# Patient Record
Sex: Female | Born: 1935 | Race: White | Hispanic: No | Smoking: Former smoker
Health system: Southern US, Community
[De-identification: ages and names within clinical notes are randomized; demographics above are authoritative.]

## PROBLEM LIST (undated history)

## (undated) DIAGNOSIS — R0600 Dyspnea, unspecified: Secondary | ICD-10-CM

## (undated) DIAGNOSIS — Z8489 Family history of other specified conditions: Secondary | ICD-10-CM

## (undated) DIAGNOSIS — J449 Chronic obstructive pulmonary disease, unspecified: Secondary | ICD-10-CM

## (undated) DIAGNOSIS — G5600 Carpal tunnel syndrome, unspecified upper limb: Secondary | ICD-10-CM

## (undated) DIAGNOSIS — E079 Disorder of thyroid, unspecified: Secondary | ICD-10-CM

## (undated) DIAGNOSIS — E871 Hypo-osmolality and hyponatremia: Secondary | ICD-10-CM

## (undated) DIAGNOSIS — M1990 Unspecified osteoarthritis, unspecified site: Secondary | ICD-10-CM

## (undated) DIAGNOSIS — L570 Actinic keratosis: Secondary | ICD-10-CM

## (undated) DIAGNOSIS — R202 Paresthesia of skin: Secondary | ICD-10-CM

## (undated) DIAGNOSIS — E039 Hypothyroidism, unspecified: Secondary | ICD-10-CM

## (undated) DIAGNOSIS — M316 Other giant cell arteritis: Secondary | ICD-10-CM

## (undated) DIAGNOSIS — I10 Essential (primary) hypertension: Secondary | ICD-10-CM

## (undated) DIAGNOSIS — Z87898 Personal history of other specified conditions: Secondary | ICD-10-CM

## (undated) DIAGNOSIS — J302 Other seasonal allergic rhinitis: Secondary | ICD-10-CM

## (undated) HISTORY — PX: NASAL POLYP SURGERY: SHX186

## (undated) HISTORY — PX: DILATION AND CURETTAGE OF UTERUS: SHX78

## (undated) HISTORY — PX: EYE SURGERY: SHX253

## (undated) HISTORY — DX: Actinic keratosis: L57.0

## (undated) HISTORY — PX: COLONOSCOPY: SHX174

## (undated) HISTORY — PX: TONSILLECTOMY: SUR1361

## (undated) HISTORY — PX: BREAST SURGERY: SHX581

## (undated) HISTORY — PX: APPENDECTOMY: SHX54

## (undated) HISTORY — PX: KNEE ARTHROSCOPY: SHX127

---
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
## 2004-11-24 ENCOUNTER — Ambulatory Visit: Payer: Self-pay

## 2004-12-02 ENCOUNTER — Ambulatory Visit: Payer: Self-pay

## 2005-07-18 ENCOUNTER — Ambulatory Visit: Payer: Self-pay | Admitting: Ophthalmology

## 2005-07-25 ENCOUNTER — Ambulatory Visit: Payer: Self-pay | Admitting: Ophthalmology

## 2005-11-25 ENCOUNTER — Ambulatory Visit: Payer: Self-pay

## 2005-12-13 ENCOUNTER — Ambulatory Visit: Payer: Self-pay

## 2006-06-14 ENCOUNTER — Ambulatory Visit: Payer: Self-pay | Admitting: General Surgery

## 2006-12-11 ENCOUNTER — Ambulatory Visit: Payer: Self-pay | Admitting: General Surgery

## 2007-04-14 ENCOUNTER — Ambulatory Visit: Payer: Self-pay | Admitting: General Practice

## 2007-05-10 ENCOUNTER — Ambulatory Visit: Payer: Self-pay | Admitting: Gastroenterology

## 2007-06-28 ENCOUNTER — Other Ambulatory Visit: Payer: Self-pay

## 2007-06-28 ENCOUNTER — Ambulatory Visit: Payer: Self-pay | Admitting: General Practice

## 2007-07-04 ENCOUNTER — Ambulatory Visit: Payer: Self-pay | Admitting: General Practice

## 2007-07-25 ENCOUNTER — Ambulatory Visit: Payer: Self-pay | Admitting: General Surgery

## 2008-01-23 ENCOUNTER — Ambulatory Visit: Payer: Self-pay | Admitting: General Surgery

## 2008-12-12 ENCOUNTER — Ambulatory Visit: Payer: Self-pay | Admitting: Ophthalmology

## 2008-12-22 ENCOUNTER — Ambulatory Visit: Payer: Self-pay | Admitting: Ophthalmology

## 2009-01-27 ENCOUNTER — Ambulatory Visit: Payer: Self-pay | Admitting: General Surgery

## 2009-03-04 ENCOUNTER — Ambulatory Visit: Payer: Self-pay

## 2009-07-17 ENCOUNTER — Encounter (INDEPENDENT_AMBULATORY_CARE_PROVIDER_SITE_OTHER): Payer: Self-pay | Admitting: *Deleted

## 2009-07-19 ENCOUNTER — Inpatient Hospital Stay: Payer: Self-pay | Admitting: Surgery

## 2010-04-01 ENCOUNTER — Ambulatory Visit: Payer: Self-pay | Admitting: Family Medicine

## 2010-09-24 ENCOUNTER — Ambulatory Visit: Payer: Self-pay | Admitting: Family Medicine

## 2011-06-09 ENCOUNTER — Ambulatory Visit: Payer: Self-pay | Admitting: Family Medicine

## 2012-05-08 ENCOUNTER — Ambulatory Visit: Payer: Self-pay | Admitting: Family Medicine

## 2012-12-25 ENCOUNTER — Ambulatory Visit: Payer: Self-pay | Admitting: Family Medicine

## 2013-09-27 ENCOUNTER — Ambulatory Visit: Payer: Self-pay | Admitting: Otolaryngology

## 2014-01-29 ENCOUNTER — Ambulatory Visit: Payer: Self-pay | Admitting: Family Medicine

## 2014-12-19 ENCOUNTER — Other Ambulatory Visit: Payer: Self-pay | Admitting: Family Medicine

## 2014-12-19 DIAGNOSIS — Z1231 Encounter for screening mammogram for malignant neoplasm of breast: Secondary | ICD-10-CM

## 2015-02-20 ENCOUNTER — Other Ambulatory Visit: Payer: Self-pay | Admitting: Family Medicine

## 2015-02-20 ENCOUNTER — Ambulatory Visit
Admission: RE | Admit: 2015-02-20 | Discharge: 2015-02-20 | Disposition: A | Discharge status: Home / Self Care | Payer: PPO | Source: Ambulatory Visit | Attending: Family Medicine | Admitting: Family Medicine

## 2015-02-20 DIAGNOSIS — Z1231 Encounter for screening mammogram for malignant neoplasm of breast: Secondary | ICD-10-CM

## 2015-09-21 DIAGNOSIS — E039 Hypothyroidism, unspecified: Secondary | ICD-10-CM | POA: Diagnosis not present

## 2015-09-21 DIAGNOSIS — I10 Essential (primary) hypertension: Secondary | ICD-10-CM | POA: Diagnosis not present

## 2015-09-21 DIAGNOSIS — M25511 Pain in right shoulder: Secondary | ICD-10-CM | POA: Diagnosis not present

## 2015-09-28 DIAGNOSIS — H02053 Trichiasis without entropian right eye, unspecified eyelid: Secondary | ICD-10-CM | POA: Diagnosis not present

## 2015-09-28 DIAGNOSIS — H1032 Unspecified acute conjunctivitis, left eye: Secondary | ICD-10-CM | POA: Diagnosis not present

## 2015-10-06 DIAGNOSIS — Z1283 Encounter for screening for malignant neoplasm of skin: Secondary | ICD-10-CM | POA: Diagnosis not present

## 2015-10-06 DIAGNOSIS — L570 Actinic keratosis: Secondary | ICD-10-CM | POA: Diagnosis not present

## 2015-10-06 DIAGNOSIS — L821 Other seborrheic keratosis: Secondary | ICD-10-CM | POA: Diagnosis not present

## 2015-10-06 DIAGNOSIS — C44321 Squamous cell carcinoma of skin of nose: Secondary | ICD-10-CM | POA: Diagnosis not present

## 2015-10-06 DIAGNOSIS — Z85828 Personal history of other malignant neoplasm of skin: Secondary | ICD-10-CM | POA: Diagnosis not present

## 2015-10-06 DIAGNOSIS — C44311 Basal cell carcinoma of skin of nose: Secondary | ICD-10-CM | POA: Diagnosis not present

## 2015-10-06 DIAGNOSIS — D1800 Hemangioma unspecified site: Secondary | ICD-10-CM | POA: Diagnosis not present

## 2015-10-06 DIAGNOSIS — L578 Other skin changes due to chronic exposure to nonionizing radiation: Secondary | ICD-10-CM | POA: Diagnosis not present

## 2015-10-06 DIAGNOSIS — L814 Other melanin hyperpigmentation: Secondary | ICD-10-CM | POA: Diagnosis not present

## 2015-10-06 DIAGNOSIS — D485 Neoplasm of uncertain behavior of skin: Secondary | ICD-10-CM | POA: Diagnosis not present

## 2015-10-06 DIAGNOSIS — L820 Inflamed seborrheic keratosis: Secondary | ICD-10-CM | POA: Diagnosis not present

## 2015-10-06 DIAGNOSIS — C4491 Basal cell carcinoma of skin, unspecified: Secondary | ICD-10-CM

## 2015-10-06 HISTORY — DX: Basal cell carcinoma of skin, unspecified: C44.91

## 2015-10-08 DIAGNOSIS — H01003 Unspecified blepharitis right eye, unspecified eyelid: Secondary | ICD-10-CM | POA: Diagnosis not present

## 2015-10-14 DIAGNOSIS — H353212 Exudative age-related macular degeneration, right eye, with inactive choroidal neovascularization: Secondary | ICD-10-CM | POA: Diagnosis not present

## 2015-10-14 DIAGNOSIS — H353122 Nonexudative age-related macular degeneration, left eye, intermediate dry stage: Secondary | ICD-10-CM | POA: Diagnosis not present

## 2015-11-30 DIAGNOSIS — N3946 Mixed incontinence: Secondary | ICD-10-CM | POA: Diagnosis not present

## 2015-11-30 DIAGNOSIS — R3915 Urgency of urination: Secondary | ICD-10-CM | POA: Diagnosis not present

## 2015-12-03 DIAGNOSIS — C44321 Squamous cell carcinoma of skin of nose: Secondary | ICD-10-CM | POA: Diagnosis not present

## 2015-12-16 DIAGNOSIS — H353212 Exudative age-related macular degeneration, right eye, with inactive choroidal neovascularization: Secondary | ICD-10-CM | POA: Diagnosis not present

## 2016-01-13 DIAGNOSIS — N3946 Mixed incontinence: Secondary | ICD-10-CM | POA: Diagnosis not present

## 2016-01-28 ENCOUNTER — Other Ambulatory Visit: Payer: Self-pay | Admitting: Family Medicine

## 2016-01-28 DIAGNOSIS — Z1231 Encounter for screening mammogram for malignant neoplasm of breast: Secondary | ICD-10-CM

## 2016-02-22 ENCOUNTER — Ambulatory Visit
Admission: RE | Admit: 2016-02-22 | Discharge: 2016-02-22 | Disposition: A | Discharge status: Home / Self Care | Payer: PPO | Source: Ambulatory Visit | Attending: Family Medicine | Admitting: Family Medicine

## 2016-02-22 ENCOUNTER — Other Ambulatory Visit: Payer: Self-pay | Admitting: Family Medicine

## 2016-02-22 DIAGNOSIS — Z1231 Encounter for screening mammogram for malignant neoplasm of breast: Secondary | ICD-10-CM

## 2016-02-25 DIAGNOSIS — N390 Urinary tract infection, site not specified: Secondary | ICD-10-CM | POA: Diagnosis not present

## 2016-02-26 DIAGNOSIS — R350 Frequency of micturition: Secondary | ICD-10-CM | POA: Diagnosis not present

## 2016-02-26 DIAGNOSIS — N3946 Mixed incontinence: Secondary | ICD-10-CM | POA: Diagnosis not present

## 2016-03-01 DIAGNOSIS — L821 Other seborrheic keratosis: Secondary | ICD-10-CM | POA: Diagnosis not present

## 2016-03-01 DIAGNOSIS — L578 Other skin changes due to chronic exposure to nonionizing radiation: Secondary | ICD-10-CM | POA: Diagnosis not present

## 2016-03-01 DIAGNOSIS — L570 Actinic keratosis: Secondary | ICD-10-CM | POA: Diagnosis not present

## 2016-03-01 DIAGNOSIS — Z85828 Personal history of other malignant neoplasm of skin: Secondary | ICD-10-CM | POA: Diagnosis not present

## 2016-03-11 DIAGNOSIS — D649 Anemia, unspecified: Secondary | ICD-10-CM | POA: Diagnosis not present

## 2016-03-18 DIAGNOSIS — H353122 Nonexudative age-related macular degeneration, left eye, intermediate dry stage: Secondary | ICD-10-CM | POA: Diagnosis not present

## 2016-03-18 DIAGNOSIS — H353211 Exudative age-related macular degeneration, right eye, with active choroidal neovascularization: Secondary | ICD-10-CM | POA: Diagnosis not present

## 2016-03-18 DIAGNOSIS — H02053 Trichiasis without entropian right eye, unspecified eyelid: Secondary | ICD-10-CM | POA: Diagnosis not present

## 2016-03-21 DIAGNOSIS — I10 Essential (primary) hypertension: Secondary | ICD-10-CM | POA: Diagnosis not present

## 2016-03-21 DIAGNOSIS — E039 Hypothyroidism, unspecified: Secondary | ICD-10-CM | POA: Diagnosis not present

## 2016-03-28 DIAGNOSIS — R0981 Nasal congestion: Secondary | ICD-10-CM | POA: Diagnosis not present

## 2016-03-28 DIAGNOSIS — Z0000 Encounter for general adult medical examination without abnormal findings: Secondary | ICD-10-CM | POA: Diagnosis not present

## 2016-03-28 DIAGNOSIS — M545 Low back pain: Secondary | ICD-10-CM | POA: Diagnosis not present

## 2016-03-28 DIAGNOSIS — I10 Essential (primary) hypertension: Secondary | ICD-10-CM | POA: Diagnosis not present

## 2016-04-07 DIAGNOSIS — R195 Other fecal abnormalities: Secondary | ICD-10-CM | POA: Diagnosis not present

## 2016-04-18 DIAGNOSIS — N3946 Mixed incontinence: Secondary | ICD-10-CM | POA: Diagnosis not present

## 2016-04-19 DIAGNOSIS — J301 Allergic rhinitis due to pollen: Secondary | ICD-10-CM | POA: Diagnosis not present

## 2016-04-19 DIAGNOSIS — H6123 Impacted cerumen, bilateral: Secondary | ICD-10-CM | POA: Diagnosis not present

## 2016-04-19 DIAGNOSIS — H903 Sensorineural hearing loss, bilateral: Secondary | ICD-10-CM | POA: Diagnosis not present

## 2016-05-06 DIAGNOSIS — R195 Other fecal abnormalities: Secondary | ICD-10-CM | POA: Diagnosis not present

## 2016-05-11 DIAGNOSIS — I10 Essential (primary) hypertension: Secondary | ICD-10-CM | POA: Diagnosis not present

## 2016-05-27 DIAGNOSIS — H353211 Exudative age-related macular degeneration, right eye, with active choroidal neovascularization: Secondary | ICD-10-CM | POA: Diagnosis not present

## 2016-05-31 DIAGNOSIS — K5900 Constipation, unspecified: Secondary | ICD-10-CM | POA: Diagnosis not present

## 2016-05-31 DIAGNOSIS — R198 Other specified symptoms and signs involving the digestive system and abdomen: Secondary | ICD-10-CM | POA: Diagnosis not present

## 2016-07-01 DIAGNOSIS — H353211 Exudative age-related macular degeneration, right eye, with active choroidal neovascularization: Secondary | ICD-10-CM | POA: Diagnosis not present

## 2016-07-14 DIAGNOSIS — R300 Dysuria: Secondary | ICD-10-CM | POA: Diagnosis not present

## 2016-08-05 DIAGNOSIS — H353211 Exudative age-related macular degeneration, right eye, with active choroidal neovascularization: Secondary | ICD-10-CM | POA: Diagnosis not present

## 2016-08-08 DIAGNOSIS — R42 Dizziness and giddiness: Secondary | ICD-10-CM | POA: Diagnosis not present

## 2016-08-08 DIAGNOSIS — H8111 Benign paroxysmal vertigo, right ear: Secondary | ICD-10-CM | POA: Diagnosis not present

## 2016-08-11 DIAGNOSIS — I10 Essential (primary) hypertension: Secondary | ICD-10-CM | POA: Diagnosis not present

## 2016-08-11 DIAGNOSIS — R42 Dizziness and giddiness: Secondary | ICD-10-CM | POA: Diagnosis not present

## 2016-08-15 DIAGNOSIS — H8111 Benign paroxysmal vertigo, right ear: Secondary | ICD-10-CM | POA: Diagnosis not present

## 2016-08-15 DIAGNOSIS — R278 Other lack of coordination: Secondary | ICD-10-CM | POA: Diagnosis not present

## 2016-08-15 DIAGNOSIS — R42 Dizziness and giddiness: Secondary | ICD-10-CM | POA: Diagnosis not present

## 2016-08-16 DIAGNOSIS — H8111 Benign paroxysmal vertigo, right ear: Secondary | ICD-10-CM | POA: Diagnosis not present

## 2016-08-16 DIAGNOSIS — R42 Dizziness and giddiness: Secondary | ICD-10-CM | POA: Diagnosis not present

## 2016-08-16 DIAGNOSIS — R278 Other lack of coordination: Secondary | ICD-10-CM | POA: Diagnosis not present

## 2016-08-18 DIAGNOSIS — R42 Dizziness and giddiness: Secondary | ICD-10-CM | POA: Diagnosis not present

## 2016-08-18 DIAGNOSIS — H8111 Benign paroxysmal vertigo, right ear: Secondary | ICD-10-CM | POA: Diagnosis not present

## 2016-08-18 DIAGNOSIS — R278 Other lack of coordination: Secondary | ICD-10-CM | POA: Diagnosis not present

## 2016-08-25 DIAGNOSIS — R278 Other lack of coordination: Secondary | ICD-10-CM | POA: Diagnosis not present

## 2016-08-25 DIAGNOSIS — H8111 Benign paroxysmal vertigo, right ear: Secondary | ICD-10-CM | POA: Diagnosis not present

## 2016-08-25 DIAGNOSIS — R42 Dizziness and giddiness: Secondary | ICD-10-CM | POA: Diagnosis not present

## 2016-09-08 DIAGNOSIS — R278 Other lack of coordination: Secondary | ICD-10-CM | POA: Diagnosis not present

## 2016-09-08 DIAGNOSIS — R42 Dizziness and giddiness: Secondary | ICD-10-CM | POA: Diagnosis not present

## 2016-09-08 DIAGNOSIS — H8111 Benign paroxysmal vertigo, right ear: Secondary | ICD-10-CM | POA: Diagnosis not present

## 2016-09-09 DIAGNOSIS — R42 Dizziness and giddiness: Secondary | ICD-10-CM | POA: Diagnosis not present

## 2016-09-12 DIAGNOSIS — L720 Epidermal cyst: Secondary | ICD-10-CM | POA: Diagnosis not present

## 2016-09-12 DIAGNOSIS — D1800 Hemangioma unspecified site: Secondary | ICD-10-CM | POA: Diagnosis not present

## 2016-09-12 DIAGNOSIS — L570 Actinic keratosis: Secondary | ICD-10-CM | POA: Diagnosis not present

## 2016-09-12 DIAGNOSIS — Z1283 Encounter for screening for malignant neoplasm of skin: Secondary | ICD-10-CM | POA: Diagnosis not present

## 2016-09-12 DIAGNOSIS — L821 Other seborrheic keratosis: Secondary | ICD-10-CM | POA: Diagnosis not present

## 2016-09-12 DIAGNOSIS — L578 Other skin changes due to chronic exposure to nonionizing radiation: Secondary | ICD-10-CM | POA: Diagnosis not present

## 2016-09-12 DIAGNOSIS — L820 Inflamed seborrheic keratosis: Secondary | ICD-10-CM | POA: Diagnosis not present

## 2016-09-12 DIAGNOSIS — Z85828 Personal history of other malignant neoplasm of skin: Secondary | ICD-10-CM | POA: Diagnosis not present

## 2016-09-15 DIAGNOSIS — R42 Dizziness and giddiness: Secondary | ICD-10-CM | POA: Diagnosis not present

## 2016-09-15 DIAGNOSIS — H8111 Benign paroxysmal vertigo, right ear: Secondary | ICD-10-CM | POA: Diagnosis not present

## 2016-09-15 DIAGNOSIS — R278 Other lack of coordination: Secondary | ICD-10-CM | POA: Diagnosis not present

## 2016-09-16 DIAGNOSIS — H353211 Exudative age-related macular degeneration, right eye, with active choroidal neovascularization: Secondary | ICD-10-CM | POA: Diagnosis not present

## 2016-09-28 DIAGNOSIS — H8111 Benign paroxysmal vertigo, right ear: Secondary | ICD-10-CM | POA: Diagnosis not present

## 2016-09-28 DIAGNOSIS — R278 Other lack of coordination: Secondary | ICD-10-CM | POA: Diagnosis not present

## 2016-09-28 DIAGNOSIS — R42 Dizziness and giddiness: Secondary | ICD-10-CM | POA: Diagnosis not present

## 2016-10-18 DIAGNOSIS — G5603 Carpal tunnel syndrome, bilateral upper limbs: Secondary | ICD-10-CM | POA: Diagnosis not present

## 2016-10-18 DIAGNOSIS — R300 Dysuria: Secondary | ICD-10-CM | POA: Diagnosis not present

## 2016-10-18 DIAGNOSIS — R42 Dizziness and giddiness: Secondary | ICD-10-CM | POA: Diagnosis not present

## 2016-10-28 DIAGNOSIS — H353211 Exudative age-related macular degeneration, right eye, with active choroidal neovascularization: Secondary | ICD-10-CM | POA: Diagnosis not present

## 2016-10-28 DIAGNOSIS — H353122 Nonexudative age-related macular degeneration, left eye, intermediate dry stage: Secondary | ICD-10-CM | POA: Diagnosis not present

## 2016-11-03 ENCOUNTER — Emergency Department: Payer: PPO

## 2016-11-03 ENCOUNTER — Emergency Department
Admission: EM | Admit: 2016-11-03 | Discharge: 2016-11-03 | Disposition: A | Discharge status: Home / Self Care | Payer: PPO | Attending: Student in an Organized Health Care Education/Training Program | Admitting: Student in an Organized Health Care Education/Training Program

## 2016-11-03 ENCOUNTER — Encounter: Payer: Self-pay | Admitting: Emergency Medicine

## 2016-11-03 DIAGNOSIS — S0990XA Unspecified injury of head, initial encounter: Secondary | ICD-10-CM | POA: Diagnosis not present

## 2016-11-03 DIAGNOSIS — T07XXXA Unspecified multiple injuries, initial encounter: Secondary | ICD-10-CM

## 2016-11-03 DIAGNOSIS — S12400A Unspecified displaced fracture of fifth cervical vertebra, initial encounter for closed fracture: Secondary | ICD-10-CM | POA: Diagnosis not present

## 2016-11-03 DIAGNOSIS — W01198A Fall on same level from slipping, tripping and stumbling with subsequent striking against other object, initial encounter: Secondary | ICD-10-CM | POA: Insufficient documentation

## 2016-11-03 DIAGNOSIS — Y999 Unspecified external cause status: Secondary | ICD-10-CM | POA: Diagnosis not present

## 2016-11-03 DIAGNOSIS — Y92481 Parking lot as the place of occurrence of the external cause: Secondary | ICD-10-CM | POA: Diagnosis not present

## 2016-11-03 DIAGNOSIS — Z7982 Long term (current) use of aspirin: Secondary | ICD-10-CM | POA: Diagnosis not present

## 2016-11-03 DIAGNOSIS — S81811A Laceration without foreign body, right lower leg, initial encounter: Secondary | ICD-10-CM | POA: Diagnosis not present

## 2016-11-03 DIAGNOSIS — I10 Essential (primary) hypertension: Secondary | ICD-10-CM | POA: Diagnosis not present

## 2016-11-03 DIAGNOSIS — S60041A Contusion of right ring finger without damage to nail, initial encounter: Secondary | ICD-10-CM | POA: Diagnosis not present

## 2016-11-03 DIAGNOSIS — Z87891 Personal history of nicotine dependence: Secondary | ICD-10-CM | POA: Diagnosis not present

## 2016-11-03 DIAGNOSIS — Z79899 Other long term (current) drug therapy: Secondary | ICD-10-CM | POA: Insufficient documentation

## 2016-11-03 DIAGNOSIS — S299XXA Unspecified injury of thorax, initial encounter: Secondary | ICD-10-CM | POA: Diagnosis not present

## 2016-11-03 DIAGNOSIS — S0512XA Contusion of eyeball and orbital tissues, left eye, initial encounter: Secondary | ICD-10-CM | POA: Diagnosis not present

## 2016-11-03 DIAGNOSIS — S0511XA Contusion of eyeball and orbital tissues, right eye, initial encounter: Secondary | ICD-10-CM | POA: Diagnosis not present

## 2016-11-03 DIAGNOSIS — Z23 Encounter for immunization: Secondary | ICD-10-CM | POA: Diagnosis not present

## 2016-11-03 DIAGNOSIS — S129XXA Fracture of neck, unspecified, initial encounter: Secondary | ICD-10-CM

## 2016-11-03 DIAGNOSIS — W19XXXA Unspecified fall, initial encounter: Secondary | ICD-10-CM

## 2016-11-03 DIAGNOSIS — S90121A Contusion of right lesser toe(s) without damage to nail, initial encounter: Secondary | ICD-10-CM | POA: Diagnosis not present

## 2016-11-03 DIAGNOSIS — Y939 Activity, unspecified: Secondary | ICD-10-CM | POA: Diagnosis not present

## 2016-11-03 DIAGNOSIS — S00511A Abrasion of lip, initial encounter: Secondary | ICD-10-CM | POA: Diagnosis not present

## 2016-11-03 DIAGNOSIS — R51 Headache: Secondary | ICD-10-CM | POA: Diagnosis not present

## 2016-11-03 DIAGNOSIS — S12100A Unspecified displaced fracture of second cervical vertebra, initial encounter for closed fracture: Secondary | ICD-10-CM | POA: Diagnosis not present

## 2016-11-03 HISTORY — DX: Essential (primary) hypertension: I10

## 2016-11-03 HISTORY — DX: Disorder of thyroid, unspecified: E07.9

## 2016-11-03 HISTORY — DX: Unspecified osteoarthritis, unspecified site: M19.90

## 2016-11-03 HISTORY — DX: Carpal tunnel syndrome, unspecified upper limb: G56.00

## 2016-11-03 MED ORDER — TETANUS-DIPHTH-ACELL PERTUSSIS 5-2.5-18.5 LF-MCG/0.5 IM SUSP
0.5000 mL | Freq: Once | INTRAMUSCULAR | Status: AC
  Administered 2016-11-03: 0.5 mL via INTRAMUSCULAR
  Filled 2016-11-03 (×2): qty 0.5

## 2016-11-03 MED ORDER — BACITRACIN ZINC 500 UNIT/GM EX OINT
TOPICAL_OINTMENT | Freq: Once | CUTANEOUS | Status: AC
  Administered 2016-11-03: 1 via TOPICAL
  Filled 2016-11-03: qty 0.9

## 2016-11-03 NOTE — Discharge Instructions (Signed)
Where c-collar for comfort and supoprt.  Return immediately should you develop any numbness or tingling or worsening pain.

## 2016-11-03 NOTE — ED Provider Notes (Signed)
[facility] Emergency Department Provider Note    First MD Initiated Contact with Patient 11/03/16 1418     (approximate)  I have reviewed the triage vital signs and the nursing notes.   HISTORY  Chief Complaint Fall    HPI Mary Barker is a 83 y.o. female presents after mechanical fall yesterday while stepping onto a twig and a [employer] parking lot with fall from standing onto her face hitting it on the proper of a car as well as injuring her right hand and knee. Denies any LOC. He did have some bleeding from her lip and nose. Also has swelling to her right hand. Denies any progressively worsening headache visual changes, chest pain or shortness of breath. Patient is not on any blood thinners. Presents today due to worsening swelling and ecchymosis around bilateral eyes. She is able to ambulate.   Past Medical History:  Diagnosis Date  . Carpal tunnel syndrome   . Hypertension   . Osteoarthritis   . Thyroid disease    No family history on file. Past Surgical History:  Procedure Laterality Date  . BREAST BIOPSY Left 1954   neg   There are no active problems to display for this patient.     Prior to Admission medications   Medication Sig Start Date End Date Taking? Authorizing Provider  aspirin 325 MG EC tablet Take 325 mg by mouth daily.   Yes Historical Provider, MD  atenolol (TENORMIN) 25 MG tablet Take 25 mg by mouth daily.   Yes Historical Provider, MD  losartan (COZAAR) 50 MG tablet Take 50 mg by mouth daily.   Yes Historical Provider, MD  tolterodine (DETROL LA) 4 MG 24 hr capsule Take 4 mg by mouth daily.   Yes Historical Provider, MD  triamterene-hydrochlorothiazide (MAXZIDE-25) 37.5-25 MG tablet Take 1 tablet by mouth daily.   Yes Historical Provider, MD    Allergies Codeine    Social History Social History  Substance Use Topics  . Smoking status: Former Research scientist (life sciences)  . Smokeless tobacco: Never Used  . Alcohol use Yes   Comment: pt states one drink a day    Review of Systems Patient denies headaches, rhinorrhea, blurry vision, numbness, shortness of breath, chest pain, edema, cough, abdominal pain, nausea, vomiting, diarrhea, dysuria, fevers, rashes or hallucinations unless otherwise stated above in HPI. ____________________________________________   PHYSICAL EXAM:  VITAL SIGNS: Vitals:   11/03/16 1406  BP: (!) 158/66  Pulse: 78  Resp: 18  Temp: 97.5 F (36.4 C)    Constitutional: Alert and oriented. Eyes: Conjunctivae are normal. PERRL. EOMI. Head:racoon eyes, no proptosis, no septal hematoma,  Small superficial abrasion to lip not involving [place] border.  No basilar skull ttp Nose: No congestion/rhinnorhea. Mouth/Throat: Mucous membranes are moist.  Oropharynx non-erythematous. Neck: No stridor. Painless ROM. No cervical spine tenderness to palpation Hematological/Lymphatic/Immunilogical: No cervical lymphadenopathy. Cardiovascular: Normal rate, regular rhythm. Grossly normal heart sounds.  Good peripheral circulation. Respiratory: Normal respiratory effort.  No retractions. Lungs CTAB. Gastrointestinal: Soft and nontender. No distention. No abdominal bruits. No CVA tenderness. Musculoskeletal: ttp and skin tear over right patella, painless ROM.  Pain and swelling to DIP of right 4th digit. Neurologic:  Normal speech and language. No gross focal neurologic deficits are appreciated. No gait instability. Skin:  Skin is warm, dry and intact. No rash noted.   ____________________________________________   LABS (all labs ordered are listed, but only abnormal results are displayed)  No results found for this or any previous  visit (from the past 24 hour(s)). ____________________________________________  ____________________________________________  [id number]  I personally reviewed all radiographic images ordered to evaluate for the above acute complaints and reviewed radiology reports  and findings.  These findings were personally discussed with the patient.  Please see medical record for radiology report.  ____________________________________________   PROCEDURES  Procedure(s) performed:  Procedures    Critical Care performed: no ____________________________________________   INITIAL IMPRESSION / ASSESSMENT AND PLAN / ED COURSE  Pertinent labs & imaging results that were available during my care of the patient were reviewed by me and considered in my medical decision making (see chart for details).  DDX: sah, sdh, edh, fracture, contusion, soft tissue injury, viscous injury, concussion, hemorrhage   Mary Barker is a 83 y.o. who presents to the ED with fall from standing yesterday with above noted injuries. CT imaging of the head and neck as well as radiographs ordered to evaluate for traumatic injury. Patient otherwise neuro and intact. In no acute distress. Not on any blood thinners.  Clinical Course as of Nov 03 1641  Thu Nov 03, 2016  1612 I with Dr. Catalina Pizza, [facility], regarding the patient's injury.  He states no indication for MRI at this time.  C-collar for comfort. Unlikely to require any intervention.    [PR]  1639 Right removed and givent to patient.    Knee abrasion bandaged.  Patient was able to tolerate PO and was able to ambulate with a steady gait. Have discussed with the patient and available family all diagnostics and treatments performed thus far and all questions were answered to the best of my ability. The patient demonstrates understanding and agreement with plan.   [PR]    Clinical Course User Index [PR] Merlyn Lot, MD     ____________________________________________   FINAL CLINICAL IMPRESSION(S) / ED DIAGNOSES  Final diagnoses:  Fall  Closed fracture of spinous process of cervical vertebra, initial encounter ([place])  Multiple contusions      NEW MEDICATIONS STARTED DURING THIS VISIT:  New Prescriptions   No  medications on file     Note:  This document was prepared using Dragon voice recognition software and may include unintentional dictation errors.    Merlyn Lot, MD 11/03/16 [phone]

## 2016-11-03 NOTE — ED Triage Notes (Signed)
Pt sent over from [facility]; pt reports slipping on a twig yesterday in [place] parking lot, landing face first into bumper of a car.  Pt with bruising to bilateral eyes, laceration to top lip, bruising and swelling to right 3rd and 4th fingers.  Pt denies any headache or changes in vision since the fall, does report some dizziness.  Pt denies any pain at this time.  Pt A/Ox4, NAD noted at this time.

## 2016-11-03 NOTE — ED Notes (Signed)
See triaged note  States she stepped on a twig yesterday then she fell forward  Bruising noted to face around eyes,  Bruising noted to right hand and 4 th finger small bruising noted to left knee   Abrasions to right knee  Ambulates well

## 2016-11-08 DIAGNOSIS — Z9181 History of falling: Secondary | ICD-10-CM | POA: Diagnosis not present

## 2016-11-08 DIAGNOSIS — S129XXS Fracture of neck, unspecified, sequela: Secondary | ICD-10-CM | POA: Diagnosis not present

## 2016-11-08 DIAGNOSIS — T7840XA Allergy, unspecified, initial encounter: Secondary | ICD-10-CM | POA: Diagnosis not present

## 2016-11-15 DIAGNOSIS — G5603 Carpal tunnel syndrome, bilateral upper limbs: Secondary | ICD-10-CM | POA: Diagnosis not present

## 2016-11-23 DIAGNOSIS — J40 Bronchitis, not specified as acute or chronic: Secondary | ICD-10-CM | POA: Diagnosis not present

## 2016-11-28 ENCOUNTER — Encounter: Payer: Self-pay | Admitting: *Deleted

## 2016-11-28 ENCOUNTER — Emergency Department
Admission: EM | Admit: 2016-11-28 | Discharge: 2016-11-28 | Disposition: A | Discharge status: Home / Self Care | Payer: PPO | Attending: Student in an Organized Health Care Education/Training Program | Admitting: Student in an Organized Health Care Education/Training Program

## 2016-11-28 ENCOUNTER — Emergency Department: Payer: PPO

## 2016-11-28 DIAGNOSIS — Z5321 Procedure and treatment not carried out due to patient leaving prior to being seen by health care provider: Secondary | ICD-10-CM | POA: Diagnosis not present

## 2016-11-28 DIAGNOSIS — Z7982 Long term (current) use of aspirin: Secondary | ICD-10-CM | POA: Diagnosis not present

## 2016-11-28 DIAGNOSIS — I10 Essential (primary) hypertension: Secondary | ICD-10-CM | POA: Diagnosis not present

## 2016-11-28 DIAGNOSIS — R0602 Shortness of breath: Secondary | ICD-10-CM | POA: Diagnosis not present

## 2016-11-28 DIAGNOSIS — Z87891 Personal history of nicotine dependence: Secondary | ICD-10-CM | POA: Insufficient documentation

## 2016-11-28 LAB — CBC WITH DIFFERENTIAL/PLATELET
BASOS PCT: 0 %
Basophils Absolute: 0 10*3/uL (ref 0–0.1)
EOS ABS: 0.1 10*3/uL (ref 0–0.7)
EOS PCT: 1 %
HCT: 40.5 % (ref 35.0–47.0)
HEMOGLOBIN: 14 g/dL (ref 12.0–16.0)
Lymphocytes Relative: 13 %
Lymphs Abs: 0.8 10*3/uL — ABNORMAL LOW (ref 1.0–3.6)
MCH: 31.2 pg (ref 26.0–34.0)
MCHC: 34.5 g/dL (ref 32.0–36.0)
MCV: 90.2 fL (ref 80.0–100.0)
MONOS PCT: 9 %
Monocytes Absolute: 0.5 10*3/uL (ref 0.2–0.9)
NEUTROS PCT: 77 %
Neutro Abs: 4.3 10*3/uL (ref 1.4–6.5)
PLATELETS: 190 10*3/uL (ref 150–440)
RBC: 4.48 MIL/uL (ref 3.80–5.20)
RDW: 13.5 % (ref 11.5–14.5)
WBC: 5.7 10*3/uL (ref 3.6–11.0)

## 2016-11-28 LAB — COMPREHENSIVE METABOLIC PANEL
ALT: 21 U/L (ref 14–54)
ANION GAP: 9 (ref 5–15)
AST: 26 U/L (ref 15–41)
Albumin: 4.1 g/dL (ref 3.5–5.0)
Alkaline Phosphatase: 66 U/L (ref 38–126)
BUN: 11 mg/dL (ref 6–20)
CHLORIDE: 92 mmol/L — AB (ref 101–111)
CO2: 26 mmol/L (ref 22–32)
CREATININE: 0.64 mg/dL (ref 0.44–1.00)
Calcium: 9.2 mg/dL (ref 8.9–10.3)
Glucose, Bld: 125 mg/dL — ABNORMAL HIGH (ref 65–99)
POTASSIUM: 3.8 mmol/L (ref 3.5–5.1)
SODIUM: 127 mmol/L — AB (ref 135–145)
TOTAL PROTEIN: 7.4 g/dL (ref 6.5–8.1)
Total Bilirubin: 0.6 mg/dL (ref 0.3–1.2)

## 2016-11-28 LAB — TROPONIN I

## 2016-11-28 NOTE — ED Triage Notes (Signed)
Pt to triage via wheelchair. Pt sent from [place] for eval of sob.  No chest pain.  Pt has a cough for 1 week.  No fever.   Pt alert.

## 2016-12-04 ENCOUNTER — Encounter: Payer: Self-pay | Admitting: Emergency Medicine

## 2016-12-04 ENCOUNTER — Inpatient Hospital Stay
Admission: EM | Admit: 2016-12-04 | Discharge: 2016-12-06 | DRG: 194 | Disposition: A | Discharge status: Home Health | Payer: PPO | Attending: Internal Medicine | Admitting: Internal Medicine

## 2016-12-04 ENCOUNTER — Emergency Department: Payer: PPO

## 2016-12-04 DIAGNOSIS — Z885 Allergy status to narcotic agent status: Secondary | ICD-10-CM

## 2016-12-04 DIAGNOSIS — I10 Essential (primary) hypertension: Secondary | ICD-10-CM | POA: Diagnosis present

## 2016-12-04 DIAGNOSIS — Z7982 Long term (current) use of aspirin: Secondary | ICD-10-CM | POA: Diagnosis not present

## 2016-12-04 DIAGNOSIS — J189 Pneumonia, unspecified organism: Secondary | ICD-10-CM | POA: Diagnosis not present

## 2016-12-04 DIAGNOSIS — R0602 Shortness of breath: Secondary | ICD-10-CM

## 2016-12-04 DIAGNOSIS — Z87891 Personal history of nicotine dependence: Secondary | ICD-10-CM | POA: Diagnosis not present

## 2016-12-04 DIAGNOSIS — M1990 Unspecified osteoarthritis, unspecified site: Secondary | ICD-10-CM | POA: Diagnosis present

## 2016-12-04 DIAGNOSIS — E876 Hypokalemia: Secondary | ICD-10-CM | POA: Diagnosis not present

## 2016-12-04 DIAGNOSIS — E039 Hypothyroidism, unspecified: Secondary | ICD-10-CM | POA: Diagnosis present

## 2016-12-04 DIAGNOSIS — Z79899 Other long term (current) drug therapy: Secondary | ICD-10-CM

## 2016-12-04 DIAGNOSIS — Z8249 Family history of ischemic heart disease and other diseases of the circulatory system: Secondary | ICD-10-CM

## 2016-12-04 DIAGNOSIS — E871 Hypo-osmolality and hyponatremia: Secondary | ICD-10-CM | POA: Diagnosis present

## 2016-12-04 DIAGNOSIS — J181 Lobar pneumonia, unspecified organism: Secondary | ICD-10-CM

## 2016-12-04 LAB — CBC
HEMATOCRIT: 43.2 % (ref 35.0–47.0)
HEMOGLOBIN: 14.9 g/dL (ref 12.0–16.0)
MCH: 31 pg (ref 26.0–34.0)
MCHC: 34.5 g/dL (ref 32.0–36.0)
MCV: 89.6 fL (ref 80.0–100.0)
Platelets: 351 10*3/uL (ref 150–440)
RBC: 4.82 MIL/uL (ref 3.80–5.20)
RDW: 13.7 % (ref 11.5–14.5)
WBC: 13 10*3/uL — AB (ref 3.6–11.0)

## 2016-12-04 LAB — BASIC METABOLIC PANEL
ANION GAP: 13 (ref 5–15)
BUN: 19 mg/dL (ref 6–20)
CO2: 21 mmol/L — ABNORMAL LOW (ref 22–32)
Calcium: 9.4 mg/dL (ref 8.9–10.3)
Chloride: 93 mmol/L — ABNORMAL LOW (ref 101–111)
Creatinine, Ser: 0.9 mg/dL (ref 0.44–1.00)
GFR, EST NON AFRICAN AMERICAN: 57 mL/min — AB (ref >60)
Glucose, Bld: 177 mg/dL — ABNORMAL HIGH (ref 65–99)
Potassium: 3.1 mmol/L — ABNORMAL LOW (ref 3.5–5.1)
SODIUM: 127 mmol/L — AB (ref 135–145)

## 2016-12-04 LAB — URINALYSIS, COMPLETE (UACMP) WITH MICROSCOPIC
BACTERIA UA: NONE SEEN
BILIRUBIN URINE: NEGATIVE
GLUCOSE, UA: NEGATIVE mg/dL
Hgb urine dipstick: NEGATIVE
KETONES UR: NEGATIVE mg/dL
LEUKOCYTES UA: NEGATIVE
NITRITE: NEGATIVE
Protein, ur: NEGATIVE mg/dL
Specific Gravity, Urine: 1.008 (ref 1.005–1.030)
Squamous Epithelial / LPF: NONE SEEN
pH: 7 (ref 5.0–8.0)

## 2016-12-04 LAB — TROPONIN I: Troponin I: <0.03 ng/mL (ref <0.03)

## 2016-12-04 LAB — INFLUENZA PANEL BY PCR (TYPE A & B)
INFLBPCR: NEGATIVE
Influenza A By PCR: NEGATIVE

## 2016-12-04 LAB — MRSA PCR SCREENING: MRSA BY PCR: NEGATIVE

## 2016-12-04 MED ORDER — HEPARIN SODIUM (PORCINE) 5000 UNIT/ML IJ SOLN
5000.0000 [IU] | Freq: Three times a day (TID) | INTRAMUSCULAR | Status: DC
  Administered 2016-12-04 – 2016-12-06 (×6): 5000 [IU] via SUBCUTANEOUS
  Filled 2016-12-04 (×6): qty 1

## 2016-12-04 MED ORDER — CEFTRIAXONE SODIUM-DEXTROSE 1-3.74 GM-% IV SOLR: 1.0000 g | Freq: Once | INTRAVENOUS | Status: DC

## 2016-12-04 MED ORDER — DEXTROSE 5 % IV SOLN: 1.0000 g | INTRAVENOUS | Status: DC

## 2016-12-04 MED ORDER — LEVOFLOXACIN IN D5W 750 MG/150ML IV SOLN
750.0000 mg | Freq: Once | INTRAVENOUS | Status: AC
  Administered 2016-12-04: 750 mg via INTRAVENOUS
  Filled 2016-12-04: qty 150

## 2016-12-04 MED ORDER — FESOTERODINE FUMARATE ER 4 MG PO TB24
4.0000 mg | ORAL_TABLET | Freq: Every day | ORAL | Status: DC
  Administered 2016-12-05 – 2016-12-06 (×2): 4 mg via ORAL
  Filled 2016-12-04 (×2): qty 1

## 2016-12-04 MED ORDER — ASPIRIN 325 MG PO TABS
325.0000 mg | ORAL_TABLET | Freq: Every day | ORAL | Status: DC
  Administered 2016-12-04 – 2016-12-05 (×2): 325 mg via ORAL
  Filled 2016-12-04 (×2): qty 1

## 2016-12-04 MED ORDER — LOSARTAN POTASSIUM 50 MG PO TABS
50.0000 mg | ORAL_TABLET | Freq: Every day | ORAL | Status: DC
  Administered 2016-12-05 – 2016-12-06 (×2): 50 mg via ORAL
  Filled 2016-12-04 (×2): qty 1

## 2016-12-04 MED ORDER — DOCUSATE SODIUM 100 MG PO CAPS
100.0000 mg | ORAL_CAPSULE | Freq: Two times a day (BID) | ORAL | Status: DC | PRN
  Administered 2016-12-06: 100 mg via ORAL
  Filled 2016-12-04: qty 1

## 2016-12-04 MED ORDER — LEVOTHYROXINE SODIUM 50 MCG PO TABS
125.0000 ug | ORAL_TABLET | Freq: Every day | ORAL | Status: DC
  Administered 2016-12-05 – 2016-12-06 (×2): 125 ug via ORAL
  Filled 2016-12-04 (×2): qty 3

## 2016-12-04 MED ORDER — DEXTROSE 5 % IV SOLN
1.0000 g | INTRAVENOUS | Status: DC
  Administered 2016-12-05: 1 g via INTRAVENOUS
  Filled 2016-12-04 (×2): qty 10

## 2016-12-04 MED ORDER — ATENOLOL 25 MG PO TABS
25.0000 mg | ORAL_TABLET | Freq: Every day | ORAL | Status: DC
  Administered 2016-12-04 – 2016-12-05 (×2): 25 mg via ORAL
  Filled 2016-12-04 (×2): qty 1

## 2016-12-04 MED ORDER — CALCIUM CARBONATE-VITAMIN D 500-200 MG-UNIT PO TABS
1.0000 | ORAL_TABLET | Freq: Every day | ORAL | Status: DC
  Administered 2016-12-05 – 2016-12-06 (×2): 1 via ORAL
  Filled 2016-12-04 (×2): qty 1

## 2016-12-04 MED ORDER — DEXTROSE 5 % IV SOLN
250.0000 mg | INTRAVENOUS | Status: DC
  Administered 2016-12-05: 250 mg via INTRAVENOUS
  Filled 2016-12-04 (×2): qty 250

## 2016-12-04 NOTE — H&P (Signed)
[facility] [place] NAME: Mary Barker    MR#:  [id number]  DATE OF BIRTH:  10-05-1929  DATE OF ADMISSION:  12/04/2016  PRIMARY CARE PHYSICIAN: Maryland Pink, MD   REQUESTING/REFERRING PHYSICIAN: Paduchowski  CHIEF COMPLAINT:   Chief Complaint  Patient presents with  . Weakness  . Shortness of Breath    HISTORY OF PRESENT ILLNESS: Mary Barker  is a 83 y.o. female with a known history of Htn, Thyroid disease- had bronchitis and received Amoxicilline by PMD 10 days ago, still cont to get worse, came to ER 5 days ago- was given oral steroids, no help, so came back to ER. Have generalized weakness. Noted to have Pneumonia in ER, denies to have sputum production or fever.  PAST MEDICAL HISTORY:   Past Medical History:  Diagnosis Date  . Carpal tunnel syndrome   . Hypertension   . Osteoarthritis   . Thyroid disease     PAST SURGICAL HISTORY: Past Surgical History:  Procedure Laterality Date  . BREAST BIOPSY Left 1954   neg    SOCIAL HISTORY:  Social History  Substance Use Topics  . Smoking status: Former Research scientist (life sciences)  . Smokeless tobacco: Never Used  . Alcohol use Yes     Comment: pt states one drink a day    FAMILY HISTORY:  Family History  Problem Relation Age of Onset  . CAD Father   . CAD Sister   . CAD Brother     DRUG ALLERGIES:  Allergies  Allergen Reactions  . Codeine Nausea And Vomiting    REVIEW OF SYSTEMS:   CONSTITUTIONAL: No fever, positive for fatigue or weakness.  EYES: No blurred or double vision.  EARS, NOSE, AND THROAT: No tinnitus or ear pain.  RESPIRATORY: positive for cough, shortness of breath, no wheezing or hemoptysis.  CARDIOVASCULAR: No chest pain, orthopnea, edema.  GASTROINTESTINAL: No nausea, vomiting, diarrhea or abdominal pain.  GENITOURINARY: No dysuria, hematuria.  ENDOCRINE: No polyuria, nocturia,  HEMATOLOGY: No anemia, easy bruising or bleeding SKIN: No rash or  lesion. MUSCULOSKELETAL: No joint pain or arthritis.   NEUROLOGIC: No tingling, numbness, weakness.  PSYCHIATRY: No anxiety or depression.   MEDICATIONS AT HOME:  Prior to Admission medications   Medication Sig Start Date End Date Taking? Authorizing Provider  aspirin 81 MG tablet Take 325 mg by mouth daily.   Yes Historical Provider, MD  atenolol (TENORMIN) 25 MG tablet Take 25 mg by mouth daily.   Yes Historical Provider, MD  Calcium Carbonate-Vitamin D (CALCIUM 600+D) 600-200 MG-UNIT TABS Take 2 tablets by mouth daily.   Yes Historical Provider, MD  levothyroxine (SYNTHROID, LEVOTHROID) 125 MCG tablet Take 1 tablet by mouth daily. 10/13/16  Yes Historical Provider, MD  losartan (COZAAR) 50 MG tablet Take 50 mg by mouth daily.   Yes Historical Provider, MD  predniSONE (DELTASONE) 5 MG tablet Take 1 tablet by mouth daily. 11/29/16 12/08/16 Yes Historical Provider, MD  tolterodine (DETROL LA) 4 MG 24 hr capsule Take 4 mg by mouth daily.   Yes Historical Provider, MD  triamterene-hydrochlorothiazide (MAXZIDE-25) 37.5-25 MG tablet Take 1 tablet by mouth daily.   Yes Historical Provider, MD      PHYSICAL EXAMINATION:   VITAL SIGNS: Blood pressure (!) 162/75, pulse 88, temperature 97.9 F (36.6 C), temperature source Oral, resp. rate 14, SpO2 98 %.  GENERAL:  83 y.o.-year-old patient lying in the bed with no acute distress.  EYES: Pupils equal, round, reactive to light  and accommodation. No scleral icterus. Extraocular muscles intact.  HEENT: Head atraumatic, normocephalic. Oropharynx and nasopharynx clear.  NECK:  Supple, no jugular venous distention. No thyroid enlargement, no tenderness.  LUNGS: Normal breath sounds bilaterally, no wheezing, some crepitation. No use of accessory muscles of respiration.  CARDIOVASCULAR: S1, S2 normal. No murmurs, rubs, or gallops.  ABDOMEN: Soft, nontender, nondistended. Bowel sounds present. No organomegaly or mass.  EXTREMITIES: No pedal edema, cyanosis, or  clubbing.  NEUROLOGIC: Cranial nerves II through XII are intact. Muscle strength 5/5 in all extremities. Sensation intact. Gait not checked.  PSYCHIATRIC: The patient is alert and oriented x 3.  SKIN: No obvious rash, lesion, or ulcer.   LABORATORY PANEL:   CBC  Recent Labs Lab 11/28/16 1622 12/04/16 1401  WBC 5.7 13.0*  HGB 14.0 14.9  HCT 40.5 43.2  PLT 190 351  MCV 90.2 89.6  MCH 31.2 31.0  MCHC 34.5 34.5  RDW 13.5 13.7  LYMPHSABS 0.8*  --   MONOABS 0.5  --   EOSABS 0.1  --   BASOSABS 0.0  --    ------------------------------------------------------------------------------------------------------------------  Chemistries   Recent Labs Lab 11/28/16 1622 12/04/16 1401  NA 127* 127*  K 3.8 3.1*  CL 92* 93*  CO2 26 21*  GLUCOSE 125* 177*  BUN 11 19  CREATININE 0.64 0.90  CALCIUM 9.2 9.4  AST 26  --   ALT 21  --   ALKPHOS 66  --   BILITOT 0.6  --    ------------------------------------------------------------------------------------------------------------------ estimated creatinine clearance is 36.1 mL/min (by C-G formula based on SCr of 0.9 mg/dL). ------------------------------------------------------------------------------------------------------------------ No results for input(s): TSH, T4TOTAL, T3FREE, THYROIDAB in the last 72 hours.  Invalid input(s): FREET3   Coagulation profile No results for input(s): INR, PROTIME in the last 168 hours. ------------------------------------------------------------------------------------------------------------------- No results for input(s): DDIMER in the last 72 hours. -------------------------------------------------------------------------------------------------------------------  Cardiac Enzymes  Recent Labs Lab 11/28/16 1622 12/04/16 1401  TROPONINI <0.03 <0.03   ------------------------------------------------------------------------------------------------------------------ Invalid input(s):  POCBNP  ---------------------------------------------------------------------------------------------------------------  Urinalysis No results found for: COLORURINE, APPEARANCEUR, LABSPEC, PHURINE, GLUCOSEU, HGBUR, BILIRUBINUR, KETONESUR, PROTEINUR, UROBILINOGEN, NITRITE, LEUKOCYTESUR   RADIOLOGY: Dg Chest 2 View  Result Date: 12/04/2016 CLINICAL DATA:  Initial evaluation for acute shortness of breath for 1 month, progressively worsening. EXAM: CHEST  2 VIEW COMPARISON:  Prior radiograph 11/28/2016. FINDINGS: Cardiac and mediastinal silhouettes are stable in size and contour, and remain within normal limits. Atherosclerotic plaque present within aortic arch. Lungs normally inflated. Calcified granuloma noted within the right middle lobe, stable. Question of a hazy developing density at the medial right lung base, which may reflect a small focus of infectious pneumonitis in the correct clinical setting. Atelectatic changes could also be considered. This is not definite seen on previous. No other focal infiltrates. No pulmonary edema or pleural effusion. No pneumothorax. No acute osseus abnormality appear multilevel degenerative spurring noted within the visualized spine. IMPRESSION: 1. Subtle hazy density within the medial right lung base, not definite seen on prior, and may reflect a small focus of infectious pneumonitis or possibly atelectatic changes. 2. Otherwise stable appearance of the chest. No other active cardiopulmonary disease. 3. Aortic atherosclerosis. Electronically Signed   By: Jeannine Boga M.D.   On: 12/04/2016 15:51    EKG: Orders placed or performed during the hospital encounter of 12/04/16  . ED EKG  . ED EKG  . EKG 12-Lead  . EKG 12-Lead    IMPRESSION AND PLAN:  * Pneumonia   IV rocephin + zithromax,  Try to get sputum cx.   Check for Flu.  * Hypertension   Cont atenolol.  * Hypothyroidism   Cont levothyroxine  All the records are reviewed and case  discussed with ED provider. Management plans discussed with the patient, family and they are in agreement.  CODE STATUS: Full code. Code Status History    This patient does not have a recorded code status. Please follow your organizational policy for patients in this situation.    Advance Directive Documentation     Most Recent Value  Type of Advance Directive  Living will  Pre-existing out of facility DNR order (yellow form or pink MOST form)  -  "MOST" Form in Place?  -     Discussed with her son and sister in room.  TOTAL TIME TAKING CARE OF THIS PATIENT: 50 minutes.    Vaughan Basta M.D on 12/04/2016   Between 7am to 6pm - Pager - [phone]  After 6pm go to [email or web address] - password [email or web address] [facility]  Office  [phone]  CC: Primary care physician; [place] Pink, MD   Note: This dictation was prepared with Dragon dictation along with smaller phrase technology. Any transcriptional errors that result from this process are unintentional.

## 2016-12-04 NOTE — ED Triage Notes (Signed)
Pt reports shortness of breath upon exertion and weakness; was seen here Tuesday for the same symptoms.

## 2016-12-04 NOTE — ED Notes (Signed)
Patient ambulated with assistance in hallway while monitoring pulse ox.  Patient stated feeling SOB but maintained oxygen level of 95% and higher, patient oxygen level dropped to 86% on RA once returning to the bed and sitting.  MD notified.

## 2016-12-04 NOTE — ED Notes (Signed)
Admitting MD at bedside.

## 2016-12-04 NOTE — ED Notes (Signed)
AAOx3.  Anxious. Skin warm and dry. Respirations regular and non labored.  Discussed wait time. Reassurance given with minimal effect.

## 2016-12-04 NOTE — ED Notes (Signed)
Pt and family member upset in triage room, states "we waited here in this place 7 hours the other night and we will not be doing that tonight, someone needs to give [place] our results as soon as they come back, today is a holiday, even more reason why I don't want to be here". This RN explained to patient that we could not give out results to patients before they are seen by the doctor and that there is a wait right now in the ED to be seen due to a very high number of sick patients.

## 2016-12-04 NOTE — ED Triage Notes (Signed)
AAOx3.  Skin warm and dry.  C/O worsening SOB and DOE.  No SOB/ DOE noted.  Currently taking Prednisone, completed amoxicillin coarse.

## 2016-12-04 NOTE — ED Provider Notes (Signed)
[facility] Emergency Department Provider Note  Time seen: 5:42 PM  I have reviewed the triage vital signs and the nursing notes.   HISTORY  Chief Complaint Weakness and Shortness of Breath    HPI Mary Barker is a 83 y.o. female with a past medical history of hypertension presents to the emergency department with generalized weakness and fatigue. According to the patient for the past 1 week she has been feeling extremely weak and short of breath. States the shortness of breath worsens with minimal exertion. Denies any fever, does state occasional cough denies congestion. Denies abdominal pain nausea vomiting or diarrhea. Denies dysuria. Patient states she feels extremely weak and is having a difficult time getting around her house due to the generalized weakness.  Past Medical History:  Diagnosis Date  . Carpal tunnel syndrome   . Hypertension   . Osteoarthritis   . Thyroid disease     There are no active problems to display for this patient.   Past Surgical History:  Procedure Laterality Date  . BREAST BIOPSY Left 1954   neg    Prior to Admission medications   Medication Sig Start Date End Date Taking? Authorizing Provider  aspirin 325 MG EC tablet Take 325 mg by mouth daily.    Historical Provider, MD  atenolol (TENORMIN) 25 MG tablet Take 25 mg by mouth daily.    Historical Provider, MD  losartan (COZAAR) 50 MG tablet Take 50 mg by mouth daily.    Historical Provider, MD  tolterodine (DETROL LA) 4 MG 24 hr capsule Take 4 mg by mouth daily.    Historical Provider, MD  triamterene-hydrochlorothiazide (MAXZIDE-25) 37.5-25 MG tablet Take 1 tablet by mouth daily.    Historical Provider, MD    Allergies  Allergen Reactions  . Codeine Nausea And Vomiting    No family history on file.  Social History Social History  Substance Use Topics  . Smoking status: Former Research scientist (life sciences)  . Smokeless tobacco: Never Used  . Alcohol use Yes     Comment: pt  states one drink a day    Review of Systems Constitutional: Negative for fever. Positive for chills. Cardiovascular: Negative for chest pain. Respiratory: Positive for shortness of breath worse with exertion. Positive for cough. Gastrointestinal: Negative for abdominal pain Neurological: Negative for headache 10-point ROS otherwise negative.  ____________________________________________   PHYSICAL EXAM:  Constitutional: Alert and oriented. Well appearing and in no distress. Eyes: Normal exam ENT   Head: Normocephalic and atraumatic.   Mouth/Throat: Mucous membranes are moist. Cardiovascular: Normal rate, regular rhythm. Respiratory: Normal respiratory effort without tachypnea nor retractions. Mild expiratory wheeze in the right lung fields. No rales or rhonchi. Frequent cough during examination. Gastrointestinal: Soft and nontender. No distention.  Musculoskeletal: Nontender with normal range of motion in all extremities.  Neurologic:  Normal speech and language. No gross focal neurologic deficits  Skin:  Skin is warm, dry and intact.  Psychiatric: Mood and affect are normal.   ____________________________________________    EKG  EKG reviewed and interpreted by myself shows normal sinus rhythm at 84 bpm, narrow QRS, normal axis, normal intervals, no concerning ST changes.  ____________________________________________    RADIOLOGY  Chest x-ray shows hazy density in the right lung base possible pneumonitis.  ____________________________________________   INITIAL IMPRESSION / ASSESSMENT AND PLAN / ED COURSE  Pertinent labs & imaging results that were available during my care of the patient were reviewed by me and considered in my medical decision making (see  chart for details).  Patient says emergency department 1 week of generalized weakness with cough and dyspnea on exertion. Patient's chest x-ray shows possible right-sided pneumonia. Patient has right wheeze  on exam. Patient's lab work shows a normal white blood cell count. Patient does have hyponatremia but appears to be largely unchanged from baseline sodium when compared to her care everywhere chart. We will ambulate the patient with pulse oximeter.  Patient desaturates to 86% with ambulation. Given the patient's new right lower lobe pneumonia with hypoxia on ambulation we will start the patient on IV antibiotics, send blood cultures and admit to the hospital for further treatment.  ____________________________________________   FINAL CLINICAL IMPRESSION(S) / ED DIAGNOSES  Generalized weakness Hyponatremia Pneumonia    Harvest Dark, MD 12/04/16 [phone]

## 2016-12-05 LAB — BASIC METABOLIC PANEL
Anion gap: 10 (ref 5–15)
BUN: 15 mg/dL (ref 6–20)
CO2: 24 mmol/L (ref 22–32)
Calcium: 8.8 mg/dL — ABNORMAL LOW (ref 8.9–10.3)
Chloride: 94 mmol/L — ABNORMAL LOW (ref 101–111)
Creatinine, Ser: 0.56 mg/dL (ref 0.44–1.00)
GFR calc non Af Amer: >60 mL/min (ref >60)
Glucose, Bld: 98 mg/dL (ref 65–99)
Potassium: 3 mmol/L — ABNORMAL LOW (ref 3.5–5.1)
SODIUM: 128 mmol/L — AB (ref 135–145)

## 2016-12-05 LAB — CBC
HCT: 39 % (ref 35.0–47.0)
Hemoglobin: 13.4 g/dL (ref 12.0–16.0)
MCH: 30.9 pg (ref 26.0–34.0)
MCHC: 34.2 g/dL (ref 32.0–36.0)
MCV: 90.4 fL (ref 80.0–100.0)
PLATELETS: 311 10*3/uL (ref 150–440)
RBC: 4.32 MIL/uL (ref 3.80–5.20)
RDW: 13.9 % (ref 11.5–14.5)
WBC: 9.9 10*3/uL (ref 3.6–11.0)

## 2016-12-05 LAB — MAGNESIUM: MAGNESIUM: 1.7 mg/dL (ref 1.7–2.4)

## 2016-12-05 MED ORDER — POTASSIUM CHLORIDE CRYS ER 20 MEQ PO TBCR
40.0000 meq | EXTENDED_RELEASE_TABLET | Freq: Once | ORAL | Status: AC
  Administered 2016-12-05: 40 meq via ORAL
  Filled 2016-12-05: qty 2

## 2016-12-05 MED ORDER — ENSURE ENLIVE PO LIQD
237.0000 mL | Freq: Two times a day (BID) | ORAL | Status: DC
  Administered 2016-12-05 – 2016-12-06 (×2): 237 mL via ORAL

## 2016-12-05 MED ORDER — POTASSIUM CHLORIDE IN NACL 20-0.9 MEQ/L-% IV SOLN
INTRAVENOUS | Status: DC
  Administered 2016-12-05 – 2016-12-06 (×3): via INTRAVENOUS
  Filled 2016-12-05 (×4): qty 1000

## 2016-12-05 NOTE — Progress Notes (Signed)
Initial Nutrition Assessment  DOCUMENTATION CODES:   Not applicable  INTERVENTION:  1. Ensure Enlive po BID, each supplement provides 350 kcal and 20 grams of protein] 2. Recommend liberalize diet from heart healthy.  NUTRITION DIAGNOSIS:   Inadequate oral intake related to poor appetite as evidenced by per patient/family report.  GOAL:   Patient will meet greater than or equal to 90% of their needs  MONITOR:   PO intake, I & O's, Labs, Weight trends, Supplement acceptance  REASON FOR ASSESSMENT:   Malnutrition Screening Tool    ASSESSMENT:   Mary Barker  is a 83 y.o. female with a known history of Htn, Thyroid disease- had bronchitis and received Amoxicilline by PMD 10 days ago, still cont to get worse, came to ER 5 days ago- was given oral steroids, no help, so came back to ER  Spoke with Mary Barker at bedside. She is a little slow to respond but seems to respond appropriately.  Normally eats 3 meals per day at her [facility] or a hard boiled egg and yogurt for breakfast Lunch - whatever the facility cooks ( largest meal of day for patient) Dinner - Soup and Fruit  Only ate bites this morning - complained food does not have salt on it.  Reports a normal weight of 125# - states she has lost 4 or 5 pounds but then said she wasn't sure.  Denies any chewing/swallowing issues or nausea/vomiting/diarrhea/constipation  Nutrition-Focused physical exam completed. Findings are no fat depletion, no muscle depletion, and no edema.   Overall, just feels weak per patient.  Labs and medications reviewed: Na 128, K 3.0 Ca-Vit D NS + KCL 20 @ 33mL/hr   Diet Order:  Diet Heart Room service appropriate? Yes; Fluid consistency: Thin  Skin:  Reviewed, no issues  Last BM:  12/04/2016  Height:   Ht Readings from Last 1 Encounters:  11/28/16 5' (1.524 m)    Weight:   Wt Readings from Last 1 Encounters:  12/04/16 130 lb (59 kg)    Ideal Body  Weight:  45.45 kg  BMI:  Body mass index is 25.39 kg/m.  Estimated Nutritional Needs:   Kcal:  1500-1800 calories  Protein:  60-71 gm  Fluid:  >/= 1.5L  EDUCATION NEEDS:   No education needs identified at this time  Mary Barker. Mary Murata, MS, RD LDN Inpatient Clinical Dietitian Pager [phone]

## 2016-12-05 NOTE — Evaluation (Signed)
Physical Therapy Evaluation Patient Details Name: Mary Barker MRN: [id number] DOB: 1929/11/20 Today's Date: 12/05/2016   History of Present Illness  Pt is a 83 yo female admitted to [facility] with a progressive cough, weakness and SOB, found to have pneumonia. PMH includes; HTN, thyroid disease, bronchitis, OA and carpal tunnel syndrome    Clinical Impression  Pt A&Ox4 during evaluation and demonstrated good participation and safety awareness throughout PT session. Pt lives alone at an independent living facility and is normally independent in all mobility tasks w/o the need for an AD, has her main meal provided by the independent living facility, and normally ambulates to the dining hall. She presented today w/ some generalized weakness throughout grossly 4/5 for strength but was able to safely perform bed mobility and transfers independently w/ PT supervision and ambulated w/ min guarding. Pt displayed decreased gait speed during ambulation and took small steps, states she normally is a fast walker but just feels weak. Attempted ambulating w/ and w/o a RW and patient displayed good balance w/o an AD but may benefit from use of RW for improved energy conservation. She demonstrated sings of fatigue following 150' of ambulation, O2 sat remained above 90% on RA during ambulation. Pt will benefit from skilled PT to improve her activity tolerance and overall strength and allow her to return to her PLOF, recommend HHPT following acute hospital stay.      Follow Up Recommendations Home health PT    Equipment Recommendations       Recommendations for Other Services       Precautions / Restrictions Precautions Precautions: Fall Restrictions Weight Bearing Restrictions: No      Mobility  Bed Mobility Overal bed mobility: Modified Independent             General bed mobility comments: able to move to sitting at EOB w/ increased time  Transfers Overall transfer level: Needs  assistance Equipment used: Rolling walker (2 wheeled) Transfers: Sit to/from Stand Sit to Stand: Supervision         General transfer comment: pt able to transfer w/ push off from bilat UEs, used RW and required cuing for hand placement, also able to safely transfer to standing w/o the need of an assistive device  Ambulation/Gait Ambulation/Gait assistance: Min guard Ambulation Distance (Feet): 150 Feet Assistive device: Rolling walker (2 wheeled);None Gait Pattern/deviations: Step-through pattern;Decreased stride length;Decreased step length - left;Decreased stance time - right   Gait velocity interpretation: Below normal speed for age/gender General Gait Details: ambulated w/ RW and without and AD, demonstrated good stability w/ and w/o the RW, displayed decreased cadence and gait speed during ambulation, stated she normally walks alot faster  Stairs            Wheelchair Mobility    Modified Rankin (Stroke Patients Only)       Balance Overall balance assessment: Needs assistance;History of Falls Sitting-balance support: Feet supported;No upper extremity supported Sitting balance-Leahy Scale: Normal Sitting balance - Comments: able to maintain upright seated posture w/o back support   Standing balance support: No upper extremity supported Standing balance-Leahy Scale: Good Standing balance comment: pt has history of falling at end of february that resulted in a C vertebral fracture, displayed good static balance and dynamic balance, w/ no LOB or staggering                              Pertinent Vitals/Pain Pain Assessment: No/denies pain  Home Living Family/patient expects to be discharged to:: Private residence Living Arrangements: Alone   Type of Home: Independent living facility Home Access: Level entry     Home Layout: One level Home Equipment: Walker - 2 wheels      Prior Function Level of Independence: Independent          Comments: pt is independent in mobility and dressing and grooming, has meals prepared by independent living facility, normally ambulates to the dining hall independently      Hand Dominance        Extremity/Trunk Assessment   Upper Extremity Assessment Upper Extremity Assessment: Generalized weakness (grossly 4/5)    Lower Extremity Assessment Lower Extremity Assessment: Generalized weakness (grossly 4/5)    Cervical / Trunk Assessment Cervical / Trunk Assessment: Normal  Communication   Communication: No difficulties  Cognition Arousal/Alertness: Awake/alert Behavior During Therapy: WFL for tasks assessed/performed Overall Cognitive Status: Within Functional Limits for tasks assessed                                        General Comments      Exercises     Assessment/Plan    PT Assessment Patient needs continued PT services  PT Problem List Decreased strength;Decreased activity tolerance;Decreased mobility;Decreased knowledge of use of DME;Cardiopulmonary status limiting activity       PT Treatment Interventions DME instruction;Gait training;Functional mobility training;Balance training;Therapeutic exercise;Therapeutic activities;Patient/family education    PT Goals (Current goals can be found in the Care Plan section)  Acute Rehab PT Goals Patient Stated Goal: To get stronger and return home PT Goal Formulation: With patient Time For Goal Achievement: 12/19/16 Potential to Achieve Goals: Good    Frequency Min 2X/week   Barriers to discharge        Co-evaluation               End of Session Equipment Utilized During Treatment: Gait belt Activity Tolerance: Patient limited by fatigue Patient left: in chair;with call bell/phone within reach;with chair alarm set Nurse Communication: Mobility status PT Visit Diagnosis: Muscle weakness (generalized) (M62.81);History of falling (Z91.81)    Time: 0940-1009 PT Time Calculation  (min) (ACUTE ONLY): 29 min   Charges:         PT G Codes:        Mary Barker   Mary Barker 12/05/2016, 10:21 AM

## 2016-12-05 NOTE — Progress Notes (Signed)
MEDICATION RELATED CONSULT NOTE - INITIAL   Pharmacy Consult for electrolyte management Indication: hypokalemia/hyponatremia  Allergies  Allergen Reactions  . Codeine Nausea And Vomiting    Patient Measurements: Weight: 130 lb (59 kg) Adjusted Body Weight:   Vital Signs: Temp: 97.8 F (36.6 C) (04/02 0020) Temp Source: Oral (04/02 0020) BP: 168/66 (04/02 0020) Pulse Rate: 80 (04/02 0020) Intake/Output from previous day: 04/01 0701 - 04/02 0700 In: 390 [P.O.:240; IV Piggyback:150] Out: -  Intake/Output from this shift: No intake/output data recorded.  Labs:  Recent Labs  12/04/16 1401 12/05/16 0319  WBC 13.0* 9.9  HGB 14.9 13.4  HCT 43.2 39.0  PLT 351 311  CREATININE 0.90 0.56   Estimated Creatinine Clearance: 41.3 mL/min (by C-G formula based on SCr of 0.56 mg/dL).   Microbiology: Recent Results (from the past 720 hour(s))  MRSA PCR Screening     Status: None   Collection Time: 12/04/16  9:23 PM  Result Value Ref Range Status   MRSA by PCR NEGATIVE NEGATIVE Final    Comment:        The GeneXpert MRSA Assay (FDA approved for NASAL specimens only), is one component of a comprehensive MRSA colonization surveillance program. It is not intended to diagnose MRSA infection nor to guide or monitor treatment for MRSA infections.     Medical History: Past Medical History:  Diagnosis Date  . Carpal tunnel syndrome   . Hypertension   . Osteoarthritis   . Thyroid disease     Medications:  Scheduled:  . aspirin  325 mg Oral Daily  . atenolol  25 mg Oral Daily  . azithromycin  250 mg Intravenous Q24H  . calcium-vitamin D  1 tablet Oral Daily  . cefTRIAXone (ROCEPHIN) IVPB 1 gram/50 mL D5W  1 g Intravenous Q24H  . fesoterodine  4 mg Oral Daily  . heparin  5,000 Units Subcutaneous Q8H  . levothyroxine  125 mcg Oral QAC breakfast  . losartan  50 mg Oral Daily  . potassium chloride  40 mEq Oral Once    Assessment: Patient admitted for PNA found to  have Na 128, K+ 3.0.  Goal of Therapy:  K+ 3.5 - 5.0 Na 135 - 145 Mg 1.8 - 2.2  Plan:  Will give KCI 40 mEq PO x 1 and run NS-KCI 20 mEq @ 75 ml/hour. Mg check this am. Will recheck electrolytes w/ am labs.   Thank you for this consult.  Tobie Lords, PharmD, BCPS Clinical Pharmacist 12/05/2016

## 2016-12-05 NOTE — Progress Notes (Signed)
[facility] NAME: Mary Barker    MR#:  [id number]  DATE OF BIRTH:  Mar 30, 1930  SUBJECTIVE:   Patient here with pneumonia she still has a cough and is feeling weak  REVIEW OF SYSTEMS:    Review of Systems  Constitutional: Negative for chills, fever and malaise/fatigue.  HENT: Negative.  Negative for ear discharge, ear pain, hearing loss, nosebleeds and sore throat.   Eyes: Negative.  Negative for blurred vision and pain.  Respiratory: Positive for cough. Negative for hemoptysis, shortness of breath and wheezing.   Cardiovascular: Negative.  Negative for chest pain, palpitations and leg swelling.  Gastrointestinal: Negative.  Negative for abdominal pain, blood in stool, diarrhea, nausea and vomiting.  Genitourinary: Negative.  Negative for dysuria.  Musculoskeletal: Negative.  Negative for back pain.  Skin: Negative.   Neurological: Positive for weakness. Negative for dizziness, tremors, speech change, focal weakness, seizures and headaches.  Endo/Heme/Allergies: Negative.  Does not bruise/bleed easily.  Psychiatric/Behavioral: Negative.  Negative for depression, hallucinations and suicidal ideas.    Tolerating Diet: yes      DRUG ALLERGIES:   Allergies  Allergen Reactions  . Codeine Nausea And Vomiting    VITALS:  Blood pressure (!) 168/66, pulse 80, temperature 97.8 F (36.6 C), temperature source Oral, resp. rate 18, weight 59 kg (130 lb), SpO2 98 %.  PHYSICAL EXAMINATION:   Physical Exam    LABORATORY PANEL:   CBC  Recent Labs Lab 12/05/16 0319  WBC 9.9  HGB 13.4  HCT 39.0  PLT 311   ------------------------------------------------------------------------------------------------------------------  Chemistries   Recent Labs Lab 11/28/16 1622  12/05/16 0319  NA 127*  < > 128*  K 3.8  < > 3.0*  CL 92*  < > 94*  CO2 26  < > 24  GLUCOSE 125*  < > 98  BUN 11  < > 15  CREATININE 0.64  < > 0.56   CALCIUM 9.2  < > 8.8*  AST 26  --   --   ALT 21  --   --   ALKPHOS 66  --   --   BILITOT 0.6  --   --   < > = values in this interval not displayed. ------------------------------------------------------------------------------------------------------------------  Cardiac Enzymes  Recent Labs Lab 11/28/16 1622 12/04/16 1401  TROPONINI <0.03 <0.03   ------------------------------------------------------------------------------------------------------------------  RADIOLOGY:  Dg Chest 2 View  Result Date: 12/04/2016 CLINICAL DATA:  Initial evaluation for acute shortness of breath for 1 month, progressively worsening. EXAM: CHEST  2 VIEW COMPARISON:  Prior radiograph 11/28/2016. FINDINGS: Cardiac and mediastinal silhouettes are stable in size and contour, and remain within normal limits. Atherosclerotic plaque present within aortic arch. Lungs normally inflated. Calcified granuloma noted within the right middle lobe, stable. Question of a hazy developing density at the medial right lung base, which may reflect a small focus of infectious pneumonitis in the correct clinical setting. Atelectatic changes could also be considered. This is not definite seen on previous. No other focal infiltrates. No pulmonary edema or pleural effusion. No pneumothorax. No acute osseus abnormality appear multilevel degenerative spurring noted within the visualized spine. IMPRESSION: 1. Subtle hazy density within the medial right lung base, not definite seen on prior, and may reflect a small focus of infectious pneumonitis or possibly atelectatic changes. 2. Otherwise stable appearance of the chest. No other active cardiopulmonary disease. 3. Aortic atherosclerosis. Electronically Signed   By: Jeannine Boga M.D.   On: 12/04/2016 15:51  ASSESSMENT AND PLAN:   83 year old female with hypothyroidism who presented with weakness and shortness of breath and found to have community-acquired pneumonia.   1.  Community acquired pneumonia: Continue Rocephin and azithromycin Follow up on blood and sputum cultures  2. Hyponatremia: Start IV fluids and repeat in a.m. Patient has some baseline hyponatremia  3. Hypokalemia: Replete and recheck in a.m.  4. Essential hypertension: Continue Cozaar and atenolol Blood pressure acceptable  5. Hypothyroid: Continue Synthroid  PT consult for disposition  Management plans discussed with the patient and she is in agreement.  CODE STATUS: FULL  TOTAL TIME TAKING CARE OF THIS PATIENT: 30 minutes.     POSSIBLE D/C tomorrow, DEPENDING ON CLINICAL CONDITION.   Federica Allport M.D on 12/05/2016 at 7:53 AM  Between 7am to 6pm - Pager - [phone] After 6pm go to [email or web address] - password [email or web address] [facility]  Office  [phone]  CC: Primary care physician; [place] Pink, MD  Note: This dictation was prepared with Dragon dictation along with smaller phrase technology. Any transcriptional errors that result from this process are unintentional.

## 2016-12-05 NOTE — Care Management Note (Signed)
Case Management Note  Patient Details  Name: VERALYN LOPP MRN: [id number] Date of Birth: 1930-05-17  Subjective/Objective:  Admitted with PNA. Met with patient at bedside to discuss discharge planning. Patient lives at [place] in a two bedroom home alone. Prior to admission she was independent and active.  She has not required any DME at home but states she has a 2 wheeled  walker she can use if needed. Discussed HHPT. She is agreeable. Offered choice of agencies.  Referral to [facility] with [facility] for [facility].  PCP is [place] Pink.                  Action/Plan: [facility] for HHPT. It is anticipated patient will discharge tomorrow.   Expected Discharge Date:                  Expected Discharge Plan:  [place]  In-House Referral:     Discharge planning Services  CM Consult  Post Acute Care Choice:  Home Health Choice offered to:  Patient  DME Arranged:    DME Agency:     HH Arranged:  PT [place]:  [facility]  Status of Service:  In process, will continue to follow  If discussed at Long Length of Stay Meetings, dates discussed:    Additional Comments:  Jolly Mango, RN 12/05/2016, 11:40 AM

## 2016-12-05 NOTE — Progress Notes (Signed)
Family Meeting Note  [facility] Directive:yes  Today a meeting took place with the Patient.     The following clinical team members were present during this meeting:MD  The following were discussed:Patient's diagnosis: CAP , Patient's progosis: > 12 months and Goals for treatment: Full Code  Additional follow-up to be provided: none Patient believes she is fairly healthy and would like FULL code if she should have cardiac arrest/pulmonary arrest She has advanced directives and her son is POA  Time spent during discussion:16 minutes  Icelyn Navarrete, MD

## 2016-12-06 DIAGNOSIS — J189 Pneumonia, unspecified organism: Secondary | ICD-10-CM | POA: Diagnosis not present

## 2016-12-06 DIAGNOSIS — I10 Essential (primary) hypertension: Secondary | ICD-10-CM | POA: Diagnosis not present

## 2016-12-06 DIAGNOSIS — E871 Hypo-osmolality and hyponatremia: Secondary | ICD-10-CM | POA: Diagnosis not present

## 2016-12-06 DIAGNOSIS — E876 Hypokalemia: Secondary | ICD-10-CM | POA: Diagnosis not present

## 2016-12-06 LAB — BASIC METABOLIC PANEL
Anion gap: 6 (ref 5–15)
BUN: 13 mg/dL (ref 6–20)
CHLORIDE: 103 mmol/L (ref 101–111)
CO2: 24 mmol/L (ref 22–32)
CREATININE: 0.79 mg/dL (ref 0.44–1.00)
Calcium: 8.7 mg/dL — ABNORMAL LOW (ref 8.9–10.3)
GFR calc Af Amer: >60 mL/min (ref >60)
GFR calc non Af Amer: >60 mL/min (ref >60)
GLUCOSE: 87 mg/dL (ref 65–99)
POTASSIUM: 3.8 mmol/L (ref 3.5–5.1)
Sodium: 133 mmol/L — ABNORMAL LOW (ref 135–145)

## 2016-12-06 MED ORDER — LEVOFLOXACIN 750 MG PO TABS: 750.0000 mg | ORAL_TABLET | Freq: Every day | ORAL | 0 refills | Status: AC

## 2016-12-06 MED ORDER — DEXTROMETHORPHAN HBR 15 MG/5ML PO SYRP: 10.0000 mL | ORAL_SOLUTION | Freq: Four times a day (QID) | ORAL | 0 refills | Status: DC | PRN

## 2016-12-06 MED ORDER — BENZONATATE 100 MG PO CAPS: 100.0000 mg | ORAL_CAPSULE | Freq: Three times a day (TID) | ORAL | 0 refills | Status: DC | PRN

## 2016-12-06 MED ORDER — LEVOFLOXACIN 750 MG PO TABS: 750.0000 mg | ORAL_TABLET | Freq: Every day | ORAL | 0 refills | Status: DC

## 2016-12-06 MED ORDER — ENSURE ENLIVE PO LIQD: 237.0000 mL | Freq: Two times a day (BID) | ORAL | 12 refills | Status: DC

## 2016-12-06 MED ORDER — GUAIFENESIN 100 MG/5ML PO SOLN
5.0000 mL | Freq: Four times a day (QID) | ORAL | Status: DC | PRN
  Administered 2016-12-06 (×2): 100 mg via ORAL
  Filled 2016-12-06 (×3): qty 5

## 2016-12-06 NOTE — Progress Notes (Signed)
Mary Barker to be D/C'd Home per MD order.  Discussed with the patient and all questions fully answered.  VSS, Skin clean, dry and intact without evidence of skin break down, no evidence of skin tears noted. IV catheter discontinued intact. Site without signs and symptoms of complications. Dressing and pressure applied.  An After Visit Summary was printed and given to the patient. Patient received prescription.  D/c education completed with patient/family including follow up instructions, medication list, d/c activities limitations if indicated, with other d/c instructions as indicated by MD - patient able to verbalize understanding, all questions fully answered.   Patient instructed to return to ED, call 911, or call MD for any changes in condition.   Patient escorted via [facility] by volunteer services, and D/C home via private auto.  Mary Barker 12/06/2016 1:50 PM

## 2016-12-06 NOTE — Care Management Note (Signed)
Case Management Note  Patient Details  Name: ARALY KAAS MRN: [id number] Date of Birth: 1930-03-16  Subjective/Objective:    Discharging today                Action/Plan: [facility] notified of discharge.    Expected Discharge Date:  12/06/16               Expected Discharge Plan:  [place]  In-House Referral:     Discharge planning Services  CM Consult  Post Acute Care Choice:  Home Health Choice offered to:  Patient  DME Arranged:    DME Agency:     HH Arranged:  PT [place]:  [place]  Status of Service:  Completed, signed off  If discussed at [place] of Stay Meetings, dates discussed:    Additional Comments:  Jolly Mango, RN 12/06/2016, 11:47 AM

## 2016-12-06 NOTE — Discharge Summary (Signed)
[facility] [place] NAME: Mary Barker    MR#:  [id number]  DATE OF BIRTH:  Apr 18, 1930  DATE OF ADMISSION:  12/04/2016 ADMITTING PHYSICIAN: Vaughan Basta, MD  DATE OF DISCHARGE: 12/06/2016  PRIMARY CARE PHYSICIAN: Maryland Pink, MD    ADMISSION DIAGNOSIS:  Shortness of breath [R06.02] Community acquired pneumonia of right lower lobe of lung ([place]) [J18.1]  DISCHARGE DIAGNOSIS:  Principal Problem:   Pneumonia   SECONDARY DIAGNOSIS:   Past Medical History:  Diagnosis Date  . Carpal tunnel syndrome   . Hypertension   . Osteoarthritis   . Thyroid disease     HOSPITAL COURSE:   83 year old female with hypothyroidism who presented with weakness and shortness of breath and found to have community-acquired pneumonia.   1. Community acquired pneumonia:Her symptoms improved on Rocephin and azithromycin. She will be discharged on Levaquin.  2. Hyponatremia: Sodium has improved with IV fluids and is at baseline. Hyponatremia due to poor by mouth intake 3. Hypokalemia: This was repleted and now resolved 4. Essential hypertension: She will Continue Cozaar and atenolol Blood pressure acceptable  5. Hypothyroid: Continue Synthroid   DISCHARGE CONDITIONS AND DIET:   Stable for discharge on regular diet  CONSULTS OBTAINED:    DRUG ALLERGIES:   Allergies  Allergen Reactions  . Codeine Nausea And Vomiting    DISCHARGE MEDICATIONS:   Current Discharge Medication List    START taking these medications   Details  benzonatate (TESSALON PERLES) 100 MG capsule Take 1 capsule (100 mg total) by mouth 3 (three) times daily as needed for cough. Qty: 20 capsule, Refills: 0    dextromethorphan 15 MG/5ML syrup Take 10 mLs (30 mg total) by mouth 4 (four) times daily as needed for cough. Qty: 118 mL, Refills: 0    feeding supplement, ENSURE ENLIVE, (ENSURE ENLIVE) LIQD Take 237 mLs by mouth 2 (two) times daily between  meals. Qty: 237 mL, Refills: 12    levofloxacin (LEVAQUIN) 750 MG tablet Take 1 tablet (750 mg total) by mouth daily. Qty: 5 tablet, Refills: 0      CONTINUE these medications which have NOT CHANGED   Details  aspirin 81 MG tablet Take 325 mg by mouth daily.    atenolol (TENORMIN) 25 MG tablet Take 25 mg by mouth daily.    Calcium Carbonate-Vitamin D (CALCIUM 600+D) 600-200 MG-UNIT TABS Take 2 tablets by mouth daily.    levothyroxine (SYNTHROID, LEVOTHROID) 125 MCG tablet Take 1 tablet by mouth daily.    losartan (COZAAR) 50 MG tablet Take 50 mg by mouth daily.    predniSONE (DELTASONE) 5 MG tablet Take 1 tablet by mouth daily.    tolterodine (DETROL LA) 4 MG 24 hr capsule Take 4 mg by mouth daily.    triamterene-hydrochlorothiazide (MAXZIDE-25) 37.5-25 MG tablet Take 1 tablet by mouth daily.          Today   CHIEF COMPLAINT:   She feels 100% better.   VITAL SIGNS:  Blood pressure (!) 146/57, pulse 64, temperature 98.3 F (36.8 C), temperature source Oral, resp. rate 18, height 5' (1.524 m), weight 60 kg (132 lb 4.4 oz), SpO2 94 %.   REVIEW OF SYSTEMS:  Review of Systems  Constitutional: Negative.  Negative for chills, fever and malaise/fatigue.  HENT: Negative.  Negative for ear discharge, ear pain, hearing loss, nosebleeds and sore throat.   Eyes: Negative.  Negative for blurred vision and pain.  Respiratory: Negative.  Negative for cough, hemoptysis, shortness  of breath and wheezing.   Cardiovascular: Negative.  Negative for chest pain, palpitations and leg swelling.  Gastrointestinal: Negative.  Negative for abdominal pain, blood in stool, diarrhea, nausea and vomiting.  Genitourinary: Negative.  Negative for dysuria.  Musculoskeletal: Negative.  Negative for back pain.  Skin: Negative.   Neurological: Negative for dizziness, tremors, speech change, focal weakness, seizures and headaches.  Endo/Heme/Allergies: Negative.  Does not bruise/bleed easily.   Psychiatric/Behavioral: Negative.  Negative for depression, hallucinations and suicidal ideas.     PHYSICAL EXAMINATION:  GENERAL:  83 y.o.-year-old patient lying in the bed with no acute distress.  NECK:  Supple, no jugular venous distention. No thyroid enlargement, no tenderness.  LUNGS: Normal breath sounds bilaterally, no wheezing, rales,rhonchi  No use of accessory muscles of respiration.  CARDIOVASCULAR: S1, S2 normal. No murmurs, rubs, or gallops.  ABDOMEN: Soft, non-tender, non-distended. Bowel sounds present. No organomegaly or mass.  EXTREMITIES: No pedal edema, cyanosis, or clubbing.  PSYCHIATRIC: The patient is alert and oriented x 3.  SKIN: No obvious rash, lesion, or ulcer.   DATA REVIEW:   CBC  Recent Labs Lab 12/05/16 0319  WBC 9.9  HGB 13.4  HCT 39.0  PLT 311    Chemistries   Recent Labs Lab 12/05/16 0319 12/06/16 0357  NA 128* 133*  K 3.0* 3.8  CL 94* 103  CO2 24 24  GLUCOSE 98 87  BUN 15 13  CREATININE 0.56 0.79  CALCIUM 8.8* 8.7*  MG 1.7  --     Cardiac Enzymes  Recent Labs Lab 12/04/16 1401  TROPONINI <0.03    Microbiology Results  @[email or web address] @  RADIOLOGY:  Dg Chest 2 View  Result Date: 12/04/2016 CLINICAL DATA:  Initial evaluation for acute shortness of breath for 1 month, progressively worsening. EXAM: CHEST  2 VIEW COMPARISON:  Prior radiograph 11/28/2016. FINDINGS: Cardiac and mediastinal silhouettes are stable in size and contour, and remain within normal limits. Atherosclerotic plaque present within aortic arch. Lungs normally inflated. Calcified granuloma noted within the right middle lobe, stable. Question of a hazy developing density at the medial right lung base, which may reflect a small focus of infectious pneumonitis in the correct clinical setting. Atelectatic changes could also be considered. This is not definite seen on previous. No other focal infiltrates. No pulmonary edema or pleural effusion. No pneumothorax. No  acute osseus abnormality appear multilevel degenerative spurring noted within the visualized spine. IMPRESSION: 1. Subtle hazy density within the medial right lung base, not definite seen on prior, and may reflect a small focus of infectious pneumonitis or possibly atelectatic changes. 2. Otherwise stable appearance of the chest. No other active cardiopulmonary disease. 3. Aortic atherosclerosis. Electronically Signed   By: Jeannine Boga M.D.   On: 12/04/2016 15:51      Current Discharge Medication List    START taking these medications   Details  benzonatate (TESSALON PERLES) 100 MG capsule Take 1 capsule (100 mg total) by mouth 3 (three) times daily as needed for cough. Qty: 20 capsule, Refills: 0    dextromethorphan 15 MG/5ML syrup Take 10 mLs (30 mg total) by mouth 4 (four) times daily as needed for cough. Qty: 118 mL, Refills: 0    feeding supplement, ENSURE ENLIVE, (ENSURE ENLIVE) LIQD Take 237 mLs by mouth 2 (two) times daily between meals. Qty: 237 mL, Refills: 12    levofloxacin (LEVAQUIN) 750 MG tablet Take 1 tablet (750 mg total) by mouth daily. Qty: 5 tablet, Refills: 0  CONTINUE these medications which have NOT CHANGED   Details  aspirin 81 MG tablet Take 325 mg by mouth daily.    atenolol (TENORMIN) 25 MG tablet Take 25 mg by mouth daily.    Calcium Carbonate-Vitamin D (CALCIUM 600+D) 600-200 MG-UNIT TABS Take 2 tablets by mouth daily.    levothyroxine (SYNTHROID, LEVOTHROID) 125 MCG tablet Take 1 tablet by mouth daily.    losartan (COZAAR) 50 MG tablet Take 50 mg by mouth daily.    predniSONE (DELTASONE) 5 MG tablet Take 1 tablet by mouth daily.    tolterodine (DETROL LA) 4 MG 24 hr capsule Take 4 mg by mouth daily.    triamterene-hydrochlorothiazide (MAXZIDE-25) 37.5-25 MG tablet Take 1 tablet by mouth daily.         Management plans discussed with the patient and she is in agreement. Stable for discharge home  Patient should follow up with  pcp  CODE STATUS:     Code Status Orders        Start     Ordered   12/04/16 2034  Full code  Continuous     12/04/16 2033    Code Status History    Date Active Date Inactive Code Status Order ID Comments User Context   This patient has a current code status but no historical code status.    Advance Directive Documentation     Most Recent Value  Type of Advance Directive  Living will  Pre-existing out of facility DNR order (yellow form or pink MOST form)  -  "MOST" Form in Place?  -      TOTAL TIME TAKING CARE OF THIS PATIENT: 37 minutes.    Note: This dictation was prepared with Dragon dictation along with smaller phrase technology. Any transcriptional errors that result from this process are unintentional.  Giovana Faciane M.D on 12/06/2016 at 11:33 AM  Between 7am to 6pm - Pager - [phone] After 6pm go to [email or web address] - password [email or web address] [facility]  Office  [phone]  CC: Primary care physician; Maryland Pink, MD

## 2016-12-06 NOTE — Plan of Care (Signed)
Problem: Respiratory: Goal: Respiratory status will improve Outcome: Progressing Patient not complaining of any SOB. O2 saturations >93% on room air.   Deri Fuelling, RN

## 2016-12-06 NOTE — Progress Notes (Signed)
[facility]  Patient Status: not taken under care, patient refused HHPT due to copay. Lives at [facility] and would like on site PT [name] to perform her PT. Notified Dr. Kary Kos and Orvan July, CM.  If patient discharges after hours, please call [phone].   Mary Barker 12/06/2016, 4:01 PM

## 2016-12-07 DIAGNOSIS — E871 Hypo-osmolality and hyponatremia: Secondary | ICD-10-CM | POA: Diagnosis not present

## 2016-12-07 DIAGNOSIS — E039 Hypothyroidism, unspecified: Secondary | ICD-10-CM | POA: Diagnosis not present

## 2016-12-07 DIAGNOSIS — K5900 Constipation, unspecified: Secondary | ICD-10-CM | POA: Diagnosis not present

## 2016-12-07 DIAGNOSIS — Z9181 History of falling: Secondary | ICD-10-CM | POA: Diagnosis not present

## 2016-12-07 DIAGNOSIS — M47814 Spondylosis without myelopathy or radiculopathy, thoracic region: Secondary | ICD-10-CM | POA: Diagnosis not present

## 2016-12-07 DIAGNOSIS — I160 Hypertensive urgency: Secondary | ICD-10-CM | POA: Diagnosis not present

## 2016-12-07 DIAGNOSIS — J189 Pneumonia, unspecified organism: Secondary | ICD-10-CM | POA: Diagnosis not present

## 2016-12-07 DIAGNOSIS — Z8701 Personal history of pneumonia (recurrent): Secondary | ICD-10-CM | POA: Diagnosis not present

## 2016-12-07 DIAGNOSIS — R202 Paresthesia of skin: Secondary | ICD-10-CM | POA: Diagnosis not present

## 2016-12-07 DIAGNOSIS — Z7982 Long term (current) use of aspirin: Secondary | ICD-10-CM | POA: Diagnosis not present

## 2016-12-07 DIAGNOSIS — E038 Other specified hypothyroidism: Secondary | ICD-10-CM | POA: Diagnosis not present

## 2016-12-07 DIAGNOSIS — M4316 Spondylolisthesis, lumbar region: Secondary | ICD-10-CM | POA: Diagnosis not present

## 2016-12-07 DIAGNOSIS — I10 Essential (primary) hypertension: Secondary | ICD-10-CM | POA: Diagnosis not present

## 2016-12-07 DIAGNOSIS — Z87891 Personal history of nicotine dependence: Secondary | ICD-10-CM | POA: Diagnosis not present

## 2016-12-09 LAB — CULTURE, BLOOD (ROUTINE X 2)
CULTURE: NO GROWTH
Culture: NO GROWTH

## 2016-12-12 ENCOUNTER — Other Ambulatory Visit: Payer: Self-pay

## 2016-12-12 DIAGNOSIS — H353211 Exudative age-related macular degeneration, right eye, with active choroidal neovascularization: Secondary | ICD-10-CM | POA: Diagnosis not present

## 2016-12-12 DIAGNOSIS — H353122 Nonexudative age-related macular degeneration, left eye, intermediate dry stage: Secondary | ICD-10-CM | POA: Diagnosis not present

## 2016-12-12 NOTE — Patient Outreach (Signed)
[facility]) Care Management  12/12/2016  Mary Barker June 19, 1930 [id number]     EMMI- GENERAL DISCHARGE RED ON EMMI ALERT Day # 1 Date: 12/10/16 Red Alert Reason: " Know who to call about changes in condition? No"  "transportation to follow up? No"    Outreach attempt # 1 to patient. Spoke with patient. Reviewed and addressed red alert. Patient states she responded that way as she did not know where automated call was coming from and why she was getting automated call. Discussed with patient the nature, purpose and rationale of call. She voiced understanding and states that had she known about automated call she would have responded a different way. She voices that she does know who,when and how to contact MD of any changes in her condition. She reports that she is doing and feeling much better. She has been told that her "PNA is gone and lungs are clear.' Reviewed with patient s/s of worsening condition and when to seek medial attention. She voiced understanding. She goes for PCP f/u appt this week and confirmed that she has transportation to ppt. Patient states she is a resident at [facility] independent [facility] and has great staff at facility that are able to assist her with any of her needs. She denies any RN CM needs or concerns at this time. Patient appreciative of f/u call.    Plan: RN CM will notify [facility] administrative assistant of case status.   Enzo Montgomery, RN,BSN,CCM [place] [facility] Telephonic Care Management Coordinator Direct Phone: [phone] Toll Free: [phone] Fax: [phone]

## 2016-12-13 DIAGNOSIS — R262 Difficulty in walking, not elsewhere classified: Secondary | ICD-10-CM | POA: Diagnosis not present

## 2016-12-13 DIAGNOSIS — M6281 Muscle weakness (generalized): Secondary | ICD-10-CM | POA: Diagnosis not present

## 2016-12-14 DIAGNOSIS — E871 Hypo-osmolality and hyponatremia: Secondary | ICD-10-CM | POA: Diagnosis not present

## 2016-12-14 DIAGNOSIS — I10 Essential (primary) hypertension: Secondary | ICD-10-CM | POA: Diagnosis not present

## 2016-12-14 DIAGNOSIS — R5381 Other malaise: Secondary | ICD-10-CM | POA: Diagnosis not present

## 2016-12-14 DIAGNOSIS — R5383 Other fatigue: Secondary | ICD-10-CM | POA: Diagnosis not present

## 2016-12-19 DIAGNOSIS — M6281 Muscle weakness (generalized): Secondary | ICD-10-CM | POA: Diagnosis not present

## 2016-12-19 DIAGNOSIS — R262 Difficulty in walking, not elsewhere classified: Secondary | ICD-10-CM | POA: Diagnosis not present

## 2016-12-21 DIAGNOSIS — M6281 Muscle weakness (generalized): Secondary | ICD-10-CM | POA: Diagnosis not present

## 2016-12-21 DIAGNOSIS — R262 Difficulty in walking, not elsewhere classified: Secondary | ICD-10-CM | POA: Diagnosis not present

## 2016-12-23 DIAGNOSIS — R262 Difficulty in walking, not elsewhere classified: Secondary | ICD-10-CM | POA: Diagnosis not present

## 2016-12-23 DIAGNOSIS — M6281 Muscle weakness (generalized): Secondary | ICD-10-CM | POA: Diagnosis not present

## 2016-12-29 DIAGNOSIS — M6281 Muscle weakness (generalized): Secondary | ICD-10-CM | POA: Diagnosis not present

## 2016-12-29 DIAGNOSIS — R262 Difficulty in walking, not elsewhere classified: Secondary | ICD-10-CM | POA: Diagnosis not present

## 2017-01-05 DIAGNOSIS — I10 Essential (primary) hypertension: Secondary | ICD-10-CM | POA: Diagnosis not present

## 2017-01-05 DIAGNOSIS — R197 Diarrhea, unspecified: Secondary | ICD-10-CM | POA: Diagnosis not present

## 2017-01-06 DIAGNOSIS — R197 Diarrhea, unspecified: Secondary | ICD-10-CM | POA: Diagnosis not present

## 2017-01-11 DIAGNOSIS — I10 Essential (primary) hypertension: Secondary | ICD-10-CM | POA: Diagnosis not present

## 2017-01-14 DIAGNOSIS — H353211 Exudative age-related macular degeneration, right eye, with active choroidal neovascularization: Secondary | ICD-10-CM | POA: Diagnosis not present

## 2017-01-17 ENCOUNTER — Other Ambulatory Visit: Payer: Self-pay | Admitting: Family Medicine

## 2017-01-17 DIAGNOSIS — Z1231 Encounter for screening mammogram for malignant neoplasm of breast: Secondary | ICD-10-CM

## 2017-01-23 DIAGNOSIS — H353211 Exudative age-related macular degeneration, right eye, with active choroidal neovascularization: Secondary | ICD-10-CM | POA: Diagnosis not present

## 2017-02-09 DIAGNOSIS — I10 Essential (primary) hypertension: Secondary | ICD-10-CM | POA: Diagnosis not present

## 2017-02-14 DIAGNOSIS — R110 Nausea: Secondary | ICD-10-CM | POA: Diagnosis not present

## 2017-02-14 DIAGNOSIS — R197 Diarrhea, unspecified: Secondary | ICD-10-CM | POA: Diagnosis not present

## 2017-02-14 DIAGNOSIS — R1032 Left lower quadrant pain: Secondary | ICD-10-CM | POA: Diagnosis not present

## 2017-02-23 ENCOUNTER — Ambulatory Visit
Admission: RE | Admit: 2017-02-23 | Discharge: 2017-02-23 | Disposition: A | Discharge status: Home / Self Care | Payer: PPO | Source: Ambulatory Visit | Attending: Family Medicine | Admitting: Family Medicine

## 2017-02-23 DIAGNOSIS — Z1231 Encounter for screening mammogram for malignant neoplasm of breast: Secondary | ICD-10-CM

## 2017-03-13 DIAGNOSIS — H353211 Exudative age-related macular degeneration, right eye, with active choroidal neovascularization: Secondary | ICD-10-CM | POA: Diagnosis not present

## 2017-03-28 DIAGNOSIS — L570 Actinic keratosis: Secondary | ICD-10-CM | POA: Diagnosis not present

## 2017-03-28 DIAGNOSIS — Z85828 Personal history of other malignant neoplasm of skin: Secondary | ICD-10-CM | POA: Diagnosis not present

## 2017-03-28 DIAGNOSIS — L578 Other skin changes due to chronic exposure to nonionizing radiation: Secondary | ICD-10-CM | POA: Diagnosis not present

## 2017-03-28 DIAGNOSIS — D1800 Hemangioma unspecified site: Secondary | ICD-10-CM | POA: Diagnosis not present

## 2017-03-28 DIAGNOSIS — L821 Other seborrheic keratosis: Secondary | ICD-10-CM | POA: Diagnosis not present

## 2017-03-30 DIAGNOSIS — I10 Essential (primary) hypertension: Secondary | ICD-10-CM | POA: Diagnosis not present

## 2017-03-30 DIAGNOSIS — Z0000 Encounter for general adult medical examination without abnormal findings: Secondary | ICD-10-CM | POA: Diagnosis not present

## 2017-03-30 DIAGNOSIS — R350 Frequency of micturition: Secondary | ICD-10-CM | POA: Diagnosis not present

## 2017-04-07 DIAGNOSIS — I10 Essential (primary) hypertension: Secondary | ICD-10-CM | POA: Diagnosis not present

## 2017-04-07 DIAGNOSIS — R0602 Shortness of breath: Secondary | ICD-10-CM | POA: Diagnosis not present

## 2017-04-19 DIAGNOSIS — R0602 Shortness of breath: Secondary | ICD-10-CM | POA: Diagnosis not present

## 2017-04-26 DIAGNOSIS — H8111 Benign paroxysmal vertigo, right ear: Secondary | ICD-10-CM | POA: Diagnosis not present

## 2017-04-28 ENCOUNTER — Ambulatory Visit: Payer: PPO | Admitting: Cardiovascular Disease

## 2017-05-01 DIAGNOSIS — H353211 Exudative age-related macular degeneration, right eye, with active choroidal neovascularization: Secondary | ICD-10-CM | POA: Diagnosis not present

## 2017-05-03 DIAGNOSIS — H8111 Benign paroxysmal vertigo, right ear: Secondary | ICD-10-CM | POA: Diagnosis not present

## 2017-05-04 DIAGNOSIS — I10 Essential (primary) hypertension: Secondary | ICD-10-CM | POA: Diagnosis not present

## 2017-05-04 DIAGNOSIS — R0602 Shortness of breath: Secondary | ICD-10-CM | POA: Diagnosis not present

## 2017-05-09 DIAGNOSIS — R21 Rash and other nonspecific skin eruption: Secondary | ICD-10-CM | POA: Diagnosis not present

## 2017-05-09 DIAGNOSIS — H8111 Benign paroxysmal vertigo, right ear: Secondary | ICD-10-CM | POA: Diagnosis not present

## 2017-05-09 DIAGNOSIS — L299 Pruritus, unspecified: Secondary | ICD-10-CM | POA: Diagnosis not present

## 2017-05-12 DIAGNOSIS — H8111 Benign paroxysmal vertigo, right ear: Secondary | ICD-10-CM | POA: Diagnosis not present

## 2017-05-12 DIAGNOSIS — I10 Essential (primary) hypertension: Secondary | ICD-10-CM | POA: Diagnosis not present

## 2017-07-04 DIAGNOSIS — H353211 Exudative age-related macular degeneration, right eye, with active choroidal neovascularization: Secondary | ICD-10-CM | POA: Diagnosis not present

## 2017-08-10 DIAGNOSIS — R0602 Shortness of breath: Secondary | ICD-10-CM | POA: Diagnosis not present

## 2017-08-10 DIAGNOSIS — I10 Essential (primary) hypertension: Secondary | ICD-10-CM | POA: Diagnosis not present

## 2017-08-22 DIAGNOSIS — H353211 Exudative age-related macular degeneration, right eye, with active choroidal neovascularization: Secondary | ICD-10-CM | POA: Diagnosis not present

## 2017-08-22 DIAGNOSIS — H353122 Nonexudative age-related macular degeneration, left eye, intermediate dry stage: Secondary | ICD-10-CM | POA: Diagnosis not present

## 2017-10-02 DIAGNOSIS — R0602 Shortness of breath: Secondary | ICD-10-CM | POA: Diagnosis not present

## 2017-10-02 DIAGNOSIS — I10 Essential (primary) hypertension: Secondary | ICD-10-CM | POA: Diagnosis not present

## 2017-10-02 DIAGNOSIS — E039 Hypothyroidism, unspecified: Secondary | ICD-10-CM | POA: Diagnosis not present

## 2017-10-02 DIAGNOSIS — R195 Other fecal abnormalities: Secondary | ICD-10-CM | POA: Diagnosis not present

## 2017-10-03 DIAGNOSIS — R195 Other fecal abnormalities: Secondary | ICD-10-CM | POA: Diagnosis not present

## 2017-10-03 DIAGNOSIS — R531 Weakness: Secondary | ICD-10-CM | POA: Diagnosis not present

## 2017-10-03 DIAGNOSIS — R1084 Generalized abdominal pain: Secondary | ICD-10-CM | POA: Diagnosis not present

## 2017-10-10 DIAGNOSIS — H353211 Exudative age-related macular degeneration, right eye, with active choroidal neovascularization: Secondary | ICD-10-CM | POA: Diagnosis not present

## 2017-10-17 DIAGNOSIS — G5603 Carpal tunnel syndrome, bilateral upper limbs: Secondary | ICD-10-CM | POA: Diagnosis not present

## 2017-10-30 DIAGNOSIS — R0982 Postnasal drip: Secondary | ICD-10-CM | POA: Diagnosis not present

## 2017-10-30 DIAGNOSIS — R0602 Shortness of breath: Secondary | ICD-10-CM | POA: Diagnosis not present

## 2017-11-09 DIAGNOSIS — R05 Cough: Secondary | ICD-10-CM | POA: Diagnosis not present

## 2017-11-09 DIAGNOSIS — J449 Chronic obstructive pulmonary disease, unspecified: Secondary | ICD-10-CM | POA: Diagnosis not present

## 2017-11-09 DIAGNOSIS — R0602 Shortness of breath: Secondary | ICD-10-CM | POA: Diagnosis not present

## 2017-12-05 DIAGNOSIS — H353211 Exudative age-related macular degeneration, right eye, with active choroidal neovascularization: Secondary | ICD-10-CM | POA: Diagnosis not present

## 2017-12-07 DIAGNOSIS — H5711 Ocular pain, right eye: Secondary | ICD-10-CM | POA: Diagnosis not present

## 2018-01-30 DIAGNOSIS — H353122 Nonexudative age-related macular degeneration, left eye, intermediate dry stage: Secondary | ICD-10-CM | POA: Diagnosis not present

## 2018-01-30 DIAGNOSIS — H353211 Exudative age-related macular degeneration, right eye, with active choroidal neovascularization: Secondary | ICD-10-CM | POA: Diagnosis not present

## 2018-02-09 DIAGNOSIS — I10 Essential (primary) hypertension: Secondary | ICD-10-CM | POA: Diagnosis not present

## 2018-02-09 DIAGNOSIS — R0602 Shortness of breath: Secondary | ICD-10-CM | POA: Diagnosis not present

## 2018-02-21 DIAGNOSIS — J449 Chronic obstructive pulmonary disease, unspecified: Secondary | ICD-10-CM | POA: Diagnosis not present

## 2018-03-27 DIAGNOSIS — H353211 Exudative age-related macular degeneration, right eye, with active choroidal neovascularization: Secondary | ICD-10-CM | POA: Diagnosis not present

## 2018-04-02 ENCOUNTER — Other Ambulatory Visit: Payer: Self-pay | Admitting: Family Medicine

## 2018-04-02 DIAGNOSIS — Z1231 Encounter for screening mammogram for malignant neoplasm of breast: Secondary | ICD-10-CM

## 2018-04-03 DIAGNOSIS — D2362 Other benign neoplasm of skin of left upper limb, including shoulder: Secondary | ICD-10-CM | POA: Diagnosis not present

## 2018-04-03 DIAGNOSIS — D2262 Melanocytic nevi of left upper limb, including shoulder: Secondary | ICD-10-CM | POA: Diagnosis not present

## 2018-04-03 DIAGNOSIS — L570 Actinic keratosis: Secondary | ICD-10-CM | POA: Diagnosis not present

## 2018-04-03 DIAGNOSIS — Z85828 Personal history of other malignant neoplasm of skin: Secondary | ICD-10-CM | POA: Diagnosis not present

## 2018-04-03 DIAGNOSIS — L821 Other seborrheic keratosis: Secondary | ICD-10-CM | POA: Diagnosis not present

## 2018-04-03 DIAGNOSIS — D1800 Hemangioma unspecified site: Secondary | ICD-10-CM | POA: Diagnosis not present

## 2018-04-03 DIAGNOSIS — L820 Inflamed seborrheic keratosis: Secondary | ICD-10-CM | POA: Diagnosis not present

## 2018-04-03 DIAGNOSIS — D485 Neoplasm of uncertain behavior of skin: Secondary | ICD-10-CM | POA: Diagnosis not present

## 2018-04-03 DIAGNOSIS — L812 Freckles: Secondary | ICD-10-CM | POA: Diagnosis not present

## 2018-04-03 DIAGNOSIS — L818 Other specified disorders of pigmentation: Secondary | ICD-10-CM | POA: Diagnosis not present

## 2018-04-03 DIAGNOSIS — L578 Other skin changes due to chronic exposure to nonionizing radiation: Secondary | ICD-10-CM | POA: Diagnosis not present

## 2018-04-27 DIAGNOSIS — I10 Essential (primary) hypertension: Secondary | ICD-10-CM | POA: Diagnosis not present

## 2018-04-27 DIAGNOSIS — E039 Hypothyroidism, unspecified: Secondary | ICD-10-CM | POA: Diagnosis not present

## 2018-05-02 ENCOUNTER — Ambulatory Visit
Admission: RE | Admit: 2018-05-02 | Discharge: 2018-05-02 | Disposition: A | Discharge status: Home / Self Care | Payer: PPO | Source: Ambulatory Visit | Attending: Family Medicine | Admitting: Family Medicine

## 2018-05-02 DIAGNOSIS — Z1231 Encounter for screening mammogram for malignant neoplasm of breast: Secondary | ICD-10-CM | POA: Diagnosis not present

## 2018-05-02 DIAGNOSIS — Z23 Encounter for immunization: Secondary | ICD-10-CM | POA: Diagnosis not present

## 2018-05-02 DIAGNOSIS — I10 Essential (primary) hypertension: Secondary | ICD-10-CM | POA: Diagnosis not present

## 2018-05-02 DIAGNOSIS — Z0000 Encounter for general adult medical examination without abnormal findings: Secondary | ICD-10-CM | POA: Diagnosis not present

## 2018-05-02 DIAGNOSIS — E039 Hypothyroidism, unspecified: Secondary | ICD-10-CM | POA: Diagnosis not present

## 2018-05-29 DIAGNOSIS — H353211 Exudative age-related macular degeneration, right eye, with active choroidal neovascularization: Secondary | ICD-10-CM | POA: Diagnosis not present

## 2018-05-29 DIAGNOSIS — L0390 Cellulitis, unspecified: Secondary | ICD-10-CM | POA: Diagnosis not present

## 2018-05-31 DIAGNOSIS — R42 Dizziness and giddiness: Secondary | ICD-10-CM | POA: Diagnosis not present

## 2018-05-31 DIAGNOSIS — I10 Essential (primary) hypertension: Secondary | ICD-10-CM | POA: Diagnosis not present

## 2018-06-13 DIAGNOSIS — R5381 Other malaise: Secondary | ICD-10-CM | POA: Diagnosis not present

## 2018-06-13 DIAGNOSIS — J019 Acute sinusitis, unspecified: Secondary | ICD-10-CM | POA: Diagnosis not present

## 2018-06-13 DIAGNOSIS — R5383 Other fatigue: Secondary | ICD-10-CM | POA: Diagnosis not present

## 2018-06-13 DIAGNOSIS — B9689 Other specified bacterial agents as the cause of diseases classified elsewhere: Secondary | ICD-10-CM | POA: Diagnosis not present

## 2018-06-13 DIAGNOSIS — R112 Nausea with vomiting, unspecified: Secondary | ICD-10-CM | POA: Diagnosis not present

## 2018-06-13 DIAGNOSIS — R197 Diarrhea, unspecified: Secondary | ICD-10-CM | POA: Diagnosis not present

## 2018-06-13 DIAGNOSIS — E871 Hypo-osmolality and hyponatremia: Secondary | ICD-10-CM | POA: Diagnosis not present

## 2018-06-19 DIAGNOSIS — R5383 Other fatigue: Secondary | ICD-10-CM | POA: Diagnosis not present

## 2018-06-19 DIAGNOSIS — R700 Elevated erythrocyte sedimentation rate: Secondary | ICD-10-CM | POA: Diagnosis not present

## 2018-06-19 DIAGNOSIS — R5381 Other malaise: Secondary | ICD-10-CM | POA: Diagnosis not present

## 2018-06-19 DIAGNOSIS — I10 Essential (primary) hypertension: Secondary | ICD-10-CM | POA: Diagnosis not present

## 2018-06-19 DIAGNOSIS — D72829 Elevated white blood cell count, unspecified: Secondary | ICD-10-CM | POA: Diagnosis not present

## 2018-06-19 DIAGNOSIS — R531 Weakness: Secondary | ICD-10-CM | POA: Diagnosis not present

## 2018-06-21 DIAGNOSIS — I10 Essential (primary) hypertension: Secondary | ICD-10-CM | POA: Diagnosis not present

## 2018-06-22 DIAGNOSIS — H6123 Impacted cerumen, bilateral: Secondary | ICD-10-CM | POA: Diagnosis not present

## 2018-06-22 DIAGNOSIS — H903 Sensorineural hearing loss, bilateral: Secondary | ICD-10-CM | POA: Diagnosis not present

## 2018-06-22 DIAGNOSIS — J328 Other chronic sinusitis: Secondary | ICD-10-CM | POA: Diagnosis not present

## 2018-06-22 DIAGNOSIS — J301 Allergic rhinitis due to pollen: Secondary | ICD-10-CM | POA: Diagnosis not present

## 2018-06-25 DIAGNOSIS — R700 Elevated erythrocyte sedimentation rate: Secondary | ICD-10-CM | POA: Diagnosis not present

## 2018-06-25 DIAGNOSIS — D72829 Elevated white blood cell count, unspecified: Secondary | ICD-10-CM | POA: Diagnosis not present

## 2018-06-25 DIAGNOSIS — D473 Essential (hemorrhagic) thrombocythemia: Secondary | ICD-10-CM | POA: Diagnosis not present

## 2018-06-26 DIAGNOSIS — L0390 Cellulitis, unspecified: Secondary | ICD-10-CM | POA: Diagnosis not present

## 2018-07-02 ENCOUNTER — Other Ambulatory Visit: Payer: Self-pay | Admitting: Rheumatology

## 2018-07-02 DIAGNOSIS — D473 Essential (hemorrhagic) thrombocythemia: Secondary | ICD-10-CM

## 2018-07-02 DIAGNOSIS — D75839 Thrombocytosis, unspecified: Secondary | ICD-10-CM

## 2018-07-02 DIAGNOSIS — D72829 Elevated white blood cell count, unspecified: Secondary | ICD-10-CM

## 2018-07-05 ENCOUNTER — Ambulatory Visit: Payer: PPO

## 2018-07-11 DIAGNOSIS — L989 Disorder of the skin and subcutaneous tissue, unspecified: Secondary | ICD-10-CM | POA: Diagnosis not present

## 2018-07-11 DIAGNOSIS — Z862 Personal history of diseases of the blood and blood-forming organs and certain disorders involving the immune mechanism: Secondary | ICD-10-CM | POA: Diagnosis not present

## 2018-07-13 DIAGNOSIS — R5383 Other fatigue: Secondary | ICD-10-CM | POA: Diagnosis not present

## 2018-07-23 DIAGNOSIS — R5383 Other fatigue: Secondary | ICD-10-CM | POA: Diagnosis not present

## 2018-07-23 DIAGNOSIS — D72829 Elevated white blood cell count, unspecified: Secondary | ICD-10-CM | POA: Diagnosis not present

## 2018-07-23 DIAGNOSIS — R5381 Other malaise: Secondary | ICD-10-CM | POA: Diagnosis not present

## 2018-07-23 DIAGNOSIS — R110 Nausea: Secondary | ICD-10-CM | POA: Diagnosis not present

## 2018-07-23 DIAGNOSIS — R630 Anorexia: Secondary | ICD-10-CM | POA: Diagnosis not present

## 2018-07-26 DIAGNOSIS — R6884 Jaw pain: Secondary | ICD-10-CM | POA: Diagnosis not present

## 2018-07-26 DIAGNOSIS — R51 Headache: Secondary | ICD-10-CM | POA: Diagnosis not present

## 2018-07-26 DIAGNOSIS — R700 Elevated erythrocyte sedimentation rate: Secondary | ICD-10-CM | POA: Diagnosis not present

## 2018-07-27 DIAGNOSIS — M316 Other giant cell arteritis: Secondary | ICD-10-CM | POA: Diagnosis not present

## 2018-07-30 ENCOUNTER — Encounter
Admission: RE | Admit: 2018-07-30 | Discharge: 2018-07-30 | Disposition: A | Discharge status: Home / Self Care | Payer: PPO | Source: Ambulatory Visit | Attending: General Surgery | Admitting: General Surgery

## 2018-07-30 ENCOUNTER — Other Ambulatory Visit: Payer: Self-pay

## 2018-07-30 ENCOUNTER — Ambulatory Visit: Payer: Self-pay | Admitting: Surgery

## 2018-07-30 HISTORY — DX: Hypo-osmolality and hyponatremia: E87.1

## 2018-07-30 HISTORY — DX: Personal history of other specified conditions: Z87.898

## 2018-07-30 HISTORY — DX: Hypothyroidism, unspecified: E03.9

## 2018-07-30 HISTORY — DX: Chronic obstructive pulmonary disease, unspecified: J44.9

## 2018-07-30 HISTORY — DX: Paresthesia of skin: R20.2

## 2018-07-30 MED ORDER — CEFAZOLIN SODIUM-DEXTROSE 2-4 GM/100ML-% IV SOLN
2.0000 g | INTRAVENOUS | Status: AC
  Administered 2018-07-31: 2 g via INTRAVENOUS

## 2018-07-30 NOTE — Pre-Procedure Instructions (Signed)
Value Ref Range   Vent Rate (bpm) 61   PR Interval (msec) 202   QRS Interval (msec) 84   QT Interval (msec) 422   QTc (msec) 424   Other Result Information  This result has an attachment that is not available.  Result Narrative  Normal sinus rhythm Normal ECG When compared with ECG of 08-Dec-2016 06:43, No significant change was found I reviewed and concur with this report. Electronically signed SL:PNPY MD, KEN [phone]) on 02/11/2018 4:55:40 PM  Status Results Details

## 2018-07-30 NOTE — H&P (View-Only) (Signed)
Subjective:   CC: Temporal arteritis (CMS-HCC) [M31.6]  HPI:  Mary Barker is a 84 y.o. female who was referred by Dorthula Matas* for evaluation of above.   Workup for her symptoms which include left sided face pain done in walk in and by Dr. Jefm Bryant concerning for temporal arteritis so referred to our clinic for discussion of biopsy.    Past Medical History:  has a past medical history of Allergic rhinitis due to allergen (years ago), Allergic state, Breast cyst, Carpal tunnel syndrome, Carpal tunnel syndrome, COPD (chronic obstructive pulmonary disease) (CMS-HCC), Hemorrhoids, Hypertension, Hyponatremia, Hypothyroidism, Osteoarthritis, Pneumonia (11/2016), Pulmonary nodules, Seasonal allergies, Sinusitis, unspecified, and Tingling in extremities.  Past Surgical History:  has a past surgical history that includes Tonsillectomy; Excision Nasal Polyps; Dilation and curettage, diagnostic / therapeutic; Colonoscopy; Cataract extraction; Appendectomy; and arthroscopic knee surgery.  Family History: family history includes Cancer in her sister; Heart failure in her mother; High blood pressure (Hypertension) in her brother, brother, mother, and sister; Myocardial Infarction (Heart attack) in her brother and father; Other in her sister.  Social History:  reports that she quit smoking about 34 years ago. Her smoking use included cigarettes. She started smoking about 65 years ago. She has a 40.00 pack-year smoking history. She has never used smokeless tobacco. She reports current alcohol use. She reports that she does not use drugs.  Current Medications: has a current medication list which includes the following prescription(s): antiox #8/om3/dha/epa/lut/zeax, atenolol, azelastine, calcium carbonate-vitamin d3, famotidine, levothyroxine, losartan, montelukast, multivitamin-lutein, prednisone, spironolactone, aspirin, and fluticasone propionate.  Allergies:      Allergies  Allergen  Reactions  . Amlodipine Besylate (Bulk) Swelling  . Codeine Sulfate Nausea  . Mobic [Meloxicam] Nausea  . Tramadol Nausea    ROS:  A 15 point review of systems was performed and pertinent positives and negatives noted in HPI   Objective:   BP 146/71   Pulse 82   Temp (!) 35.9 C (96.7 F) (Oral)   Ht 152.4 cm (5')   Wt 56.1 kg (123 lb 9.6 oz)   BMI 24.14 kg/m   Constitutional :  alert, appears stated age, cooperative and no distress  Lymphatics/Throat:  no asymmetry, masses, or scars  Respiratory:  clear to auscultation bilaterally  Cardiovascular:  regular rate and rhythm  Gastrointestinal: soft, non-tender; bowel sounds normal; no masses,  no organomegaly.    Musculoskeletal: Steady gait and movement  Skin: Cool and moist  Psychiatric: Normal affect, non-agitated, not confused       LABS:  n/a   RADS: n/a  Assessment:      Temporal arteritis (CMS-HCC) [M31.6]  Plan:   1. Temporal arteritis (CMS-HCC) [M31.6] Discussed surgical excision.  Alternatives include continued observation.  Benefits include diagnosis confirmation.  Discussed the risk of surgery including bleeding, post-op infxn, poor cosmesis, poor/delayed wound healing, and possible re-operation to address said risks. The risks of general anesthetic, if used, includes MI, CVA, sudden death or even reaction to anesthetic medications also discussed.  Typical post-op recovery time of 3-5 days with possible activity restrictions were also discussed.  The patient verbalized understanding and all questions were answered to the patient's satisfaction.  2. Patient has elected to proceed with biopsy, LEFT side per request.  Procedure will be scheduled.  Written consent was obtained.

## 2018-07-30 NOTE — Pre-Procedure Instructions (Signed)
Component Name 06/19/2018 06/13/2018 04/27/2018 10/03/2017 05/12/2017 02/14/2017 02/09/2017 12/14/2016 12/09/2016 12/08/2016 12/07/2016 12/07/2016 12/07/2016 03/21/2016 03/20/2015 11/13/2014 07/17/2014 06/26/2014 06/18/2014 01/15/2014   112 (H) 107 85 113 (H) 81 93 76 84 87 96 105 109  76 76 83 88 89 104 85  132 (L) 130 (L) 140 134 (L) 140 140 139 138 132 (L) 127 (L) 125 (L) 123 (L)  133 (L) 136 131 (L) 133 (L) 130 (L) 126 (L) 135 (L)  4.3 4.1 4.5 3.9 4.0 4.3 4.4 4.3 3.5 3.5 3.7   3.9 3.8 4 3.9 3.5 (L) 3.2 (L) 4.1  95 (L) 96 (L) 104 99 104 103 102 101 102 95 (L) 94 (L) 91 (L)  96 (L) 99 97 96 (L) 94 (L) 91 (L) 98  27.2 28.6 25.1 25.6 31.6 31.7 29.5 29.0 '24 23 21 22  ' 27.6 30.1 25.8 29.9 24.8 28.9 32.7 (H)  '17 10 17 14 19 15 15 16 7 9 9 10  13 15 19 15 10 12 12  ' 0.9 0.7 0.9 0.7 0.8 0.8 0.8 0.7 0.8 0.7 0.8 0.7  0.8 0.8 0.7 0.7 0.8 0.8 0.8  59 (L) 79 59 (L) 80 68 68 68 80 >60 >60 >60 >60  68 69 80 80 69 69 69  9.5 9.2 9.6 9.9 9.7 9.7 9.5 9.4 9.0 8.9 9.5 9.7  9.8 9.4 9.5 9.8 9.7 9.3 9.'5  22  17 18  19  22      18 13   20  16  ' 36  '14 13  16  ' 33      '15 11   22  15  ' 98  55 68  61  46      51 64   62  57  3.5  4.2 4.2  4.1  3.9   3.7 4.2  3.9 3.9   4.3  4.1  0.3  0.5 0.4  0.5  0.4   0.8   0.6 0.4   0.4  0.4  7.2  6.6 7.8  6.6  6.3   6.2 6.8  6.4 6.8   7.4  6.6  0.9 (L)  1.8 1.2  1.6  1.6      1.6 1.3   1.4  1.6   14.3   23.8 (H)  18.8         27.1 (H) 21.4 (H)  15.0    9.5   8.4  11.9         12.2 11.0  9.'3             27                     25 22                    ' 59                   '6 9 10 10                  9 13 11 14                      ' 2.0         Glucose  Sodium  Potassium  Chloride  Carbon Dioxide (CO2)  Urea Nitrogen (BUN)  Creatinine  Glomerular Filtration Rate (eGFR), MDRD Estimate    Calcium  AST   ALT   Alk Phos (alkaline Phosphatase)  Albumin  Bilirubin, Total  Protein, Total  A/G Ratio  BUN/Crea Ratio  Anion Gap w/K  AST (Aspartate Aminotransferase)  ALT (Alanine Aminotransferase)  Alk Phos (Alkaline Phosphatase)  Anion Gap  BUN/CREA Ratio  Magnesium

## 2018-07-30 NOTE — H&P (Signed)
Subjective:   CC: Temporal arteritis (CMS-HCC) [M31.6]  HPI:  Mary Barker is a 84 y.o. female who was referred by Dorthula Matas* for evaluation of above.   Workup for her symptoms which include left sided face pain done in walk in and by Dr. Jefm Bryant concerning for temporal arteritis so referred to our clinic for discussion of biopsy.    Past Medical History:  has a past medical history of Allergic rhinitis due to allergen (years ago), Allergic state, Breast cyst, Carpal tunnel syndrome, Carpal tunnel syndrome, COPD (chronic obstructive pulmonary disease) (CMS-HCC), Hemorrhoids, Hypertension, Hyponatremia, Hypothyroidism, Osteoarthritis, Pneumonia (11/2016), Pulmonary nodules, Seasonal allergies, Sinusitis, unspecified, and Tingling in extremities.  Past Surgical History:  has a past surgical history that includes Tonsillectomy; Excision Nasal Polyps; Dilation and curettage, diagnostic / therapeutic; Colonoscopy; Cataract extraction; Appendectomy; and arthroscopic knee surgery.  Family History: family history includes Cancer in her sister; Heart failure in her mother; High blood pressure (Hypertension) in her brother, brother, mother, and sister; Myocardial Infarction (Heart attack) in her brother and father; Other in her sister.  Social History:  reports that she quit smoking about 34 years ago. Her smoking use included cigarettes. She started smoking about 65 years ago. She has a 40.00 pack-year smoking history. She has never used smokeless tobacco. She reports current alcohol use. She reports that she does not use drugs.  Current Medications: has a current medication list which includes the following prescription(s): antiox #8/om3/dha/epa/lut/zeax, atenolol, azelastine, calcium carbonate-vitamin d3, famotidine, levothyroxine, losartan, montelukast, multivitamin-lutein, prednisone, spironolactone, aspirin, and fluticasone propionate.  Allergies:      Allergies  Allergen  Reactions  . Amlodipine Besylate (Bulk) Swelling  . Codeine Sulfate Nausea  . Mobic [Meloxicam] Nausea  . Tramadol Nausea    ROS:  A 15 point review of systems was performed and pertinent positives and negatives noted in HPI   Objective:   BP 146/71   Pulse 82   Temp (!) 35.9 C (96.7 F) (Oral)   Ht 152.4 cm (5')   Wt 56.1 kg (123 lb 9.6 oz)   BMI 24.14 kg/m   Constitutional :  alert, appears stated age, cooperative and no distress  Lymphatics/Throat:  no asymmetry, masses, or scars  Respiratory:  clear to auscultation bilaterally  Cardiovascular:  regular rate and rhythm  Gastrointestinal: soft, non-tender; bowel sounds normal; no masses,  no organomegaly.    Musculoskeletal: Steady gait and movement  Skin: Cool and moist  Psychiatric: Normal affect, non-agitated, not confused       LABS:  n/a   RADS: n/a  Assessment:      Temporal arteritis (CMS-HCC) [M31.6]  Plan:   1. Temporal arteritis (CMS-HCC) [M31.6] Discussed surgical excision.  Alternatives include continued observation.  Benefits include diagnosis confirmation.  Discussed the risk of surgery including bleeding, post-op infxn, poor cosmesis, poor/delayed wound healing, and possible re-operation to address said risks. The risks of general anesthetic, if used, includes MI, CVA, sudden death or even reaction to anesthetic medications also discussed.  Typical post-op recovery time of 3-5 days with possible activity restrictions were also discussed.  The patient verbalized understanding and all questions were answered to the patient's satisfaction.  2. Patient has elected to proceed with biopsy, LEFT side per request.  Procedure will be scheduled.  Written consent was obtained.

## 2018-07-30 NOTE — Patient Instructions (Signed)
Your procedure is scheduled on: 07/31/28 Report to Day Surgery [facility]OND FLOOR To find out your arrival time please call [phone] between 1PM - 3PM on 07/30/18  Remember: Instructions that are not followed completely may result in serious medical risk,  up to and including death, or upon the discretion of your surgeon and anesthesiologist your  surgery may need to be rescheduled.     _X__ 1. Do not eat food after midnight the night before your procedure.                 No gum chewing or hard candies. You may drink clear liquids up to 2 hours                 before you are scheduled to arrive for your surgery- DO not drink clear                 liquids within 2 hours of the start of your surgery.                 Clear Liquids include:  water, apple juice without pulp, clear carbohydrate                 drink such as Clearfast of Gatorade, Black Coffee or Tea (Do not add                 anything to coffee or tea).  __X__2.  On the morning of surgery brush your teeth with toothpaste and water, you                may rinse your mouth with mouthwash if you wish.  Do not swallow any toothpaste of mouthwash.     _X__ 3.  No Alcohol for 24 hours before or after surgery.   _X__ 4.  Do Not Smoke or use e-cigarettes For 24 Hours Prior to Your Surgery.                 Do not use any chewable tobacco products for at least 6 hours prior to                 surgery.  ____  5.  Bring all medications with you on the day of surgery if instructed.   __X__  6.  Notify your doctor if there is any change in your medical condition      (cold, fever, infections).     Do not wear jewelry, make-up, hairpins, clips or nail polish. Do not wear lotions, powders, or perfumes. You may wear deodorant. Do not shave 48 hours prior to surgery. Men may shave face and neck. Do not bring valuables to the hospital.    [facility] is not responsible for any belongings or  valuables.  Contacts, dentures or bridgework may not be worn into surgery. Leave your suitcase in the car. After surgery it may be brought to your room. For patients admitted to the hospital, discharge time is determined by your treatment team.   Patients discharged the day of surgery will not be allowed to drive home.      _X___ Take these medicines the morning of surgery with A SIP OF WATER:    1. ATENOLOL  2. LEVOTHYROXINE  3.   4.  5.  6.  ____ Fleet Enema (as directed)   ____ Use CHG Soap as directed  ____ Use inhalers on the day of surgery  ____ Stop metformin 2 days prior to  surgery    ____ Take 1/2 of usual insulin dose the night before surgery. No insulin the morning          of surgery.   ____ Stop Coumadin/Plavix/aspirin on     ALREADY STOPPED ASPIRIN  ____ Stop Anti-inflammatories on    ____ Stop supplements until after surgery.    ____ Bring C-Pap to the hospital.

## 2018-07-31 ENCOUNTER — Ambulatory Visit: Payer: PPO | Admitting: Anesthesiology

## 2018-07-31 ENCOUNTER — Ambulatory Visit
Admission: RE | Admit: 2018-07-31 | Discharge: 2018-07-31 | Disposition: A | Discharge status: Home / Self Care | Payer: PPO | Source: Ambulatory Visit | Attending: Surgery | Admitting: Surgery

## 2018-07-31 ENCOUNTER — Encounter
Admission: RE | Disposition: A | Discharge status: Home / Self Care | Payer: Self-pay | Source: Ambulatory Visit | Attending: Surgery

## 2018-07-31 ENCOUNTER — Encounter: Payer: Self-pay | Admitting: Anesthesiology

## 2018-07-31 ENCOUNTER — Other Ambulatory Visit: Payer: Self-pay

## 2018-07-31 DIAGNOSIS — I771 Stricture of artery: Secondary | ICD-10-CM | POA: Diagnosis not present

## 2018-07-31 DIAGNOSIS — J449 Chronic obstructive pulmonary disease, unspecified: Secondary | ICD-10-CM | POA: Insufficient documentation

## 2018-07-31 DIAGNOSIS — Z87891 Personal history of nicotine dependence: Secondary | ICD-10-CM | POA: Diagnosis not present

## 2018-07-31 DIAGNOSIS — G501 Atypical facial pain: Secondary | ICD-10-CM | POA: Diagnosis not present

## 2018-07-31 DIAGNOSIS — E039 Hypothyroidism, unspecified: Secondary | ICD-10-CM | POA: Diagnosis not present

## 2018-07-31 DIAGNOSIS — M316 Other giant cell arteritis: Secondary | ICD-10-CM | POA: Diagnosis not present

## 2018-07-31 DIAGNOSIS — I10 Essential (primary) hypertension: Secondary | ICD-10-CM | POA: Diagnosis not present

## 2018-07-31 DIAGNOSIS — I708 Atherosclerosis of other arteries: Secondary | ICD-10-CM | POA: Diagnosis not present

## 2018-07-31 HISTORY — PX: ARTERY BIOPSY: SHX891

## 2018-07-31 LAB — POCT I-STAT 4, (NA,K, GLUC, HGB,HCT)
Glucose, Bld: 95 mg/dL (ref 70–99)
HCT: 38 % (ref 36.0–46.0)
Hemoglobin: 12.9 g/dL (ref 12.0–15.0)
Potassium: 3.9 mmol/L (ref 3.5–5.1)
SODIUM: 134 mmol/L — AB (ref 135–145)

## 2018-07-31 SURGERY — BIOPSY TEMPORAL ARTERY
Anesthesia: Monitor Anesthesia Care | Laterality: Left

## 2018-07-31 MED ORDER — FAMOTIDINE 20 MG PO TABS
ORAL_TABLET | ORAL | Status: AC
  Administered 2018-07-31: 20 mg via ORAL
  Filled 2018-07-31: qty 1

## 2018-07-31 MED ORDER — BUPIVACAINE HCL (PF) 0.5 % IJ SOLN
INTRAMUSCULAR | Status: AC
  Filled 2018-07-31: qty 30

## 2018-07-31 MED ORDER — PROPOFOL 500 MG/50ML IV EMUL
INTRAVENOUS | Status: DC | PRN
  Administered 2018-07-31: 25 ug/kg/min via INTRAVENOUS

## 2018-07-31 MED ORDER — ACETAMINOPHEN 500 MG PO TABS
ORAL_TABLET | ORAL | Status: AC
  Administered 2018-07-31: 1000 mg via ORAL
  Filled 2018-07-31: qty 2

## 2018-07-31 MED ORDER — CEFAZOLIN SODIUM-DEXTROSE 2-4 GM/100ML-% IV SOLN
INTRAVENOUS | Status: AC
  Filled 2018-07-31: qty 100

## 2018-07-31 MED ORDER — EPHEDRINE SULFATE 50 MG/ML IJ SOLN
INTRAMUSCULAR | Status: DC | PRN
  Administered 2018-07-31: 10 mg via INTRAVENOUS
  Administered 2018-07-31: 5 mg via INTRAVENOUS

## 2018-07-31 MED ORDER — LIDOCAINE HCL (PF) 2 % IJ SOLN
INTRAMUSCULAR | Status: AC
  Filled 2018-07-31: qty 10

## 2018-07-31 MED ORDER — FENTANYL CITRATE (PF) 100 MCG/2ML IJ SOLN: 25.0000 ug | INTRAMUSCULAR | Status: DC | PRN

## 2018-07-31 MED ORDER — MIDAZOLAM HCL 2 MG/2ML IJ SOLN
INTRAMUSCULAR | Status: DC | PRN
  Administered 2018-07-31: 0.5 mg via INTRAVENOUS

## 2018-07-31 MED ORDER — PROPOFOL 10 MG/ML IV BOLUS
INTRAVENOUS | Status: AC
  Filled 2018-07-31: qty 20

## 2018-07-31 MED ORDER — MIDAZOLAM HCL 2 MG/2ML IJ SOLN
INTRAMUSCULAR | Status: AC
  Filled 2018-07-31: qty 2

## 2018-07-31 MED ORDER — LIDOCAINE-EPINEPHRINE 1 %-1:100000 IJ SOLN
INTRAMUSCULAR | Status: DC | PRN
  Administered 2018-07-31: 2 mL

## 2018-07-31 MED ORDER — LIDOCAINE-EPINEPHRINE 1 %-1:100000 IJ SOLN
INTRAMUSCULAR | Status: AC
  Filled 2018-07-31: qty 1

## 2018-07-31 MED ORDER — PHENYLEPHRINE HCL 10 MG/ML IJ SOLN
INTRAMUSCULAR | Status: DC | PRN
  Administered 2018-07-31: 80 ug via INTRAVENOUS

## 2018-07-31 MED ORDER — FENTANYL CITRATE (PF) 100 MCG/2ML IJ SOLN
INTRAMUSCULAR | Status: AC
  Filled 2018-07-31: qty 2

## 2018-07-31 MED ORDER — LACTATED RINGERS IV SOLN
INTRAVENOUS | Status: DC
  Administered 2018-07-31: 14:00:00 via INTRAVENOUS

## 2018-07-31 MED ORDER — ONDANSETRON HCL 4 MG/2ML IJ SOLN: 4.0000 mg | Freq: Once | INTRAMUSCULAR | Status: DC | PRN

## 2018-07-31 MED ORDER — IBUPROFEN 800 MG PO TABS: 800.0000 mg | ORAL_TABLET | Freq: Three times a day (TID) | ORAL | 0 refills | Status: DC | PRN

## 2018-07-31 MED ORDER — ACETAMINOPHEN 325 MG PO TABS: 650.0000 mg | ORAL_TABLET | Freq: Three times a day (TID) | ORAL | 0 refills | Status: DC | PRN

## 2018-07-31 MED ORDER — FAMOTIDINE 20 MG PO TABS
20.0000 mg | ORAL_TABLET | Freq: Once | ORAL | Status: AC
  Administered 2018-07-31: 20 mg via ORAL

## 2018-07-31 MED ORDER — ACETAMINOPHEN 500 MG PO TABS
1000.0000 mg | ORAL_TABLET | ORAL | Status: AC
  Administered 2018-07-31: 1000 mg via ORAL

## 2018-07-31 MED ORDER — CHLORHEXIDINE GLUCONATE CLOTH 2 % EX PADS: 6.0000 | MEDICATED_PAD | Freq: Once | CUTANEOUS | Status: DC

## 2018-07-31 MED ORDER — BUPIVACAINE HCL (PF) 0.5 % IJ SOLN
INTRAMUSCULAR | Status: DC | PRN
  Administered 2018-07-31: 2 mL

## 2018-07-31 MED ORDER — FENTANYL CITRATE (PF) 100 MCG/2ML IJ SOLN
INTRAMUSCULAR | Status: DC | PRN
  Administered 2018-07-31: 25 ug via INTRAVENOUS

## 2018-07-31 SURGICAL SUPPLY — 43 items
ADH SKN CLS APL DERMABOND .7 (GAUZE/BANDAGES/DRESSINGS) ×1
BLADE SURG 15 STRL LF DISP TIS (BLADE) ×1 IMPLANT
BLADE SURG 15 STRL SS (BLADE) ×2
BLADE SURG SZ11 CARB STEEL (BLADE) ×2 IMPLANT
CNTNR SPEC 2.5X3XGRAD LEK (MISCELLANEOUS)
CONT SPEC 4OZ STER OR WHT (MISCELLANEOUS)
CONT SPEC 4OZ STRL OR WHT (MISCELLANEOUS)
CONTAINER SPEC 2.5X3XGRAD LEK (MISCELLANEOUS) IMPLANT
COTTON BALL STRL MEDIUM (GAUZE/BANDAGES/DRESSINGS) ×2 IMPLANT
COVER WAND RF STERILE (DRAPES) ×2 IMPLANT
DERMABOND ADVANCED (GAUZE/BANDAGES/DRESSINGS) ×1
DERMABOND ADVANCED .7 DNX12 (GAUZE/BANDAGES/DRESSINGS) ×1 IMPLANT
DRAPE LAPAROTOMY 77X122 PED (DRAPES) ×2 IMPLANT
DRSG TELFA 4X3 1S NADH ST (GAUZE/BANDAGES/DRESSINGS) ×2 IMPLANT
ELECT CAUTERY BLADE 6.4 (BLADE) ×2 IMPLANT
ELECT REM PT RETURN 9FT ADLT (ELECTROSURGICAL) ×2
ELECTRODE REM PT RTRN 9FT ADLT (ELECTROSURGICAL) ×1 IMPLANT
GLOVE BIO SURGEON STRL SZ7 (GLOVE) ×2 IMPLANT
GOWN STRL REUS W/ TWL LRG LVL3 (GOWN DISPOSABLE) ×1 IMPLANT
GOWN STRL REUS W/ TWL XL LVL3 (GOWN DISPOSABLE) ×2 IMPLANT
GOWN STRL REUS W/TWL LRG LVL3 (GOWN DISPOSABLE) ×2
GOWN STRL REUS W/TWL XL LVL3 (GOWN DISPOSABLE) ×4
KIT TURNOVER KIT A (KITS) ×2 IMPLANT
LABEL OR SOLS (LABEL) ×2 IMPLANT
NDL HYPO 25X1 1.5 SAFETY (NEEDLE) IMPLANT
NEEDLE HYPO 25X1 1.5 SAFETY (NEEDLE) IMPLANT
NS IRRIG 500ML POUR BTL (IV SOLUTION) ×2 IMPLANT
PACK BASIN MINOR ARMC (MISCELLANEOUS) ×2 IMPLANT
SOL PREP PVP 2OZ (MISCELLANEOUS) ×2
SOLUTION PREP PVP 2OZ (MISCELLANEOUS) ×1 IMPLANT
SUCTION FRAZIER HANDLE 10FR (MISCELLANEOUS) ×1
SUCTION TUBE FRAZIER 10FR DISP (MISCELLANEOUS) ×1 IMPLANT
SUT MNCRL AB 4-0 PS2 18 (SUTURE) ×2 IMPLANT
SUT SILK 2 0 (SUTURE) ×2
SUT SILK 2-0 18XBRD TIE 12 (SUTURE) ×1 IMPLANT
SUT SILK 3 0 (SUTURE) ×2
SUT SILK 3-0 18XBRD TIE 12 (SUTURE) ×1 IMPLANT
SUT SILK 4 0 (SUTURE) ×2
SUT SILK 4-0 18XBRD TIE 12 (SUTURE) ×1 IMPLANT
SUT VIC AB 3-0 SH 27 (SUTURE) ×2
SUT VIC AB 3-0 SH 27X BRD (SUTURE) ×1 IMPLANT
SYR 10ML LL (SYRINGE) IMPLANT
SYR BULB IRRIG 60ML STRL (SYRINGE) ×2 IMPLANT

## 2018-07-31 NOTE — Anesthesia Preprocedure Evaluation (Signed)
Anesthesia Evaluation  Patient identified by MRN, date of birth, ID band Patient awake    Reviewed: Allergy & Precautions, NPO status , Patient's Chart, lab work & pertinent test results, reviewed documented beta blocker date and time   Airway Mallampati: II  TM Distance: >3 FB     Dental  (+) Chipped   Pulmonary pneumonia, resolved, COPD, former smoker,           Cardiovascular hypertension, Pt. on medications and Pt. on home beta blockers      Neuro/Psych  Neuromuscular disease    GI/Hepatic   Endo/Other  Hypothyroidism   Renal/GU      Musculoskeletal  (+) Arthritis ,   Abdominal   Peds  Hematology   Anesthesia Other Findings   Reproductive/Obstetrics                             Anesthesia Physical Anesthesia Plan  ASA: III  Anesthesia Plan: MAC   Post-op Pain Management:    Induction:   PONV Risk Score and Plan:   Airway Management Planned:   Additional Equipment:   Intra-op Plan:   Post-operative Plan:   Informed Consent: I have reviewed the patients History and Physical, chart, labs and discussed the procedure including the risks, benefits and alternatives for the proposed anesthesia with the patient or authorized representative who has indicated his/her understanding and acceptance.     Plan Discussed with: CRNA  Anesthesia Plan Comments:         Anesthesia Quick Evaluation

## 2018-07-31 NOTE — Anesthesia Post-op Follow-up Note (Signed)
Anesthesia QCDR form completed.        

## 2018-07-31 NOTE — Anesthesia Postprocedure Evaluation (Signed)
Anesthesia Post Note  Patient: Mary Barker  Procedure(s) Performed: BIOPSY TEMPORAL ARTERY (Left )  Patient location during evaluation: PACU Anesthesia Type: MAC Level of consciousness: awake and alert Pain management: pain level controlled Vital Signs Assessment: post-procedure vital signs reviewed and stable Respiratory status: spontaneous breathing and respiratory function stable Cardiovascular status: stable Anesthetic complications: no     Last Vitals:  Vitals:   07/31/18 1547 07/31/18 1551  BP: 115/69   Pulse: 67 66  Resp: 20 18  Temp:  36.9 C  SpO2: 100% 99%    Last Pain:  Vitals:   07/31/18 1527  TempSrc:   PainSc: 0-No pain                 KEPHART,WILLIAM K

## 2018-07-31 NOTE — Transfer of Care (Signed)
Immediate Anesthesia Transfer of Care Note  Patient: Mary Barker  Procedure(s) Performed: BIOPSY TEMPORAL ARTERY (Left )  Patient Location: PACU  Anesthesia Type:General  Level of Consciousness: awake and oriented  Airway & Oxygen Therapy: Patient Spontanous Breathing and Patient connected to nasal cannula oxygen  Post-op Assessment: Report given to RN and Post -op Vital signs reviewed and stable  Post vital signs: Reviewed and stable  Last Vitals:  Vitals Value Taken Time  BP 124/54 07/31/2018  3:27 PM  Temp    Pulse 65 07/31/2018  3:27 PM  Resp 25 07/31/2018  3:27 PM  SpO2 100 % 07/31/2018  3:27 PM  Vitals shown include unvalidated device data.  Last Pain:  Vitals:   07/31/18 1121  TempSrc: Temporal         Complications: No apparent anesthesia complications

## 2018-07-31 NOTE — Anesthesia Procedure Notes (Signed)
Performed by: Allean Found, CRNA Pre-anesthesia Checklist: Patient identified, Emergency Drugs available, Suction available, Patient being monitored and Timeout performed Oxygen Delivery Method: Nasal cannula Placement Confirmation: positive ETCO2

## 2018-07-31 NOTE — Op Note (Signed)
Pre-Op Dx: temporal arteritis Post-Op Dx: same  Surgeon: [name] Anesthesia: local/MAC EBL: minimal Complications:  none apparent Specimen: LEFT temporal artery biopsy Procedure: excisional biopsy of Left temporal artery  Indication for procedure: Patient presenting with symptoms of possible temporal arteritis started on steroids and referred to [place] for temporal artery biopsy.  Description of Procedure:  Patient wheeled into the OR transferred to the OR table in supine position.  MAC anesthesia administered.  SCDs placed antibiotics started. Area sterilized and draped in usual position.  Timeout performed.  Doppler was used to identify location of the temporal artery.  Area marked prior to infusing local surrounding the artery.  5cm incision made through dermis with 15blade and dissection carried down through subcutaneous layer into the fascia where the temporal artery was noted.  A 2 cm segment was dissected off the surrounding tissue, and and suture-ligated using 3-0 silk.  The segment was then sharply excised and then removed off operative field pending pathology.   Wound hemostasis noted, then closed in two layer fashion with 4-0 vicryl in interrupted fashion for deep fascia layer, then running 5-0 monocryl in subcuticular fashion for epidermal layer.  Wound then dressed with dermabond.  Pt tolerated procedure well, and transferred to PACU in stable condition. Sponge and instrument count correct at end of procedure.

## 2018-07-31 NOTE — Discharge Instructions (Signed)
Temporal Artery Biopsy, Care After Refer to this sheet in the next few weeks. These instructions provide you with information about caring for yourself after your procedure. Your health care provider may also give you more specific instructions. Your treatment has been planned according to current medical practices, but problems sometimes occur. Call your health care provider if you have any problems or questions after your procedure. What can I expect after the procedure? After the procedure, it is common to have:  Soreness.  Bruising.  Numbness.  Swelling.  Follow these instructions at home: Incision care  Follow instructions from your health care provider about how to take care of the cut made during surgery (incision). Make sure you: ? Wash your hands with soap and water before you change your bandage (dressing). If soap and water are not available, use hand sanitizer. ? Change your dressing as told by your health care provider. ? Leave stitches (sutures), skin glue, or adhesive strips in place. These skin closures may need to stay in place for 2 weeks or longer. If adhesive strip edges start to loosen and curl up, you may trim the loose edges. Do not remove adhesive strips completely unless your health care provider tells you to do that.  Check your incision area every day for signs of infection. Watch for: ? More redness, swelling, or pain. ? More fluid or blood. ? Warmth. ? Pus or a bad smell. Activity  Do not do any physical work or strenuous exercise until your health care provider approves.  Do not lift anything that is heavier than 10 lb (4.5 kg). General instructions   tylenol and advil as needed for discomfort.  Please alternate between the two every four hours as needed for pain.    325-650mg  every 8hrs to max of 4000mg /24hrs for the tylenol.    Advil up to 800mg  per dose every 8hrs as needed for pain.    Follow your health care provider's instructions on bathing or  showering.  Keep all follow-up visits as told by your health care provider. This is important. Contact a health care provider if:  Medicine does not help your pain.  You have a fever or chills.  You have more redness, swelling, or pain around your incision site.  You have more fluid or blood coming from your incision site.  Your incision feels warm to the touch.  You have pus or a bad smell coming from your incision site.  You have a fever.  You develop nausea or vomiting. Get help right away if:  You have bleeding from the incision that does not stop after 30 minutes of applying heavy pressure.  You have chest pain.  You have shortness of breath.  You faint.  You have sudden vision loss.  You develop weakness or drooping in your face or eye. This information is not intended to replace advice given to you by your health care provider. Make sure you discuss any questions you have with your health care provider.  AMBULATORY SURGERY  DISCHARGE INSTRUCTIONS   1) The drugs that you were given will stay in your system until tomorrow so for the next 24 hours you should not:  A) Drive an automobile B) Make any legal decisions C) Drink any alcoholic beverage   2) You may resume regular meals tomorrow.  Today it is better to start with liquids and gradually work up to solid foods.  You may eat anything you prefer, but it is better to start with  liquids, then soup and crackers, and gradually work up to solid foods.   3) Please notify your doctor immediately if you have any unusual bleeding, trouble breathing, redness and pain at the surgery site, drainage, fever, or pain not relieved by medication.    4) Additional Instructions:   Please contact your physician with any problems or Same Day Surgery at [phone], Monday through Friday 6 am to 4 pm, or [place] at [facility] number at [phone].

## 2018-07-31 NOTE — Interval H&P Note (Signed)
History and Physical Interval Note:  07/31/2018 11:54 AM  Mary Barker  has presented today for surgery, with the diagnosis of TEMPORAL ARTERIES  The various methods of treatment have been discussed with the patient and family. After consideration of risks, benefits and other options for treatment, the patient has consented to  Procedure(s): BIOPSY TEMPORAL ARTERY (N/A) as a surgical intervention .  The patient's history has been reviewed, patient examined, stable for surgery.  I have reviewed the patient's chart and labs.  Questions were answered to the patient's satisfaction.  Patient did states she has not been taking her prednisone over the past couple days due to increased nausea.  She called into the rheumatologist office and was prescribed some Zofran and Prilosec, but has not resumed the prednisone along with the new medications.  I strongly advised her to continue the steroids as prescribed and if she cannot tolerated to reach out back to her rheumatologist for further recommendations.  I explained to her that waiting for the biopsy results to come back prior to treating temporal arteritis can be detrimental and can have some long-lasting consequences.  She verbalized understanding and stated that she will consider resuming her steroids at this point.   Mary Barker Mary Barker

## 2018-08-01 ENCOUNTER — Encounter: Payer: Self-pay | Admitting: Surgery

## 2018-08-01 ENCOUNTER — Telehealth: Payer: Self-pay | Admitting: General Surgery

## 2018-08-01 LAB — SURGICAL PATHOLOGY

## 2018-08-01 NOTE — Telephone Encounter (Signed)
Received call from [facility] answering service that Mary Barker had called with concerns of feeling dizzy and lightheaded. She underwent a temporal artery biopsy with Dr. [name] yesterday.  She states that she had been doing fine after her return home yesterday.  She got up this morning and took her usual medications including prednisone and Prilosec and still continues to do fine.  She says that then she got up to go to the restroom and felt extremely weak and lightheaded.  She denies any syncope but felt like she might pass out.  She sat down and contacted the answering service who then contacted me.  Ms. Herskowitz states that she now is feeling better but is concerned that she may have another episode. She does say that she is not taking any narcotic pain medication and has, so far, consumed about 36 ounces of fluid today.  Upon further questioning, she states that her son lives nearby.  I suggested that perhaps either her son could come to stay with her or she could go stay with her son for a night or so for additional observation.  Alternatively, she is, of course welcome to come to the emergency department for evaluation. She is very reluctant to come into the emergency department.  We discussed ways to get up slowly, as I suspect she is experiencing orthostatic hypotension.  She says that she has a Life Alert button and is currently wearing it.  For now, she prefers to stay at home.  I did recommend that, should she have another episode when she next tries to get up, that she should present to the emergency department for evaluation.

## 2018-08-01 NOTE — Progress Notes (Signed)
Patient called today with c/o headache, nausea, and dizziness s/p temporal artery biopsy from yesterday.  Patient reports just "not feeling well, different from yesterday".  Instructed patient to call the office and leave message with answering service.  Also instructed her to go to the Emergency Department if symptoms worsen or she is unable to reach Dr. [name] or one physician on call.

## 2018-08-07 ENCOUNTER — Encounter: Payer: PPO | Admitting: Oncology

## 2018-08-07 DIAGNOSIS — H353211 Exudative age-related macular degeneration, right eye, with active choroidal neovascularization: Secondary | ICD-10-CM | POA: Diagnosis not present

## 2018-08-08 DIAGNOSIS — R5383 Other fatigue: Secondary | ICD-10-CM | POA: Diagnosis not present

## 2018-08-08 DIAGNOSIS — R634 Abnormal weight loss: Secondary | ICD-10-CM | POA: Diagnosis not present

## 2018-08-08 DIAGNOSIS — R700 Elevated erythrocyte sedimentation rate: Secondary | ICD-10-CM | POA: Diagnosis not present

## 2018-08-12 DIAGNOSIS — D72829 Elevated white blood cell count, unspecified: Secondary | ICD-10-CM | POA: Insufficient documentation

## 2018-08-12 NOTE — Progress Notes (Signed)
[place]  [phone] [phone]  ID: Mary Barker OB: [birth year]  MR#: [id number]  LYY#:[id number]  Patient Care Team: Maryland Pink, MD as PCP - General (Family Medicine)  CHIEF COMPLAINT: Leukocytosis.  INTERVAL HISTORY: Patient is a 84 year old female who was noted to have persistently increased white blood cell count on routine blood work.  She also has been on prednisone as recently as 2 days ago.  She currently feels well and is asymptomatic.  She has no neurologic complaints.  She denies any recent fevers or illnesses.  She has a good appetite and denies weight loss.  She has no chest pain or shortness of breath.  She denies any nausea, vomiting, constipation, or diarrhea.  She has no melena or hematochezia.  She has no urinary complaints.  Patient feels at her baseline offers no specific complaints today.  REVIEW OF SYSTEMS:   Review of Systems  Constitutional: Negative.  Negative for fever, malaise/fatigue and weight loss.  Respiratory: Negative.  Negative for cough, hemoptysis and shortness of breath.   Cardiovascular: Negative.  Negative for chest pain and leg swelling.  Gastrointestinal: Negative.  Negative for abdominal pain, blood in stool and melena.  Genitourinary: Negative.  Negative for dysuria.  Musculoskeletal: Negative.  Negative for back pain.  Skin: Negative.  Negative for rash.  Neurological: Negative.  Negative for focal weakness, weakness and headaches.  Psychiatric/Behavioral: Negative.  The patient is not nervous/anxious.     As per HPI. Otherwise, a complete review of systems is negative.  PAST MEDICAL HISTORY: Past Medical History:  Diagnosis Date  . Carpal tunnel syndrome   . COPD (chronic obstructive pulmonary disease) ([place])   . H/O multiple pulmonary nodules   . Hypertension   . Hyponatremia   . Hypothyroidism   . Osteoarthritis   . Thyroid disease   . Tingling in extremities     PAST SURGICAL  HISTORY: Past Surgical History:  Procedure Laterality Date  . APPENDECTOMY    . ARTERY BIOPSY Left 07/31/2018   Procedure: BIOPSY TEMPORAL ARTERY;  Surgeon: Benjamine Sprague, DO;  Location: [facility] ORS;  Service: General;  Laterality: Left;  . BREAST EXCISIONAL BIOPSY Left 1954   neg  . BREAST SURGERY    . COLONOSCOPY    . DILATION AND CURETTAGE OF UTERUS    . EYE SURGERY    . KNEE ARTHROSCOPY    . NASAL POLYP SURGERY    . TONSILLECTOMY      FAMILY HISTORY: Family History  Problem Relation Age of Onset  . CAD Father   . CAD Sister   . CAD Brother     ADVANCED DIRECTIVES (Y/N):  N  HEALTH MAINTENANCE: Social History   Tobacco Use  . Smoking status: Former Smoker    Types: Cigarettes  . Smokeless tobacco: Never Used  Substance Use Topics  . Alcohol use: Yes    Comment: pt states one drink a day  . Drug use: No     Colonoscopy:  PAP:  Bone density:  Lipid panel:  Allergies  Allergen Reactions  . Codeine Nausea And Vomiting    Current Outpatient Medications  Medication Sig Dispense Refill  . aspirin 81 MG tablet Take 81 mg by mouth daily.     Marland Kitchen atenolol (TENORMIN) 25 MG tablet Take 25 mg by mouth daily.    . Azelastine HCl 137 MCG/SPRAY SOLN Place 1 spray into both nostrils 2 (two) times daily.    . Calcium Carbonate-Vitamin D (SM CALCIUM 500/VITAMIN  D3 PO) Take 1 tablet by mouth 2 (two) times daily.    Marland Kitchen levothyroxine (SYNTHROID, LEVOTHROID) 125 MCG tablet Take 125 mcg by mouth daily before breakfast.     . losartan (COZAAR) 50 MG tablet Take 50 mg by mouth daily.    . montelukast (SINGULAIR) 10 MG tablet Take 10 mg by mouth at bedtime.    . Multiple Vitamins-Minerals (CENTRUM SILVER PO) Take 1 tablet by mouth daily.    . Multiple Vitamins-Minerals (PRESERVISION AREDS 2 PO) Take 1 capsule by mouth 2 (two) times daily.    Marland Kitchen spironolactone (ALDACTONE) 25 MG tablet Take 25 mg by mouth daily.    Marland Kitchen ibuprofen (ADVIL,MOTRIN) 800 MG tablet Take 1 tablet (800 mg total) by  mouth every 8 (eight) hours as needed for mild pain or moderate pain. (Patient not taking: Reported on 08/14/2018) 30 tablet 0   No current facility-administered medications for this visit.     OBJECTIVE: Vitals:   08/14/18 1349  BP: 139/69  Pulse: 99  Temp: 97.8 F (36.6 C)     Body mass index is 24.08 kg/m.    ECOG FS:0 - Asymptomatic  General: Well-developed, well-nourished, no acute distress. Eyes: Pink conjunctiva, anicteric sclera. HEENT: Normocephalic, moist mucous membranes, clear oropharnyx. Lungs: Clear to auscultation bilaterally. Heart: Regular rate and rhythm. No rubs, murmurs, or gallops. Abdomen: Soft, nontender, nondistended. No organomegaly noted, normoactive bowel sounds. Musculoskeletal: No edema, cyanosis, or clubbing. Neuro: Alert, answering all questions appropriately. Cranial nerves grossly intact. Skin: No rashes or petechiae noted. Psych: Normal affect. Lymphatics: No cervical, calvicular, axillary or inguinal LAD.   LAB RESULTS:  Lab Results  Component Value Date   NA 134 (L) 07/31/2018   K 3.9 07/31/2018   CL 103 12/06/2016   CO2 24 12/06/2016   GLUCOSE 95 07/31/2018   BUN 13 12/06/2016   CREATININE 0.79 12/06/2016   CALCIUM 8.7 (L) 12/06/2016   PROT 7.4 11/28/2016   ALBUMIN 4.1 11/28/2016   AST 26 11/28/2016   ALT 21 11/28/2016   ALKPHOS 66 11/28/2016   BILITOT 0.6 11/28/2016   GFRNONAA >60 12/06/2016   GFRAA >60 12/06/2016    Lab Results  Component Value Date   WBC 13.7 (H) 08/14/2018   NEUTROABS 10.6 (H) 08/14/2018   HGB 12.9 08/14/2018   HCT 40.2 08/14/2018   MCV 90.7 08/14/2018   PLT 210 08/14/2018     STUDIES: No results found.  ASSESSMENT: Leukocytosis  PLAN:    1. Leukocytosis: Patient's white blood cell count remains elevated at 13.7, but is now trending down.  Patient reports she was on prednisone recently which possibly could be the etiology of her leukocytosis.  Peripheral blood flow cytometry and BCR-ABL  mutation have been ordered for completeness.  These are pending at time of dictation.  No intervention is needed at this time.  Return to clinic in 3 months with repeat laboratory work and further evaluation. 2.  Elevated ferritin: Likely secondary to acute phase reactant.  Monitor.  I spent a total of 45 minutes face-to-face with the patient of which greater than 50% of the visit was spent in counseling and coordination of care as detailed above.   Patient expressed understanding and was in agreement with this plan. She also understands that She can call clinic at any time with any questions, concerns, or complaints.    Lloyd Huger, MD   08/17/2018 6:45 AM

## 2018-08-14 ENCOUNTER — Other Ambulatory Visit: Payer: Self-pay

## 2018-08-14 ENCOUNTER — Inpatient Hospital Stay: Payer: PPO

## 2018-08-14 ENCOUNTER — Inpatient Hospital Stay: Payer: PPO | Attending: Oncology | Admitting: Oncology

## 2018-08-14 DIAGNOSIS — D72829 Elevated white blood cell count, unspecified: Secondary | ICD-10-CM | POA: Diagnosis not present

## 2018-08-14 DIAGNOSIS — Z87891 Personal history of nicotine dependence: Secondary | ICD-10-CM | POA: Diagnosis not present

## 2018-08-14 DIAGNOSIS — Z92241 Personal history of systemic steroid therapy: Secondary | ICD-10-CM | POA: Diagnosis not present

## 2018-08-14 DIAGNOSIS — R7989 Other specified abnormal findings of blood chemistry: Secondary | ICD-10-CM

## 2018-08-14 LAB — CBC WITH DIFFERENTIAL/PLATELET
ABS IMMATURE GRANULOCYTES: 0.12 10*3/uL — AB (ref 0.00–0.07)
BASOS ABS: 0 10*3/uL (ref 0.0–0.1)
Basophils Relative: 0 %
Eosinophils Absolute: 0.1 10*3/uL (ref 0.0–0.5)
Eosinophils Relative: 1 %
HCT: 40.2 % (ref 36.0–46.0)
HEMOGLOBIN: 12.9 g/dL (ref 12.0–15.0)
Immature Granulocytes: 1 %
LYMPHS PCT: 12 %
Lymphs Abs: 1.6 10*3/uL (ref 0.7–4.0)
MCH: 29.1 pg (ref 26.0–34.0)
MCHC: 32.1 g/dL (ref 30.0–36.0)
MCV: 90.7 fL (ref 80.0–100.0)
Monocytes Absolute: 1.2 10*3/uL — ABNORMAL HIGH (ref 0.1–1.0)
Monocytes Relative: 9 %
NEUTROS ABS: 10.6 10*3/uL — AB (ref 1.7–7.7)
NRBC: 0 % (ref 0.0–0.2)
Neutrophils Relative %: 77 %
Platelets: 210 10*3/uL (ref 150–400)
RBC: 4.43 MIL/uL (ref 3.87–5.11)
RDW: 17.1 % — ABNORMAL HIGH (ref 11.5–15.5)
WBC: 13.7 10*3/uL — ABNORMAL HIGH (ref 4.0–10.5)

## 2018-08-14 LAB — IRON AND TIBC
Iron: 17 ug/dL — ABNORMAL LOW (ref 28–170)
Saturation Ratios: 6 % — ABNORMAL LOW (ref 10.4–31.8)
TIBC: 293 ug/dL (ref 250–450)
UIBC: 276 ug/dL

## 2018-08-14 LAB — LACTATE DEHYDROGENASE: LDH: 130 U/L (ref 98–192)

## 2018-08-14 LAB — FERRITIN: Ferritin: 319 ng/mL — ABNORMAL HIGH (ref 11–307)

## 2018-08-14 NOTE — Progress Notes (Signed)
Patient is here today as a new patient for  Leukocytosis. Patient does not have any concerns but has several questions to ask.

## 2018-08-16 DIAGNOSIS — H903 Sensorineural hearing loss, bilateral: Secondary | ICD-10-CM | POA: Diagnosis not present

## 2018-08-20 DIAGNOSIS — H903 Sensorineural hearing loss, bilateral: Secondary | ICD-10-CM | POA: Diagnosis not present

## 2018-08-21 LAB — BCR-ABL1, CML/ALL, PCR, QUANT

## 2018-09-03 ENCOUNTER — Inpatient Hospital Stay
Admission: EM | Admit: 2018-09-03 | Discharge: 2018-09-05 | DRG: 547 | Disposition: A | Discharge status: Home / Self Care | Payer: PPO | Source: Ambulatory Visit | Attending: Specialist | Admitting: Specialist

## 2018-09-03 ENCOUNTER — Encounter: Payer: Self-pay | Admitting: Intensive Care

## 2018-09-03 ENCOUNTER — Other Ambulatory Visit: Payer: Self-pay

## 2018-09-03 ENCOUNTER — Emergency Department: Payer: PPO

## 2018-09-03 DIAGNOSIS — Z885 Allergy status to narcotic agent status: Secondary | ICD-10-CM

## 2018-09-03 DIAGNOSIS — Z79899 Other long term (current) drug therapy: Secondary | ICD-10-CM

## 2018-09-03 DIAGNOSIS — H5462 Unqualified visual loss, left eye, normal vision right eye: Secondary | ICD-10-CM | POA: Diagnosis not present

## 2018-09-03 DIAGNOSIS — N951 Menopausal and female climacteric states: Secondary | ICD-10-CM | POA: Diagnosis not present

## 2018-09-03 DIAGNOSIS — E039 Hypothyroidism, unspecified: Secondary | ICD-10-CM | POA: Diagnosis not present

## 2018-09-03 DIAGNOSIS — Z8249 Family history of ischemic heart disease and other diseases of the circulatory system: Secondary | ICD-10-CM | POA: Diagnosis not present

## 2018-09-03 DIAGNOSIS — M1990 Unspecified osteoarthritis, unspecified site: Secondary | ICD-10-CM | POA: Diagnosis present

## 2018-09-03 DIAGNOSIS — Z79890 Hormone replacement therapy (postmenopausal): Secondary | ICD-10-CM

## 2018-09-03 DIAGNOSIS — Z7982 Long term (current) use of aspirin: Secondary | ICD-10-CM | POA: Diagnosis not present

## 2018-09-03 DIAGNOSIS — M316 Other giant cell arteritis: Principal | ICD-10-CM | POA: Diagnosis present

## 2018-09-03 DIAGNOSIS — G4700 Insomnia, unspecified: Secondary | ICD-10-CM | POA: Diagnosis not present

## 2018-09-03 DIAGNOSIS — J449 Chronic obstructive pulmonary disease, unspecified: Secondary | ICD-10-CM | POA: Diagnosis not present

## 2018-09-03 DIAGNOSIS — D6959 Other secondary thrombocytopenia: Secondary | ICD-10-CM | POA: Diagnosis present

## 2018-09-03 DIAGNOSIS — I10 Essential (primary) hypertension: Secondary | ICD-10-CM | POA: Diagnosis present

## 2018-09-03 DIAGNOSIS — Z87891 Personal history of nicotine dependence: Secondary | ICD-10-CM | POA: Diagnosis not present

## 2018-09-03 DIAGNOSIS — G5600 Carpal tunnel syndrome, unspecified upper limb: Secondary | ICD-10-CM | POA: Diagnosis not present

## 2018-09-03 DIAGNOSIS — H353122 Nonexudative age-related macular degeneration, left eye, intermediate dry stage: Secondary | ICD-10-CM | POA: Diagnosis not present

## 2018-09-03 DIAGNOSIS — I6523 Occlusion and stenosis of bilateral carotid arteries: Secondary | ICD-10-CM | POA: Diagnosis not present

## 2018-09-03 LAB — CBC WITH DIFFERENTIAL/PLATELET
Abs Immature Granulocytes: 0.29 10*3/uL — ABNORMAL HIGH (ref 0.00–0.07)
Basophils Absolute: 0.1 10*3/uL (ref 0.0–0.1)
Basophils Relative: 0 %
EOS ABS: 0 10*3/uL (ref 0.0–0.5)
Eosinophils Relative: 0 %
HEMATOCRIT: 34 % — AB (ref 36.0–46.0)
Hemoglobin: 10.8 g/dL — ABNORMAL LOW (ref 12.0–15.0)
IMMATURE GRANULOCYTES: 2 %
LYMPHS ABS: 1.4 10*3/uL (ref 0.7–4.0)
LYMPHS PCT: 12 %
MCH: 27.8 pg (ref 26.0–34.0)
MCHC: 31.8 g/dL (ref 30.0–36.0)
MCV: 87.6 fL (ref 80.0–100.0)
Monocytes Absolute: 1.2 10*3/uL — ABNORMAL HIGH (ref 0.1–1.0)
Monocytes Relative: 10 %
NEUTROS PCT: 76 %
Neutro Abs: 9 10*3/uL — ABNORMAL HIGH (ref 1.7–7.7)
Platelets: 500 10*3/uL — ABNORMAL HIGH (ref 150–400)
RBC: 3.88 MIL/uL (ref 3.87–5.11)
RDW: 16.5 % — AB (ref 11.5–15.5)
WBC: 11.9 10*3/uL — ABNORMAL HIGH (ref 4.0–10.5)
nRBC: 0 % (ref 0.0–0.2)

## 2018-09-03 LAB — COMPREHENSIVE METABOLIC PANEL
ALBUMIN: 3.3 g/dL — AB (ref 3.5–5.0)
ALK PHOS: 70 U/L (ref 38–126)
ALT: 18 U/L (ref 0–44)
AST: 18 U/L (ref 15–41)
Anion gap: 11 (ref 5–15)
BUN: 11 mg/dL (ref 8–23)
CO2: 24 mmol/L (ref 22–32)
Calcium: 9.2 mg/dL (ref 8.9–10.3)
Chloride: 100 mmol/L (ref 98–111)
Creatinine, Ser: 0.71 mg/dL (ref 0.44–1.00)
GFR calc Af Amer: >60 mL/min (ref >60)
GLUCOSE: 118 mg/dL — AB (ref 70–99)
POTASSIUM: 4.2 mmol/L (ref 3.5–5.1)
Sodium: 135 mmol/L (ref 135–145)
TOTAL PROTEIN: 7.3 g/dL (ref 6.5–8.1)
Total Bilirubin: 0.6 mg/dL (ref 0.3–1.2)

## 2018-09-03 LAB — SEDIMENTATION RATE: SED RATE: 86 mm/h — AB (ref 0–30)

## 2018-09-03 LAB — C-REACTIVE PROTEIN: CRP: 11.4 mg/dL — ABNORMAL HIGH (ref <1.0)

## 2018-09-03 MED ORDER — ONDANSETRON HCL 4 MG/2ML IJ SOLN: 4.0000 mg | Freq: Four times a day (QID) | INTRAMUSCULAR | Status: DC | PRN

## 2018-09-03 MED ORDER — MONTELUKAST SODIUM 10 MG PO TABS
10.0000 mg | ORAL_TABLET | Freq: Every day | ORAL | Status: DC
  Administered 2018-09-03 – 2018-09-04 (×2): 10 mg via ORAL
  Filled 2018-09-03 (×2): qty 1

## 2018-09-03 MED ORDER — CALCIUM CARBONATE-VITAMIN D 500-200 MG-UNIT PO TABS
1.0000 | ORAL_TABLET | Freq: Two times a day (BID) | ORAL | Status: DC
  Administered 2018-09-03 – 2018-09-05 (×5): 1 via ORAL
  Filled 2018-09-03 (×5): qty 1

## 2018-09-03 MED ORDER — SPIRONOLACTONE 25 MG PO TABS
25.0000 mg | ORAL_TABLET | Freq: Every day | ORAL | Status: DC
  Administered 2018-09-04 – 2018-09-05 (×2): 25 mg via ORAL
  Filled 2018-09-03 (×2): qty 1

## 2018-09-03 MED ORDER — LEVOTHYROXINE SODIUM 125 MCG PO TABS
125.0000 ug | ORAL_TABLET | Freq: Every day | ORAL | Status: DC
  Administered 2018-09-04 – 2018-09-05 (×2): 125 ug via ORAL
  Filled 2018-09-03 (×2): qty 1

## 2018-09-03 MED ORDER — LOSARTAN POTASSIUM 50 MG PO TABS
50.0000 mg | ORAL_TABLET | Freq: Every day | ORAL | Status: DC
  Administered 2018-09-04 – 2018-09-05 (×2): 50 mg via ORAL
  Filled 2018-09-03 (×2): qty 1

## 2018-09-03 MED ORDER — ACETAMINOPHEN 650 MG RE SUPP
650.0000 mg | Freq: Four times a day (QID) | RECTAL | Status: DC | PRN
  Filled 2018-09-03: qty 1

## 2018-09-03 MED ORDER — ASPIRIN 81 MG PO CHEW
81.0000 mg | CHEWABLE_TABLET | Freq: Every evening | ORAL | Status: DC
  Administered 2018-09-03 – 2018-09-04 (×2): 81 mg via ORAL
  Filled 2018-09-03 (×3): qty 1

## 2018-09-03 MED ORDER — ENOXAPARIN SODIUM 40 MG/0.4ML ~~LOC~~ SOLN
40.0000 mg | SUBCUTANEOUS | Status: DC
  Filled 2018-09-03: qty 0.4

## 2018-09-03 MED ORDER — ACETAMINOPHEN 325 MG PO TABS: 650.0000 mg | ORAL_TABLET | Freq: Four times a day (QID) | ORAL | Status: DC | PRN

## 2018-09-03 MED ORDER — ATENOLOL 25 MG PO TABS
25.0000 mg | ORAL_TABLET | Freq: Every evening | ORAL | Status: DC
  Administered 2018-09-03 – 2018-09-04 (×2): 25 mg via ORAL
  Filled 2018-09-03 (×3): qty 1

## 2018-09-03 MED ORDER — IOHEXOL 350 MG/ML SOLN
75.0000 mL | Freq: Once | INTRAVENOUS | Status: AC | PRN
  Administered 2018-09-03: 75 mL via INTRAVENOUS
  Filled 2018-09-03: qty 75

## 2018-09-03 MED ORDER — OCUVITE-LUTEIN PO CAPS
1.0000 | ORAL_CAPSULE | Freq: Two times a day (BID) | ORAL | Status: DC
  Administered 2018-09-03 – 2018-09-05 (×3): 1 via ORAL
  Filled 2018-09-03 (×6): qty 1

## 2018-09-03 MED ORDER — SODIUM CHLORIDE 0.9 % IV SOLN
1000.0000 mg | Freq: Once | INTRAVENOUS | Status: AC
  Administered 2018-09-03: 1000 mg via INTRAVENOUS
  Filled 2018-09-03: qty 8

## 2018-09-03 MED ORDER — ONDANSETRON HCL 4 MG PO TABS
4.0000 mg | ORAL_TABLET | Freq: Four times a day (QID) | ORAL | Status: DC | PRN
  Filled 2018-09-03: qty 1

## 2018-09-03 MED ORDER — SODIUM CHLORIDE 0.9 % IV SOLN
1000.0000 mg | Freq: Every day | INTRAVENOUS | Status: AC
  Administered 2018-09-04 – 2018-09-05 (×2): 1000 mg via INTRAVENOUS
  Filled 2018-09-03 (×4): qty 8

## 2018-09-03 MED ORDER — ADULT MULTIVITAMIN W/MINERALS CH
1.0000 | ORAL_TABLET | Freq: Every evening | ORAL | Status: DC
  Administered 2018-09-04: 18:00:00 1 via ORAL
  Filled 2018-09-03: qty 1

## 2018-09-03 NOTE — ED Provider Notes (Signed)
[facility] Emergency Department Provider Note   ____________________________________________    I have reviewed the triage vital signs and the nursing notes.   HISTORY  Chief Complaint No chief complaint on file.     HPI Mary Barker is a 84 y.o. female sent to the emergency department by her ophthalmologist for presumed giant cell arteritis.  Patient has had significant vision loss in the left eye which occurred yesterday.  Did have biopsy 1 month ago which was reportedly negative for giant cell arteritis regardless ophthalmologist has recommended IV steroids, admission, CT, CTA head.  She denies headache.  No neuro deficits.  No nausea or vomiting.  No trauma.   Past Medical History:  Diagnosis Date  . Carpal tunnel syndrome   . COPD (chronic obstructive pulmonary disease) ([place])   . H/O multiple pulmonary nodules   . Hypertension   . Hyponatremia   . Hypothyroidism   . Osteoarthritis   . Thyroid disease   . Tingling in extremities     Patient Active Problem List   Diagnosis Date Noted  . Leukocytosis 08/12/2018  . Pneumonia 12/04/2016    Past Surgical History:  Procedure Laterality Date  . APPENDECTOMY    . ARTERY BIOPSY Left 07/31/2018   Procedure: BIOPSY TEMPORAL ARTERY;  Surgeon: Benjamine Sprague, DO;  Location: [facility] ORS;  Service: General;  Laterality: Left;  . BREAST EXCISIONAL BIOPSY Left 1954   neg  . BREAST SURGERY    . COLONOSCOPY    . DILATION AND CURETTAGE OF UTERUS    . EYE SURGERY    . KNEE ARTHROSCOPY    . NASAL POLYP SURGERY    . TONSILLECTOMY      Prior to Admission medications   Medication Sig Start Date End Date Taking? Authorizing Provider  aspirin 81 MG tablet Take 81 mg by mouth daily.     [provider]  atenolol (TENORMIN) 25 MG tablet Take 25 mg by mouth daily.    [provider]  Azelastine HCl 137 MCG/SPRAY SOLN Place 1 spray into both nostrils 2 (two) times daily. 06/22/18    [provider]  Calcium Carbonate-Vitamin D (SM CALCIUM 500/VITAMIN D3 PO) Take 1 tablet by mouth 2 (two) times daily.    [provider]  ibuprofen (ADVIL,MOTRIN) 800 MG tablet Take 1 tablet (800 mg total) by mouth every 8 (eight) hours as needed for mild pain or moderate pain. Patient not taking: Reported on 08/14/2018 07/31/18   Benjamine Sprague, DO  levothyroxine (SYNTHROID, LEVOTHROID) 125 MCG tablet Take 125 mcg by mouth daily before breakfast.  10/13/16   [provider]  losartan (COZAAR) 50 MG tablet Take 50 mg by mouth daily.    [provider]  montelukast (SINGULAIR) 10 MG tablet Take 10 mg by mouth at bedtime.    [provider]  Multiple Vitamins-Minerals (CENTRUM SILVER PO) Take 1 tablet by mouth daily.    [provider]  Multiple Vitamins-Minerals (PRESERVISION AREDS 2 PO) Take 1 capsule by mouth 2 (two) times daily.    [provider]  spironolactone (ALDACTONE) 25 MG tablet Take 25 mg by mouth daily.    [provider]     Allergies Codeine  Family History  Problem Relation Age of Onset  . CAD Father   . CAD Sister   . CAD Brother     Social History Social History   Tobacco Use  . Smoking status: Former Smoker    Types: Cigarettes  .  Smokeless tobacco: Never Used  Substance Use Topics  . Alcohol use: Yes    Alcohol/week: 7.0 standard drinks    Types: 7 Shots of liquor per week    Comment: pt states one drink a day  . Drug use: No    Review of Systems  Constitutional: No fever/chills Eyes: As above ENT: No sore throat. Cardiovascular: Denies chest pain. Respiratory: Denies shortness of breath. Gastrointestinal: As above Genitourinary: Negative for dysuria. Musculoskeletal: Negative for neck pain Skin: Negative for rash. Neurological: Negative for headaches or weakness   ____________________________________________   PHYSICAL EXAM:  VITAL SIGNS: ED Triage Vitals  Enc Vitals  Group     BP 09/03/18 1219 114/86     Pulse Rate 09/03/18 1219 84     Resp 09/03/18 1219 16     Temp 09/03/18 1219 97.6 F (36.4 C)     Temp Source 09/03/18 1219 Oral     SpO2 09/03/18 1219 97 %     Weight 09/03/18 1220 54.4 kg (120 lb)     Height 09/03/18 1220 1.524 m (5')     Head Circumference --      Peak Flow --      Pain Score 09/03/18 1219 0     Pain Loc --      Pain Edu? --      Excl. in [place]? --     Constitutional: Alert and oriented. No acute distress.  Eyes: OS: Pupil dilated, no erythema or steamy cornea Head: Atraumatic. Nose: No congestion/rhinnorhea. Mouth/Throat: Mucous membranes are moist.    Cardiovascular: Normal rate, regular rhythm.  Good peripheral circulation. Respiratory: Normal respiratory effort.  No retractions.  Gastrointestinal: Soft and nontender. No distention.    Musculoskeletal:   Warm and well perfused Neurologic:  Normal speech and language. No gross focal neurologic deficits are appreciated.  Skin:  Skin is warm, dry and intact. No rash noted. Psychiatric: Mood and affect are normal. Speech and behavior are normal.  ____________________________________________   LABS (all labs ordered are listed, but only abnormal results are displayed)  Labs Reviewed  CBC WITH DIFFERENTIAL/PLATELET - Abnormal; Notable for the following components:      Result Value   WBC 11.9 (*)    Hemoglobin 10.8 (*)    HCT 34.0 (*)    RDW 16.5 (*)    Platelets 500 (*)    Neutro Abs 9.0 (*)    Monocytes Absolute 1.2 (*)    Abs Immature Granulocytes 0.29 (*)    All other components within normal limits  COMPREHENSIVE METABOLIC PANEL  SEDIMENTATION RATE  C-REACTIVE PROTEIN   ____________________________________________  EKG  None ____________________________________________  RADIOLOGY  None ____________________________________________   PROCEDURES  Procedure(s) performed: No  Procedures   Critical Care performed:  No ____________________________________________   INITIAL IMPRESSION / ASSESSMENT AND PLAN / ED COURSE  Pertinent labs & imaging results that were available during my care of the patient were reviewed by me and considered in my medical decision making (see chart for details).  Patient presents with left eye vision loss, extensive evaluation ophthalmologist today, strong suspicion for giant cell arteritis.  We will give 1000 mg of Solu-Medrol order CT, ESR, CRP as directed, admit to the hospital service    ____________________________________________   FINAL CLINICAL IMPRESSION(S) / ED DIAGNOSES  Final diagnoses:  Giant cell arteritis ([place])  Vision loss of left eye        Note:  This document was prepared using Dragon voice recognition software and may include  unintentional dictation errors.    Lavonia Drafts, MD 09/03/18 [phone]

## 2018-09-03 NOTE — ED Notes (Signed)
Patient to CT.

## 2018-09-03 NOTE — ED Triage Notes (Signed)
Patient has Mary Barker cell Arteritis diagnosed by [facility] today and was sent here for stat orders. Patient reports losing L eye vision on Sunday 09/02/18. No facial droop. Speech clear. No weakness

## 2018-09-03 NOTE — H&P (Signed)
[place] [facility] [place] NAME: Mary Barker    MR#:  [id number]  DATE OF BIRTH:  06-19-1930  DATE OF ADMISSION:  09/03/2018  PRIMARY CARE PHYSICIAN: Maryland Pink, MD   REQUESTING/REFERRING PHYSICIAN: Dr Lavonia Drafts  CHIEF COMPLAINT:  Sent in for left eye vision loss with temporal arteritis  HISTORY OF PRESENT ILLNESS:  Mary Barker  is a 84 y.o. female states that she had some focusing problems on her left eye with her vision over the last few days.  Yesterday she lost her vision on the left eye.  She had a temporal artery biopsy that was negative recently.  She complains of some soreness with chewing in her jaw and some soreness on the head and some weight loss.  She complains of some hot flashes.  She went to see the ophthalmologist today and was referred into the hospital for left eye vision with temporal arteritis and recommended 3 days of IV Solu-Medrol high-dose thousand milligrams.  Hospitalist services were contacted for further evaluation.  PAST MEDICAL HISTORY:   Past Medical History:  Diagnosis Date  . Carpal tunnel syndrome   . COPD (chronic obstructive pulmonary disease) ([place])   . H/O multiple pulmonary nodules   . Hypertension   . Hyponatremia   . Hypothyroidism   . Osteoarthritis   . Thyroid disease   . Tingling in extremities     PAST SURGICAL HISTORY:   Past Surgical History:  Procedure Laterality Date  . APPENDECTOMY    . ARTERY BIOPSY Left 07/31/2018   Procedure: BIOPSY TEMPORAL ARTERY;  Surgeon: Benjamine Sprague, DO;  Location: [facility] ORS;  Service: General;  Laterality: Left;  . BREAST EXCISIONAL BIOPSY Left 1954   neg  . BREAST SURGERY    . COLONOSCOPY    . DILATION AND CURETTAGE OF UTERUS    . EYE SURGERY    . KNEE ARTHROSCOPY    . NASAL POLYP SURGERY    . TONSILLECTOMY      SOCIAL HISTORY:   Social History   Tobacco Use  . Smoking status: Former Smoker    Types: Cigarettes  . Smokeless  tobacco: Never Used  Substance Use Topics  . Alcohol use: Yes    Alcohol/week: 7.0 standard drinks    Types: 7 Shots of liquor per week    Comment: pt states one drink a day    FAMILY HISTORY:   Family History  Problem Relation Age of Onset  . CAD Father   . CAD Sister   . CAD Brother     DRUG ALLERGIES:   Allergies  Allergen Reactions  . Codeine Nausea And Vomiting    REVIEW OF SYSTEMS:  CONSTITUTIONAL: No fever.  Positive for hot flashes.  Positive for weight loss 10 pounds.  Positive for loss of appetite.  Positive for fatigue.  EYES: Wears eyeglasses.  Lost vision in the left eye yesterday EARS, NOSE, AND THROAT: No tinnitus or ear pain. No sore throat.  Positive for soreness with chewing RESPIRATORY: 2 weeks had a cough but none today.  Some shortness of breath.  No wheezing or hemoptysis.  CARDIOVASCULAR: No chest pain, orthopnea, edema.  GASTROINTESTINAL: No nausea, vomiting, diarrhea or abdominal pain. No blood in bowel movements GENITOURINARY: No dysuria, hematuria.  ENDOCRINE: No polyuria, nocturia.  Positive for hypothyroidism HEMATOLOGY: No anemia, easy bruising or bleeding SKIN: No rash or lesion. MUSCULOSKELETAL: No joint pain or arthritis.   NEUROLOGIC: No tingling, numbness, weakness.  PSYCHIATRY: No  anxiety or depression.   MEDICATIONS AT HOME:   Prior to Admission medications   Medication Sig Start Date End Date Taking? Authorizing Provider  aspirin 81 MG tablet Take 81 mg by mouth every evening.    Yes [provider]  atenolol (TENORMIN) 25 MG tablet Take 25 mg by mouth every evening.    Yes [provider]  Calcium Carbonate-Vitamin D (SM CALCIUM 500/VITAMIN D3 PO) Take 1 tablet by mouth 2 (two) times daily.   Yes [provider]  levothyroxine (SYNTHROID, LEVOTHROID) 125 MCG tablet Take 125 mcg by mouth daily before breakfast.    Yes [provider]  losartan (COZAAR) 50 MG tablet Take 50 mg by mouth daily.   Yes  [provider]  montelukast (SINGULAIR) 10 MG tablet Take 10 mg by mouth at bedtime.   Yes [provider]  Multiple Vitamins-Minerals (CENTRUM SILVER PO) Take 1 tablet by mouth every evening.    Yes [provider]  Multiple Vitamins-Minerals (PRESERVISION AREDS 2 PO) Take 1 capsule by mouth 2 (two) times daily.   Yes [provider]  spironolactone (ALDACTONE) 25 MG tablet Take 25 mg by mouth daily.   Yes [provider]      VITAL SIGNS:  Blood pressure 114/86, pulse 84, temperature 97.6 F (36.4 C), temperature source Oral, resp. rate 16, height 5' (1.524 m), weight 54.4 kg, SpO2 97 %.  PHYSICAL EXAMINATION:  GENERAL:  84 y.o.-year-old patient lying in the bed with no acute distress.  EYES: Left eye dilated from ophthalmology exam.  Extraocular muscles intact.  Patient unable to see at 20/800 left eye HEENT: Head atraumatic, normocephalic. Oropharynx and nasopharynx clear.  NECK:  Supple, no jugular venous distention. No thyroid enlargement, no tenderness.  LUNGS: Normal breath sounds bilaterally, no wheezing, rales,rhonchi or crepitation. No use of accessory muscles of respiration.  CARDIOVASCULAR: S1, S2 normal. No murmurs, rubs, or gallops.  ABDOMEN: Soft, nontender, nondistended. Bowel sounds present. No organomegaly or mass.  EXTREMITIES: No pedal edema, cyanosis, or clubbing.  NEUROLOGIC:  Muscle strength 5/5 in all extremities. Sensation intact. Gait not checked.  PSYCHIATRIC: The patient is alert and oriented x 3.  SKIN: No rash, lesion, or ulcer.   LABORATORY PANEL:   CBC Recent Labs  Lab 09/03/18 1232  WBC 11.9*  HGB 10.8*  HCT 34.0*  PLT 500*   ------------------------------------------------------------------------------------------------------------------  Chemistries  Recent Labs  Lab 09/03/18 1232  NA 135  K 4.2  CL 100  CO2 24  GLUCOSE 118*  BUN 11  CREATININE 0.71  CALCIUM 9.2  AST 18  ALT 18   ALKPHOS 70  BILITOT 0.6   ------------------------------------------------------------------------------------------------------------------    RADIOLOGY:  Ct Angio Head W Or Wo Contrast  Result Date: 09/03/2018 CLINICAL DATA:  84 year old female with giant cell arteritis diagnosed by [facility] today. Recent loss of left eye vision. EXAM: CT ANGIOGRAPHY HEAD TECHNIQUE: Multidetector CT imaging of the head was performed using the standard protocol during bolus administration of intravenous contrast. Multiplanar CT image reconstructions and MIPs were obtained to evaluate the vascular anatomy. CONTRAST:  42mL OMNIPAQUE IOHEXOL 350 MG/ML SOLN COMPARISON:  Head and cervical spine CT 11/03/2016. Brain MRI 09/27/2013. FINDINGS: CT HEAD Brain: Cerebral volume is stable since 2018. No midline shift, mass effect, or evidence of intracranial mass lesion. No ventriculomegaly. No acute intracranial hemorrhage identified. Gray-white matter differentiation remains within normal limits for age; minimal white matter hypodensity. No cortically based acute infarct identified. No cortical encephalomalacia identified.  Calvarium and skull base: Stable, negative. Paranasal sinuses: Visualized paranasal sinuses and mastoids are stable and well pneumatized. Orbits: Visualized scalp soft tissues are within normal limits. Normal CT appearance of the visible orbits soft tissues. CTA HEAD Posterior circulation: Codominant distal vertebral arteries are patent without stenosis. Normal vertebrobasilar junction. Both PICA origins appear patent. Patent basilar artery with mild tortuosity. No basilar stenosis. Normal SCA and right PCA origins. Right posterior communicating artery is diminutive or absent. Left posterior communicating artery is present with a tortuous left P1 segment which arises from the basilar tip and is directed anteriorly (series 15, image 80). Mild bilateral PCA tortuosity, greater on the left. PCA branches  are otherwise within normal limits. Anterior circulation: Patent ICA siphons with moderate bilateral calcified plaque. Mild bilateral proximal cavernous segment stenosis only, no hemodynamically significant siphon stenosis. Both ophthalmic artery origins appear normal. Both proximal ophthalmic arteries are patent. No ophthalmic artery abnormality is evident. Normal left posterior communicating artery origin. Patent carotid termini. Normal MCA and ACA origins. Anterior communicating artery is normal. Bilateral ACA branches are normal aside from tortuosity. Left MCA M1 segment, bifurcation, and left MCA branches are within normal limits. Right MCA M1 segment is tortuous with a kinked appearance (series 14, image 18). Right MCA bifurcation and branches are within normal limits. Venous sinuses: Patent on the delayed images. Anatomic variants: None. Delayed phase: No abnormal enhancement identified. Review of the MIP images confirms the above findings IMPRESSION: 1. Negative for large vessel occlusion or hemodynamically significant intracranial artery stenosis. 2. Both proximal ophthalmic arteries are patent and appear unremarkable. Moderate bilateral ICA siphon calcified plaque but only mild siphon stenosis. 3. Intracranial artery tortuosity, most pronounced in the left P1 and the right M1 segments. 4. Negative for age CT appearance of the brain. Electronically Signed   By: Genevie Ann M.D.   On: 09/03/2018 13:44    IMPRESSION AND PLAN:   1.  Left eye vision loss and temporal arteritis.  Start high-dose Solu-Medrol thousand milligrams IV daily for 3 days.  Patient will need 2 more doses and then be discharged on prednisone 80 mg daily and follow-up with Dr. Precious Reel as outpatient.  Benefits and risks of steroids explained to the patient. 2.  Hypertension continue her usual antihypertensive medications.  Blood pressure stable. 3.  Hypothyroidism unspecified on levothyroxine 4.  COPD.  Respiratory status  stable  All the records are reviewed and case discussed with ED provider. Management plans discussed with the patient, and she is in agreement.  CODE STATUS: full code  TOTAL TIME TAKING CARE OF THIS PATIENT: 50 minutes.    Loletha Grayer M.D on 09/03/2018 at 2:25 PM  Between 7am to 6pm - Pager - [phone]  After 6pm call admission pager [phone]  [facility]  [phone]  CC: Primary care physician; [place] Pink, MD

## 2018-09-03 NOTE — Consult Note (Signed)
Reason for Consult: Temporal arteritis  Referring Physician: Hospitalist  Mary Barker   HPI: 84 year old white female.  For the last 2 months she has had nonspecific symptoms.  Weakness.  Some sinus.  Some GI upset.  Was found to have an elevated sed rate.  Was evaluated.  At the time had no significant jaw claudication.  Did have some right jaw pain for which she saw dentist and used an apparatus.  She had had no visual symptoms or no significant headache.  Did lose weight.  She had mild proteinuria elevated sed rate and mild leukocytosis and thrombocytosis.  She underwent a temporary biopsy on the left which was negative.  She was transiently on steroid which was poorly tolerated.  Resolve hematology for leukocytosis with no definite etiology found.  2 days ago she had decreased focusing in the left.  No headache.  Yesterday she felt like she had a black area of that she could not see.  She has had prior macular degeneration.  She was seen by ophthalmology today with near complete visual loss in the left.  Thought to be ischemic retinopathy. Sed rate repeat was in the 80s.  Leukocytosis.  Thrombocytosis and anemia.  He was felt to have temporal arteritis.  Admitted for further work-up and for IV steroid.  PMH: Sinusitis Hypothyroid Hypertension Carpal tunnel  SURGICAL HISTORY: Appendectomy.  Cataract.  Nasal polyps.  Tonsillectomy  Family History: Negative for connective tissue diseases  Social History:Lives alone.  Prior cigarettes.  No significant alcohol   Allergies:  Allergies  Allergen Reactions  . Codeine Nausea And Vomiting    Medications:  Scheduled: . aspirin  81 mg Oral QPM  . atenolol  25 mg Oral QPM  . calcium-vitamin D  1 tablet Oral BID  . CENTRUM SILVER  1 tablet Oral QPM  . enoxaparin (LOVENOX) injection  40 mg Subcutaneous Q24H  . [START ON 09/04/2018] levothyroxine  125 mcg Oral QAC breakfast  . [START ON 09/04/2018] losartan  50 mg Oral Daily  .  montelukast  10 mg Oral QHS  . PRESERVISION AREDS 2  1 tablet Oral BID  . [START ON 09/04/2018] spironolactone  25 mg Oral Daily        ROS: Some nausea and vomiting about 2 months ago but none since.  Decreased appetite.   PHYSICAL EXAM: Blood pressure 114/86, pulse 84, temperature 97.6 F (36.4 C), temperature source Oral, resp. rate 16, height 5' (1.524 m), weight 54.4 kg, SpO2 97 %. Pleasant female.  No acute distress.  Sclera clear.  Dilated left pupil nonreactive.  No finger reading from the left eye.  Can read the fingers from the right.  Nontender temporal arteries.  Good carotid upstroke.  No bruits.  Clear chest.  No significant murmur.  No abdominal bruits.  Good femoral distal pulses.  No visceromegaly.  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  1/2 to 1 g IV daily for 3 days Followed by 1 mg/kg  Prednisone  Consider vitamin D level and vitamin D supplementation given steroid use Consider PPI given steroid use May need OT evaluation to see if she is going to be safe at home with new visual loss I will follow-up in the office 1 week after discharge  Emmaline Kluver 09/03/2018, 2:51 PM

## 2018-09-03 NOTE — ED Triage Notes (Signed)
First Nurse Note:  Arrives from [facility] for ED evaluation of possible Giant Cell Arteritis of the left eye.

## 2018-09-03 NOTE — ED Notes (Signed)
Dr Earleen Newport in to see patient

## 2018-09-04 LAB — CBC
HEMATOCRIT: 31.4 % — AB (ref 36.0–46.0)
Hemoglobin: 10.4 g/dL — ABNORMAL LOW (ref 12.0–15.0)
MCH: 28.3 pg (ref 26.0–34.0)
MCHC: 33.1 g/dL (ref 30.0–36.0)
MCV: 85.3 fL (ref 80.0–100.0)
Platelets: 471 10*3/uL — ABNORMAL HIGH (ref 150–400)
RBC: 3.68 MIL/uL — ABNORMAL LOW (ref 3.87–5.11)
RDW: 16 % — ABNORMAL HIGH (ref 11.5–15.5)
WBC: 10.4 10*3/uL (ref 4.0–10.5)
nRBC: 0 % (ref 0.0–0.2)

## 2018-09-04 LAB — BASIC METABOLIC PANEL
Anion gap: 11 (ref 5–15)
BUN: 17 mg/dL (ref 8–23)
CO2: 23 mmol/L (ref 22–32)
Calcium: 9.6 mg/dL (ref 8.9–10.3)
Chloride: 99 mmol/L (ref 98–111)
Creatinine, Ser: 0.72 mg/dL (ref 0.44–1.00)
GFR calc Af Amer: >60 mL/min (ref >60)
GFR calc non Af Amer: >60 mL/min (ref >60)
Glucose, Bld: 145 mg/dL — ABNORMAL HIGH (ref 70–99)
Potassium: 4.2 mmol/L (ref 3.5–5.1)
Sodium: 133 mmol/L — ABNORMAL LOW (ref 135–145)

## 2018-09-04 MED ORDER — ENSURE ENLIVE PO LIQD
237.0000 mL | Freq: Two times a day (BID) | ORAL | Status: DC
  Administered 2018-09-05: 237 mL via ORAL

## 2018-09-04 MED ORDER — DIPHENHYDRAMINE HCL 25 MG PO CAPS
25.0000 mg | ORAL_CAPSULE | Freq: Every evening | ORAL | Status: DC | PRN
  Administered 2018-09-04: 03:00:00 25 mg via ORAL
  Filled 2018-09-04: qty 1

## 2018-09-04 NOTE — Progress Notes (Signed)
Initial Nutrition Assessment  DOCUMENTATION CODES:   Not applicable  INTERVENTION:   Ensure Enlive po BID, each supplement provides 350 kcal and 20 grams of protein  MVI daily  NUTRITION DIAGNOSIS:   Inadequate oral intake related to acute illness as evidenced by per patient/family report.  GOAL:   Patient will meet greater than or equal to 90% of their needs  MONITOR:   PO intake, Supplement acceptance, Labs, Weight trends, Skin, I & O's  REASON FOR ASSESSMENT:   Malnutrition Screening Tool    ASSESSMENT:   84 y.o. female sent to the emergency department by her ophthalmologist for presumed giant cell arteritis.   Met with pt in room today. Pt reports good appetite but poor oral intake pta r/t jaw pain. Pt reports intermittent jaw pain making it difficult for her to chew. Pt has been drinking Ensure and taking a multivitamin daily at home. Per chart, pt with 9lb(7%) weight loss over the past 6 months; this is not significant. Pt reports she weighs herself daily and is usually 118lbs. Pt eating only sips and bites of meals in hospital. RD will order supplements to help pt meet her estimated needs.    Medications reviewed and include: aspirin, oscal w/ D, lovenox, synthroid, MVI, ocuvite, aldactone, solu-medrol   Labs reviewed: Na 133(L) Hgb 10.4(L), Hct 31.4(L)  NUTRITION - FOCUSED PHYSICAL EXAM:    Most Recent Value  Orbital Region  No depletion  Upper Arm Region  Mild depletion  Thoracic and Lumbar Region  Mild depletion  Buccal Region  No depletion  Temple Region  No depletion  Clavicle Bone Region  Mild depletion  Clavicle and Acromion Bone Region  Mild depletion  Scapular Bone Region  No depletion  Dorsal Hand  No depletion  Patellar Region  Mild depletion  Anterior Thigh Region  Mild depletion  Posterior Calf Region  Mild depletion  Edema (RD Assessment)  None  Hair  Reviewed  Eyes  Reviewed  Mouth  Reviewed  Skin  Reviewed  Nails  Reviewed     Diet  Order:   Diet Order            Diet regular Room service appropriate? Yes; Fluid consistency: Thin  Diet effective now             EDUCATION NEEDS:   Education needs have been addressed  Skin:  Skin Assessment: Reviewed RN Assessment  Last BM:  12/29  Height:   Ht Readings from Last 1 Encounters:  09/03/18 5' (1.524 m)    Weight:   Wt Readings from Last 1 Encounters:  09/03/18 54.4 kg    Ideal Body Weight:  45.45 kg  BMI:  Body mass index is 23.44 kg/m.  Estimated Nutritional Needs:   Kcal:  1200-1400kcal/day   Protein:  55-60g/day   Fluid:  1.1L/day   Koleen Distance MS, RD, LDN Pager #- [phone] Office#- [phone] After Hours Pager: [phone]

## 2018-09-04 NOTE — Progress Notes (Signed)
[facility] [place] NAME: Mary Barker    MR#:  [id number]  DATE OF BIRTH:  [birth year]  SUBJECTIVE:   Patient admitted to the hospital secondary to progressive left eye vision loss and suspected to have giant cell arteritis.  Seen by rheumatology and started on high-dose IV steroids.   Patient complains of some insomnia with the high-dose steroids.  REVIEW OF SYSTEMS:    Review of Systems  Constitutional: Negative for chills and fever.  HENT: Negative for congestion and tinnitus.   Eyes: Negative for blurred vision and double vision.  Respiratory: Negative for cough, shortness of breath and wheezing.   Cardiovascular: Negative for chest pain, orthopnea and PND.  Gastrointestinal: Negative for abdominal pain, diarrhea, nausea and vomiting.  Genitourinary: Negative for dysuria and hematuria.  Neurological: Negative for dizziness, sensory change and focal weakness.  All other systems reviewed and are negative.   Nutrition: Regular Tolerating Diet: Yes Tolerating PT: Ambulatory  DRUG ALLERGIES:   Allergies  Allergen Reactions  . Codeine Nausea And Vomiting    VITALS:  Blood pressure 134/69, pulse 84, temperature (!) 97.5 F (36.4 C), temperature source Oral, resp. rate 18, height 5' (1.524 m), weight 54.4 kg, SpO2 94 %.  PHYSICAL EXAMINATION:   Physical Exam  GENERAL:  84 y.o.-year-old patient lying in bed in no acute distress.  EYES: Pupils equal, round, reactive to light and accommodation. No scleral icterus. Extraocular muscles intact.  HEENT: Head atraumatic, normocephalic. Oropharynx and nasopharynx clear.  NECK:  Supple, no jugular venous distention. No thyroid enlargement, no tenderness.  LUNGS: Normal breath sounds bilaterally, no wheezing, rales, rhonchi. No use of accessory muscles of respiration.  CARDIOVASCULAR: S1, S2 normal. No murmurs, rubs, or gallops.  ABDOMEN: Soft, nontender, nondistended. Bowel sounds  present. No organomegaly or mass.  EXTREMITIES: No cyanosis, clubbing or edema b/l.    NEUROLOGIC: Cranial nerves II through XII are intact. No focal Motor or sensory deficits b/l.   PSYCHIATRIC: The patient is alert and oriented x 3.  SKIN: No obvious rash, lesion, or ulcer.    LABORATORY PANEL:   CBC Recent Labs  Lab 09/04/18 0421  WBC 10.4  HGB 10.4*  HCT 31.4*  PLT 471*   ------------------------------------------------------------------------------------------------------------------  Chemistries  Recent Labs  Lab 09/03/18 1232 09/04/18 0421  NA 135 133*  K 4.2 4.2  CL 100 99  CO2 24 23  GLUCOSE 118* 145*  BUN 11 17  CREATININE 0.71 0.72  CALCIUM 9.2 9.6  AST 18  --   ALT 18  --   ALKPHOS 70  --   BILITOT 0.6  --    ------------------------------------------------------------------------------------------------------------------  Cardiac Enzymes No results for input(s): TROPONINI in the last 168 hours. ------------------------------------------------------------------------------------------------------------------  RADIOLOGY:  Ct Angio Head W Or Wo Contrast  Result Date: 09/03/2018 CLINICAL DATA:  84 year old female with giant cell arteritis diagnosed by [facility] today. Recent loss of left eye vision. EXAM: CT ANGIOGRAPHY HEAD TECHNIQUE: Multidetector CT imaging of the head was performed using the standard protocol during bolus administration of intravenous contrast. Multiplanar CT image reconstructions and MIPs were obtained to evaluate the vascular anatomy. CONTRAST:  28mL OMNIPAQUE IOHEXOL 350 MG/ML SOLN COMPARISON:  Head and cervical spine CT 11/03/2016. Brain MRI 09/27/2013. FINDINGS: CT HEAD Brain: Cerebral volume is stable since 2018. No midline shift, mass effect, or evidence of intracranial mass lesion. No ventriculomegaly. No acute intracranial hemorrhage identified. Gray-white matter differentiation remains within normal limits  for age;  minimal white matter hypodensity. No cortically based acute infarct identified. No cortical encephalomalacia identified. Calvarium and skull base: Stable, negative. Paranasal sinuses: Visualized paranasal sinuses and mastoids are stable and well pneumatized. Orbits: Visualized scalp soft tissues are within normal limits. Normal CT appearance of the visible orbits soft tissues. CTA HEAD Posterior circulation: Codominant distal vertebral arteries are patent without stenosis. Normal vertebrobasilar junction. Both PICA origins appear patent. Patent basilar artery with mild tortuosity. No basilar stenosis. Normal SCA and right PCA origins. Right posterior communicating artery is diminutive or absent. Left posterior communicating artery is present with a tortuous left P1 segment which arises from the basilar tip and is directed anteriorly (series 15, image 80). Mild bilateral PCA tortuosity, greater on the left. PCA branches are otherwise within normal limits. Anterior circulation: Patent ICA siphons with moderate bilateral calcified plaque. Mild bilateral proximal cavernous segment stenosis only, no hemodynamically significant siphon stenosis. Both ophthalmic artery origins appear normal. Both proximal ophthalmic arteries are patent. No ophthalmic artery abnormality is evident. Normal left posterior communicating artery origin. Patent carotid termini. Normal MCA and ACA origins. Anterior communicating artery is normal. Bilateral ACA branches are normal aside from tortuosity. Left MCA M1 segment, bifurcation, and left MCA branches are within normal limits. Right MCA M1 segment is tortuous with a kinked appearance (series 14, image 18). Right MCA bifurcation and branches are within normal limits. Venous sinuses: Patent on the delayed images. Anatomic variants: None. Delayed phase: No abnormal enhancement identified. Review of the MIP images confirms the above findings IMPRESSION: 1. Negative for large vessel occlusion or  hemodynamically significant intracranial artery stenosis. 2. Both proximal ophthalmic arteries are patent and appear unremarkable. Moderate bilateral ICA siphon calcified plaque but only mild siphon stenosis. 3. Intracranial artery tortuosity, most pronounced in the left P1 and the right M1 segments. 4. Negative for age CT appearance of the brain. Electronically Signed   By: Genevie Ann M.D.   On: 09/03/2018 13:44     ASSESSMENT AND PLAN:   84 year old female with past medical history of COPD, carpal tunnel syndrome, hypothyroidism, osteoarthritis, essential hypertension admitted to the hospital secondary to progressive left eye vision loss and suspected to have giant cell arteritis.  1.  Suspected giant cell/temporal arteritis- patient presented to the hospital secondary to progressive left eye vision loss.  She had a outpatient temporal artery biopsy which was not diagnostic for giant cell arteritis.  Patient's sed rate/CRP were elevated and she had progressive symptoms and therefore was admitted to the hospital. -Seen by rheumatology and continue high-dose IV steroids for additional 2 days and can be switched over to oral prednisone upon discharge tomorrow with outpatient follow-up with them.  2.  Hypothyroidism-continue Synthroid.  3.  Essential hypertension-continue atenolol, losartan, Aldactone.  4.  Insomnia-secondary to the high-dose steroids.  Continue melatonin.     All the records are reviewed and case discussed with Care Management/Social Worker. Management plans discussed with the patient, family and they are in agreement.  CODE STATUS: Full code  DVT Prophylaxis: Lovenox  TOTAL TIME TAKING CARE OF THIS PATIENT: 30 minutes.   POSSIBLE D/C IN 1-2 DAYS, DEPENDING ON CLINICAL CONDITION.   Henreitta Leber M.D on 09/04/2018 at 3:40 PM  Between 7am to 6pm - Pager - [phone]  After 6pm go to [email or web address] - Proofreader  [facility]    Office  [phone]  CC: Primary care physician; [place] Pink, MD

## 2018-09-05 MED ORDER — PREDNISONE 20 MG PO TABS: 60.0000 mg | ORAL_TABLET | Freq: Every day | ORAL | Status: DC

## 2018-09-05 NOTE — Clinical Social Work Note (Signed)
Clinical Social Work Assessment  Patient Details  Name: Mary Barker MRN: [id number] Date of Birth: [birth year]  Date of referral:  09/05/18               Reason for consult:  Discharge Planning                Permission sought to share information with:    Permission granted to share information::     Name::        Agency::     Relationship::     Contact Information:     Housing/Transportation Living arrangements for the past 2 months:  [place] of Information:  Patient Patient Interpreter Needed:  None Criminal Activity/Legal Involvement Pertinent to Current Situation/Hospitalization:  No - Comment as needed Significant Relationships:  Adult Children Lives with:  Self Do you feel safe going back to the place where you live?  Yes Need for family participation in patient care:  Yes (Comment)  Care giving concerns:  Patient lives at the [place] independent living at [facility].    Social Worker assessment / plan:  Holiday representative (CSW) reviewed chart and noted that patient is from [employer]. CSW met with patient alone at bedside today to confirm her level of care at home. Patient was alert and oriented X4 and was sitting up in the bed fully dressed. CSW introduced self and explained role of CSW department. Per patient she lives alone at the independent living section called the [place] at [facility]. Patient reported that she has very supportive neighbors and feels comfortable going home today. Per patient her son Ronalee Belts lives in [place] and is coming to pick her up today from [facility]. Patient reported no needs or concerns. Please reconsult if future social work needs arise. CSW signing off.   Employment status:  Retired Nurse, adult PT Recommendations:  Not assessed at this time Information / Referral to community resources:  Other (Comment Required)(No needs )  Patient/Family's Response to care:  Patient is agreeable to  D/C home today.   Patient/Family's Understanding of and Emotional Response to Diagnosis, Current Treatment, and Prognosis:  Patient was very pleasant and thanked CSW for visit.   Emotional Assessment Appearance:  Appears stated age Attitude/Demeanor/Rapport:    Affect (typically observed):  Accepting, Adaptable, Pleasant Orientation:  Oriented to Self, Oriented to Place, Oriented to  Time, Oriented to Situation Alcohol / Substance use:  Not Applicable Psych involvement (Current and /or in the community):  No (Comment)  Discharge Needs  Concerns to be addressed:  No discharge needs identified Readmission within the last 30 days:  No Current discharge risk:  None Barriers to Discharge:  No Barriers Identified   Liannah Yarbough, Veronia Beets, LCSW 09/05/2018, 1:49 PM

## 2018-09-05 NOTE — Discharge Summary (Signed)
[facility] [place] NAME: Mary Barker    MR#:  [id number]  DATE OF BIRTH:  1930/03/28  DATE OF ADMISSION:  09/03/2018 ADMITTING PHYSICIAN: Loletha Grayer, MD  DATE OF DISCHARGE: 09/05/2018 12:54 PM  PRIMARY CARE PHYSICIAN: [place] Pink, MD    ADMISSION DIAGNOSIS:  Giant cell arteritis ([place]) [M31.6] Vision loss of left eye [H54.62]  DISCHARGE DIAGNOSIS:  Active Problems:   Temporal arteritis ([place])   SECONDARY DIAGNOSIS:   Past Medical History:  Diagnosis Date  . Carpal tunnel syndrome   . COPD (chronic obstructive pulmonary disease) ([place])   . H/O multiple pulmonary nodules   . Hypertension   . Hyponatremia   . Hypothyroidism   . Osteoarthritis   . Thyroid disease   . Tingling in extremities     HOSPITAL COURSE:   85 year old female with past medical history of COPD, carpal tunnel syndrome, hypothyroidism, osteoarthritis, essential hypertension admitted to the hospital secondary to progressive left eye vision loss and suspected to have giant cell arteritis.  1.  Suspected giant cell/temporal arteritis- patient presented to the hospital secondary to progressive left eye vision loss.  She had a outpatient temporal artery biopsy which was not diagnostic for giant cell arteritis.  Patient's sed rate/CRP were elevated and she had progressive symptoms and therefore was admitted to the hospital. -Seen by rheumatology and given IV steroids for 2 days and now being discharged on Oral High dose Prednisone and outpatient follow up with Rheumatology.   2.  Hypothyroidism- pt. Will continue Synthroid.  3.  Essential hypertension-pt. Will continue atenolol, losartan, Aldactone.  4.  Insomnia-secondary to the high-dose steroids.  Continue melatonin.   DISCHARGE CONDITIONS:   Stable.   CONSULTS OBTAINED:    DRUG ALLERGIES:   Allergies  Allergen Reactions  . Codeine Nausea And Vomiting    DISCHARGE MEDICATIONS:    Allergies as of 09/05/2018      Reactions   Codeine Nausea And Vomiting      Medication List    TAKE these medications   aspirin 81 MG tablet Take 81 mg by mouth every evening.   atenolol 25 MG tablet Commonly known as:  TENORMIN Take 25 mg by mouth every evening.   CENTRUM SILVER PO Take 1 tablet by mouth every evening.   PRESERVISION AREDS 2 PO Take 1 capsule by mouth 2 (two) times daily.   levothyroxine 125 MCG tablet Commonly known as:  SYNTHROID, LEVOTHROID Take 125 mcg by mouth daily before breakfast.   losartan 50 MG tablet Commonly known as:  COZAAR Take 50 mg by mouth daily.   montelukast 10 MG tablet Commonly known as:  SINGULAIR Take 10 mg by mouth at bedtime.   predniSONE 20 MG tablet Commonly known as:  DELTASONE Take 3 tablets (60 mg total) by mouth daily with breakfast.   SM CALCIUM 500/VITAMIN D3 PO Take 1 tablet by mouth 2 (two) times daily.   spironolactone 25 MG tablet Commonly known as:  ALDACTONE Take 25 mg by mouth daily.         DISCHARGE INSTRUCTIONS:   DIET:  Cardiac diet  DISCHARGE CONDITION:  Stable  ACTIVITY:  Activity as tolerated  OXYGEN:  Home Oxygen: No.   Oxygen Delivery: room air  DISCHARGE LOCATION:  home   If you experience worsening of your admission symptoms, develop shortness of breath, life threatening emergency, suicidal or homicidal thoughts you must seek medical attention immediately by calling 911 or calling your MD immediately  if symptoms less severe.  You Must read complete instructions/literature along with all the possible adverse reactions/side effects for all the Medicines you take and that have been prescribed to you. Take any new Medicines after you have completely understood and accpet all the possible adverse reactions/side effects.   Please note  You were cared for by a hospitalist during your hospital stay. If you have any questions about your discharge medications or the care you  received while you were in the hospital after you are discharged, you can call the unit and asked to speak with the hospitalist on call if the hospitalist that took care of you is not available. Once you are discharged, your primary care physician will handle any further medical issues. Please note that NO REFILLS for any discharge medications will be authorized once you are discharged, as it is imperative that you return to your primary care physician (or establish a relationship with a primary care physician if you do not have one) for your aftercare needs so that they can reassess your need for medications and monitor your lab values.     Today   No acute events overnight.  No complaints. Will d/c home today.    VITAL SIGNS:  Blood pressure 129/63, pulse 72, temperature 98.6 F (37 C), resp. rate 17, height 5' (1.524 m), weight 54.4 kg, SpO2 95 %.  I/O:    Intake/Output Summary (Last 24 hours) at 09/05/2018 1614 Last data filed at 09/05/2018 0900 Gross per 24 hour  Intake 480 ml  Output -  Net 480 ml    PHYSICAL EXAMINATION:  GENERAL:  85 y.o.-year-old patient lying in the bed with no acute distress.  EYES: Pupils equal, round, reactive to light and accommodation. No scleral icterus. Extraocular muscles intact.  HEENT: Head atraumatic, normocephalic. Oropharynx and nasopharynx clear.  NECK:  Supple, no jugular venous distention. No thyroid enlargement, no tenderness.  LUNGS: Normal breath sounds bilaterally, no wheezing, rales,rhonchi. No use of accessory muscles of respiration.  CARDIOVASCULAR: S1, S2 normal. No murmurs, rubs, or gallops.  ABDOMEN: Soft, non-tender, non-distended. Bowel sounds present. No organomegaly or mass.  EXTREMITIES: No pedal edema, cyanosis, or clubbing.  NEUROLOGIC: Cranial nerves II through XII are intact. No focal motor or sensory defecits b/l.  PSYCHIATRIC: The patient is alert and oriented x 3.  SKIN: No obvious rash, lesion, or ulcer.   DATA REVIEW:    CBC Recent Labs  Lab 09/04/18 0421  WBC 10.4  HGB 10.4*  HCT 31.4*  PLT 471*    Chemistries  Recent Labs  Lab 09/03/18 1232 09/04/18 0421  NA 135 133*  K 4.2 4.2  CL 100 99  CO2 24 23  GLUCOSE 118* 145*  BUN 11 17  CREATININE 0.71 0.72  CALCIUM 9.2 9.6  AST 18  --   ALT 18  --   ALKPHOS 70  --   BILITOT 0.6  --     Cardiac Enzymes No results for input(s): TROPONINI in the last 168 hours.  Microbiology Results  Results for orders placed or performed during the hospital encounter of 12/04/16  Blood culture (routine x 2)     Status: None   Collection Time: 12/04/16  6:44 PM  Result Value Ref Range Status   Specimen Description BLOOD RIGHT ANTECUBITAL  Final   Special Requests   Final    BOTTLES DRAWN AEROBIC AND ANAEROBIC Blood Culture results may not be optimal due to an excessive volume of blood received in culture  bottles   Culture NO GROWTH 5 DAYS  Final   Report Status 12/09/2016 FINAL  Final  Blood culture (routine x 2)     Status: None   Collection Time: 12/04/16  6:44 PM  Result Value Ref Range Status   Specimen Description BLOOD RIGHT FOREARM  Final   Special Requests   Final    BOTTLES DRAWN AEROBIC AND ANAEROBIC Blood Culture results may not be optimal due to an excessive volume of blood received in culture bottles   Culture NO GROWTH 5 DAYS  Final   Report Status 12/09/2016 FINAL  Final  MRSA PCR Screening     Status: None   Collection Time: 12/04/16  9:23 PM  Result Value Ref Range Status   MRSA by PCR NEGATIVE NEGATIVE Final    Comment:        The GeneXpert MRSA Assay (FDA approved for NASAL specimens only), is one component of a comprehensive MRSA colonization surveillance program. It is not intended to diagnose MRSA infection nor to guide or monitor treatment for MRSA infections.     RADIOLOGY:  No results found.    Management plans discussed with the patient, family and they are in agreement.  CODE STATUS:  Code Status  History    Date Active Date Inactive Code Status Order ID Comments User Context   09/03/2018 1422 09/05/2018 1600 Full Code [id number]  Loletha Grayer, MD ED    TOTAL TIME TAKING CARE OF THIS PATIENT: 40 minutes.    Henreitta Leber M.D on 09/05/2018 at 4:14 PM  Between 7am to 6pm - Pager - [phone]  After 6pm go to [email or web address] - Proofreader  [facility]  Office  [phone]  CC: Primary care physician; [place] Pink, MD

## 2018-09-06 DIAGNOSIS — F19982 Other psychoactive substance use, unspecified with psychoactive substance-induced sleep disorder: Secondary | ICD-10-CM | POA: Diagnosis not present

## 2018-09-06 DIAGNOSIS — M316 Other giant cell arteritis: Secondary | ICD-10-CM | POA: Diagnosis not present

## 2018-09-06 DIAGNOSIS — M810 Age-related osteoporosis without current pathological fracture: Secondary | ICD-10-CM | POA: Diagnosis not present

## 2018-09-07 DIAGNOSIS — H353211 Exudative age-related macular degeneration, right eye, with active choroidal neovascularization: Secondary | ICD-10-CM | POA: Diagnosis not present

## 2018-09-13 DIAGNOSIS — R899 Unspecified abnormal finding in specimens from other organs, systems and tissues: Secondary | ICD-10-CM | POA: Diagnosis not present

## 2018-09-13 DIAGNOSIS — M316 Other giant cell arteritis: Secondary | ICD-10-CM | POA: Diagnosis not present

## 2018-09-13 DIAGNOSIS — Z79899 Other long term (current) drug therapy: Secondary | ICD-10-CM | POA: Diagnosis not present

## 2018-09-13 DIAGNOSIS — M810 Age-related osteoporosis without current pathological fracture: Secondary | ICD-10-CM | POA: Diagnosis not present

## 2018-09-13 DIAGNOSIS — J9811 Atelectasis: Secondary | ICD-10-CM | POA: Diagnosis not present

## 2018-09-17 DIAGNOSIS — R739 Hyperglycemia, unspecified: Secondary | ICD-10-CM | POA: Diagnosis not present

## 2018-09-17 DIAGNOSIS — K219 Gastro-esophageal reflux disease without esophagitis: Secondary | ICD-10-CM | POA: Diagnosis not present

## 2018-09-17 DIAGNOSIS — D72829 Elevated white blood cell count, unspecified: Secondary | ICD-10-CM | POA: Diagnosis not present

## 2018-09-19 DIAGNOSIS — D72829 Elevated white blood cell count, unspecified: Secondary | ICD-10-CM | POA: Diagnosis not present

## 2018-09-19 DIAGNOSIS — R739 Hyperglycemia, unspecified: Secondary | ICD-10-CM | POA: Diagnosis not present

## 2018-09-20 DIAGNOSIS — M810 Age-related osteoporosis without current pathological fracture: Secondary | ICD-10-CM | POA: Diagnosis not present

## 2018-09-20 DIAGNOSIS — F19982 Other psychoactive substance use, unspecified with psychoactive substance-induced sleep disorder: Secondary | ICD-10-CM | POA: Diagnosis not present

## 2018-09-20 DIAGNOSIS — M316 Other giant cell arteritis: Secondary | ICD-10-CM | POA: Diagnosis not present

## 2018-09-25 DIAGNOSIS — I10 Essential (primary) hypertension: Secondary | ICD-10-CM | POA: Diagnosis not present

## 2018-09-25 DIAGNOSIS — R42 Dizziness and giddiness: Secondary | ICD-10-CM | POA: Diagnosis not present

## 2018-09-26 DIAGNOSIS — R0602 Shortness of breath: Secondary | ICD-10-CM | POA: Diagnosis not present

## 2018-09-26 DIAGNOSIS — I10 Essential (primary) hypertension: Secondary | ICD-10-CM | POA: Diagnosis not present

## 2018-10-01 DIAGNOSIS — M316 Other giant cell arteritis: Secondary | ICD-10-CM | POA: Diagnosis not present

## 2018-10-04 DIAGNOSIS — M6281 Muscle weakness (generalized): Secondary | ICD-10-CM | POA: Diagnosis not present

## 2018-10-04 DIAGNOSIS — M316 Other giant cell arteritis: Secondary | ICD-10-CM | POA: Diagnosis not present

## 2018-10-04 DIAGNOSIS — R739 Hyperglycemia, unspecified: Secondary | ICD-10-CM | POA: Diagnosis not present

## 2018-10-11 DIAGNOSIS — M316 Other giant cell arteritis: Secondary | ICD-10-CM | POA: Diagnosis not present

## 2018-10-22 DIAGNOSIS — M316 Other giant cell arteritis: Secondary | ICD-10-CM | POA: Diagnosis not present

## 2018-10-22 DIAGNOSIS — M6281 Muscle weakness (generalized): Secondary | ICD-10-CM | POA: Diagnosis not present

## 2018-10-23 DIAGNOSIS — J310 Chronic rhinitis: Secondary | ICD-10-CM | POA: Diagnosis not present

## 2018-10-23 DIAGNOSIS — R0609 Other forms of dyspnea: Secondary | ICD-10-CM | POA: Diagnosis not present

## 2018-10-23 DIAGNOSIS — M316 Other giant cell arteritis: Secondary | ICD-10-CM | POA: Diagnosis not present

## 2018-10-23 DIAGNOSIS — J449 Chronic obstructive pulmonary disease, unspecified: Secondary | ICD-10-CM | POA: Diagnosis not present

## 2018-11-06 DIAGNOSIS — M316 Other giant cell arteritis: Secondary | ICD-10-CM | POA: Diagnosis not present

## 2018-11-06 DIAGNOSIS — R42 Dizziness and giddiness: Secondary | ICD-10-CM | POA: Diagnosis not present

## 2018-11-06 DIAGNOSIS — R739 Hyperglycemia, unspecified: Secondary | ICD-10-CM | POA: Diagnosis not present

## 2018-11-06 DIAGNOSIS — H353211 Exudative age-related macular degeneration, right eye, with active choroidal neovascularization: Secondary | ICD-10-CM | POA: Diagnosis not present

## 2018-11-06 DIAGNOSIS — H47012 Ischemic optic neuropathy, left eye: Secondary | ICD-10-CM | POA: Diagnosis not present

## 2018-11-06 DIAGNOSIS — K219 Gastro-esophageal reflux disease without esophagitis: Secondary | ICD-10-CM | POA: Diagnosis not present

## 2018-11-06 DIAGNOSIS — I10 Essential (primary) hypertension: Secondary | ICD-10-CM | POA: Diagnosis not present

## 2018-11-16 ENCOUNTER — Other Ambulatory Visit: Payer: Self-pay | Admitting: *Deleted

## 2018-11-16 DIAGNOSIS — D72829 Elevated white blood cell count, unspecified: Secondary | ICD-10-CM

## 2018-11-20 ENCOUNTER — Inpatient Hospital Stay: Payer: PPO

## 2018-11-20 ENCOUNTER — Inpatient Hospital Stay: Payer: PPO | Admitting: Oncology

## 2018-11-28 DIAGNOSIS — M316 Other giant cell arteritis: Secondary | ICD-10-CM | POA: Diagnosis not present

## 2018-12-31 DIAGNOSIS — M316 Other giant cell arteritis: Secondary | ICD-10-CM | POA: Diagnosis not present

## 2019-01-08 DIAGNOSIS — M25551 Pain in right hip: Secondary | ICD-10-CM | POA: Diagnosis not present

## 2019-01-08 DIAGNOSIS — M316 Other giant cell arteritis: Secondary | ICD-10-CM | POA: Diagnosis not present

## 2019-01-08 DIAGNOSIS — M6281 Muscle weakness (generalized): Secondary | ICD-10-CM | POA: Diagnosis not present

## 2019-01-08 DIAGNOSIS — R262 Difficulty in walking, not elsewhere classified: Secondary | ICD-10-CM | POA: Diagnosis not present

## 2019-01-09 DIAGNOSIS — M316 Other giant cell arteritis: Secondary | ICD-10-CM | POA: Diagnosis not present

## 2019-01-11 DIAGNOSIS — M25551 Pain in right hip: Secondary | ICD-10-CM | POA: Diagnosis not present

## 2019-01-11 DIAGNOSIS — R262 Difficulty in walking, not elsewhere classified: Secondary | ICD-10-CM | POA: Diagnosis not present

## 2019-01-11 DIAGNOSIS — M6281 Muscle weakness (generalized): Secondary | ICD-10-CM | POA: Diagnosis not present

## 2019-01-11 DIAGNOSIS — M316 Other giant cell arteritis: Secondary | ICD-10-CM | POA: Diagnosis not present

## 2019-01-18 DIAGNOSIS — M25551 Pain in right hip: Secondary | ICD-10-CM | POA: Diagnosis not present

## 2019-01-18 DIAGNOSIS — M316 Other giant cell arteritis: Secondary | ICD-10-CM | POA: Diagnosis not present

## 2019-01-18 DIAGNOSIS — M6281 Muscle weakness (generalized): Secondary | ICD-10-CM | POA: Diagnosis not present

## 2019-01-18 DIAGNOSIS — R262 Difficulty in walking, not elsewhere classified: Secondary | ICD-10-CM | POA: Diagnosis not present

## 2019-01-21 DIAGNOSIS — R262 Difficulty in walking, not elsewhere classified: Secondary | ICD-10-CM | POA: Diagnosis not present

## 2019-01-21 DIAGNOSIS — M25551 Pain in right hip: Secondary | ICD-10-CM | POA: Diagnosis not present

## 2019-01-21 DIAGNOSIS — M6281 Muscle weakness (generalized): Secondary | ICD-10-CM | POA: Diagnosis not present

## 2019-01-21 DIAGNOSIS — M316 Other giant cell arteritis: Secondary | ICD-10-CM | POA: Diagnosis not present

## 2019-01-23 DIAGNOSIS — R262 Difficulty in walking, not elsewhere classified: Secondary | ICD-10-CM | POA: Diagnosis not present

## 2019-01-23 DIAGNOSIS — M25551 Pain in right hip: Secondary | ICD-10-CM | POA: Diagnosis not present

## 2019-01-23 DIAGNOSIS — M316 Other giant cell arteritis: Secondary | ICD-10-CM | POA: Diagnosis not present

## 2019-01-23 DIAGNOSIS — M6281 Muscle weakness (generalized): Secondary | ICD-10-CM | POA: Diagnosis not present

## 2019-01-25 ENCOUNTER — Other Ambulatory Visit: Payer: Self-pay | Admitting: Orthopedic Surgery

## 2019-01-25 DIAGNOSIS — M1611 Unilateral primary osteoarthritis, right hip: Secondary | ICD-10-CM | POA: Diagnosis not present

## 2019-01-25 DIAGNOSIS — M25551 Pain in right hip: Secondary | ICD-10-CM | POA: Diagnosis not present

## 2019-01-29 DIAGNOSIS — H353211 Exudative age-related macular degeneration, right eye, with active choroidal neovascularization: Secondary | ICD-10-CM | POA: Diagnosis not present

## 2019-02-01 DIAGNOSIS — M316 Other giant cell arteritis: Secondary | ICD-10-CM | POA: Diagnosis not present

## 2019-02-01 DIAGNOSIS — R739 Hyperglycemia, unspecified: Secondary | ICD-10-CM | POA: Diagnosis not present

## 2019-02-05 DIAGNOSIS — R42 Dizziness and giddiness: Secondary | ICD-10-CM | POA: Diagnosis not present

## 2019-02-05 DIAGNOSIS — K649 Unspecified hemorrhoids: Secondary | ICD-10-CM | POA: Diagnosis not present

## 2019-02-05 DIAGNOSIS — E039 Hypothyroidism, unspecified: Secondary | ICD-10-CM | POA: Diagnosis not present

## 2019-02-05 DIAGNOSIS — I10 Essential (primary) hypertension: Secondary | ICD-10-CM | POA: Diagnosis not present

## 2019-02-05 DIAGNOSIS — M25551 Pain in right hip: Secondary | ICD-10-CM | POA: Diagnosis not present

## 2019-02-06 ENCOUNTER — Ambulatory Visit
Admission: RE | Admit: 2019-02-06 | Discharge: 2019-02-06 | Disposition: A | Discharge status: Home / Self Care | Payer: PPO | Source: Ambulatory Visit | Attending: Orthopedic Surgery | Admitting: Orthopedic Surgery

## 2019-02-06 ENCOUNTER — Other Ambulatory Visit: Payer: Self-pay

## 2019-02-06 DIAGNOSIS — M1611 Unilateral primary osteoarthritis, right hip: Secondary | ICD-10-CM | POA: Diagnosis not present

## 2019-02-11 ENCOUNTER — Ambulatory Visit: Payer: PPO

## 2019-02-12 DIAGNOSIS — E039 Hypothyroidism, unspecified: Secondary | ICD-10-CM | POA: Diagnosis not present

## 2019-02-12 DIAGNOSIS — R0602 Shortness of breath: Secondary | ICD-10-CM | POA: Diagnosis not present

## 2019-02-12 DIAGNOSIS — I10 Essential (primary) hypertension: Secondary | ICD-10-CM | POA: Diagnosis not present

## 2019-02-15 DIAGNOSIS — M87151 Osteonecrosis due to drugs, right femur: Secondary | ICD-10-CM | POA: Diagnosis not present

## 2019-02-18 DIAGNOSIS — M316 Other giant cell arteritis: Secondary | ICD-10-CM | POA: Diagnosis not present

## 2019-02-21 DIAGNOSIS — K625 Hemorrhage of anus and rectum: Secondary | ICD-10-CM | POA: Diagnosis not present

## 2019-02-22 DIAGNOSIS — M1711 Unilateral primary osteoarthritis, right knee: Secondary | ICD-10-CM | POA: Diagnosis not present

## 2019-02-26 DIAGNOSIS — K625 Hemorrhage of anus and rectum: Secondary | ICD-10-CM | POA: Diagnosis not present

## 2019-03-01 DIAGNOSIS — R079 Chest pain, unspecified: Secondary | ICD-10-CM | POA: Diagnosis not present

## 2019-03-11 DIAGNOSIS — M316 Other giant cell arteritis: Secondary | ICD-10-CM | POA: Diagnosis not present

## 2019-03-20 ENCOUNTER — Other Ambulatory Visit: Payer: Self-pay

## 2019-03-20 ENCOUNTER — Encounter
Admission: RE | Admit: 2019-03-20 | Discharge: 2019-03-20 | Disposition: A | Discharge status: Home / Self Care | Payer: PPO | Source: Ambulatory Visit | Attending: Orthopedic Surgery | Admitting: Orthopedic Surgery

## 2019-03-20 DIAGNOSIS — E039 Hypothyroidism, unspecified: Secondary | ICD-10-CM | POA: Diagnosis not present

## 2019-03-20 DIAGNOSIS — J449 Chronic obstructive pulmonary disease, unspecified: Secondary | ICD-10-CM | POA: Diagnosis not present

## 2019-03-20 DIAGNOSIS — I10 Essential (primary) hypertension: Secondary | ICD-10-CM | POA: Diagnosis not present

## 2019-03-20 DIAGNOSIS — Z1159 Encounter for screening for other viral diseases: Secondary | ICD-10-CM | POA: Diagnosis not present

## 2019-03-20 DIAGNOSIS — Z7982 Long term (current) use of aspirin: Secondary | ICD-10-CM | POA: Diagnosis not present

## 2019-03-20 DIAGNOSIS — M1611 Unilateral primary osteoarthritis, right hip: Secondary | ICD-10-CM | POA: Insufficient documentation

## 2019-03-20 DIAGNOSIS — Z79890 Hormone replacement therapy (postmenopausal): Secondary | ICD-10-CM | POA: Insufficient documentation

## 2019-03-20 DIAGNOSIS — Z79899 Other long term (current) drug therapy: Secondary | ICD-10-CM | POA: Diagnosis not present

## 2019-03-20 DIAGNOSIS — Z01812 Encounter for preprocedural laboratory examination: Secondary | ICD-10-CM | POA: Insufficient documentation

## 2019-03-20 HISTORY — DX: Dyspnea, unspecified: R06.00

## 2019-03-20 HISTORY — DX: Family history of other specified conditions: Z84.89

## 2019-03-20 HISTORY — DX: Other giant cell arteritis: M31.6

## 2019-03-20 HISTORY — DX: Other seasonal allergic rhinitis: J30.2

## 2019-03-20 LAB — BASIC METABOLIC PANEL
Anion gap: 11 (ref 5–15)
BUN: 19 mg/dL (ref 8–23)
CO2: 26 mmol/L (ref 22–32)
Calcium: 9.5 mg/dL (ref 8.9–10.3)
Chloride: 101 mmol/L (ref 98–111)
Creatinine, Ser: 0.88 mg/dL (ref 0.44–1.00)
GFR calc Af Amer: >60 mL/min (ref >60)
GFR calc non Af Amer: 59 mL/min — ABNORMAL LOW (ref >60)
Glucose, Bld: 86 mg/dL (ref 70–99)
Potassium: 3.5 mmol/L (ref 3.5–5.1)
Sodium: 138 mmol/L (ref 135–145)

## 2019-03-20 LAB — URINALYSIS, COMPLETE (UACMP) WITH MICROSCOPIC
Bilirubin Urine: NEGATIVE
Glucose, UA: NEGATIVE mg/dL
Hgb urine dipstick: NEGATIVE
Ketones, ur: NEGATIVE mg/dL
Leukocytes,Ua: NEGATIVE
Nitrite: NEGATIVE
Protein, ur: NEGATIVE mg/dL
Specific Gravity, Urine: 1.004 — ABNORMAL LOW (ref 1.005–1.030)
pH: 7 (ref 5.0–8.0)

## 2019-03-20 LAB — CBC
HCT: 44.8 % (ref 36.0–46.0)
Hemoglobin: 14.5 g/dL (ref 12.0–15.0)
MCH: 32.4 pg (ref 26.0–34.0)
MCHC: 32.4 g/dL (ref 30.0–36.0)
MCV: 100.2 fL — ABNORMAL HIGH (ref 80.0–100.0)
Platelets: 202 10*3/uL (ref 150–400)
RBC: 4.47 MIL/uL (ref 3.87–5.11)
RDW: 13.8 % (ref 11.5–15.5)
WBC: 10.6 10*3/uL — ABNORMAL HIGH (ref 4.0–10.5)
nRBC: 0 % (ref 0.0–0.2)

## 2019-03-20 LAB — SURGICAL PCR SCREEN
MRSA, PCR: NEGATIVE
Staphylococcus aureus: NEGATIVE

## 2019-03-20 LAB — SEDIMENTATION RATE: Sed Rate: 1 mm/hr (ref 0–30)

## 2019-03-20 LAB — PROTIME-INR
INR: 0.9 (ref 0.8–1.2)
Prothrombin Time: 11.8 seconds (ref 11.4–15.2)

## 2019-03-20 LAB — APTT: aPTT: 27 seconds (ref 24–36)

## 2019-03-20 NOTE — Pre-Procedure Instructions (Signed)
Called Dr Theodore Demark office for orders.

## 2019-03-20 NOTE — Patient Instructions (Signed)
Your procedure is scheduled on: 03/26/2019 Tues Report to Same Day Surgery 2nd floor medical mall [facility] Entrance-take elevator on left to 2nd floor.  Check in with surgery information desk.) To find out your arrival time please call [phone] between 1PM - 3PM on 03/25/2019 Mon  Remember: Instructions that are not followed completely may result in serious medical risk, up to and including death, or upon the discretion of your surgeon and anesthesiologist your surgery may need to be rescheduled.    _x___ 1. Do not eat food after midnight the night before your procedure. You may drink clear liquids up to 2 hours before you are scheduled to arrive at the hospital for your procedure.  Do not drink clear liquids within 2 hours of your scheduled arrival to the hospital.  Clear liquids include  --Water or Apple juice without pulp  --Clear carbohydrate beverage such as ClearFast or Gatorade  --Black Coffee or Clear Tea (No milk, no creamers, do not add anything to                  the coffee or Tea Type 1 and type 2 diabetics should only drink water.   ____Ensure clear carbohydrate drink on the way to the hospital for bariatric patients  ____Ensure clear carbohydrate drink 3 hours before surgery for Dr Dwyane Luo patients if physician instructed.   No gum chewing or hard candies.     __x__ 2. No Alcohol for 24 hours before or after surgery.   __x__3. No Smoking or e-cigarettes for 24 prior to surgery.  Do not use any chewable tobacco products for at least 6 hour prior to surgery   ____  4. Bring all medications with you on the day of surgery if instructed.    __x__ 5. Notify your doctor if there is any change in your medical condition     (cold, fever, infections).    x___6. On the morning of surgery brush your teeth with toothpaste and water.  You may rinse your mouth with mouth wash if you wish.  Do not swallow any toothpaste or mouthwash.   Do not wear jewelry, make-up,  hairpins, clips or nail polish.  Do not wear lotions, powders, or perfumes. You may wear deodorant.  Do not shave 48 hours prior to surgery. Men may shave face and neck.  Do not bring valuables to the hospital.    [facility] is not responsible for any belongings or valuables.               Contacts, dentures or bridgework may not be worn into surgery.  Leave your suitcase in the car. After surgery it may be brought to your room.  For patients admitted to the hospital, discharge time is determined by your                       treatment team.  _  Patients discharged the day of surgery will not be allowed to drive home.  You will need someone to drive you home and stay with you the night of your procedure.    Please read over the following fact sheets that you were given:   [facility] Preparing for Surgery and or MRSA Information   _x___ Take anti-hypertensive listed below, cardiac, seizure, asthma,     anti-reflux and psychiatric medicines. These include:  1. atenolol (TENORMIN) 25 MG tablet  2.azelastine (ASTELIN) 0.1 % nasal spray  3.fluticasone (FLONASE) 50 MCG/ACT nasal spray  4.levothyroxine (SYNTHROID, LEVOTHROID) 125 MCG tablet  5.predniSONE (DELTASONE) 5 MG tablet  6.  ____Fleets enema or Magnesium Citrate as directed.   _x___ Use CHG Soap or sage wipes as directed on instruction sheet   ____ Use inhalers on the day of surgery and bring to hospital day of surgery  ____ Stop Metformin and Janumet 2 days prior to surgery.    ____ Take 1/2 of usual insulin dose the night before surgery and none on the morning     surgery.   _x___ Follow recommendations from Cardiologist, Pulmonologist or PCP regarding          stopping Aspirin, Coumadin, Plavix ,Eliquis, Effient, or Pradaxa, and Pletal.  X____Stop Anti-inflammatories such as Advil, Aleve, Ibuprofen, Motrin, Naproxen, Naprosyn, Goodies powders or aspirin products. OK to take Tylenol and                           Celebrex.   _x___ Stop supplements until after surgery.  But may continue Vitamin D, Vitamin B,       and multivitamin.   ____ Bring C-Pap to the hospital.

## 2019-03-21 LAB — URINE CULTURE: Culture: <10000 — AB

## 2019-03-22 ENCOUNTER — Other Ambulatory Visit: Payer: Self-pay

## 2019-03-22 ENCOUNTER — Other Ambulatory Visit
Admission: RE | Admit: 2019-03-22 | Discharge: 2019-03-22 | Disposition: A | Discharge status: Home / Self Care | Payer: PPO | Source: Ambulatory Visit | Attending: Orthopedic Surgery | Admitting: Orthopedic Surgery

## 2019-03-22 DIAGNOSIS — Z01812 Encounter for preprocedural laboratory examination: Secondary | ICD-10-CM | POA: Diagnosis not present

## 2019-03-22 LAB — SARS CORONAVIRUS 2 (TAT 6-24 HRS): SARS Coronavirus 2: NEGATIVE

## 2019-03-25 MED ORDER — CEFAZOLIN SODIUM-DEXTROSE 2-4 GM/100ML-% IV SOLN
2.0000 g | Freq: Once | INTRAVENOUS | Status: AC
  Administered 2019-03-26: 2 g via INTRAVENOUS

## 2019-03-26 ENCOUNTER — Inpatient Hospital Stay: Payer: PPO

## 2019-03-26 ENCOUNTER — Encounter
Admission: RE | Disposition: A | Discharge status: Home Health | Payer: Self-pay | Source: Ambulatory Visit | Attending: Orthopedic Surgery

## 2019-03-26 ENCOUNTER — Inpatient Hospital Stay: Payer: PPO | Admitting: Certified Registered Nurse Anesthetist

## 2019-03-26 ENCOUNTER — Other Ambulatory Visit: Payer: Self-pay

## 2019-03-26 ENCOUNTER — Inpatient Hospital Stay
Admission: RE | Admit: 2019-03-26 | Discharge: 2019-03-28 | DRG: 470 | Disposition: A | Discharge status: Home Health | Payer: PPO | Source: Ambulatory Visit | Attending: Orthopedic Surgery | Admitting: Orthopedic Surgery

## 2019-03-26 ENCOUNTER — Encounter: Payer: Self-pay | Admitting: *Deleted

## 2019-03-26 DIAGNOSIS — Z888 Allergy status to other drugs, medicaments and biological substances status: Secondary | ICD-10-CM | POA: Diagnosis not present

## 2019-03-26 DIAGNOSIS — Z885 Allergy status to narcotic agent status: Secondary | ICD-10-CM

## 2019-03-26 DIAGNOSIS — Z7952 Long term (current) use of systemic steroids: Secondary | ICD-10-CM

## 2019-03-26 DIAGNOSIS — Z79899 Other long term (current) drug therapy: Secondary | ICD-10-CM | POA: Diagnosis not present

## 2019-03-26 DIAGNOSIS — M1611 Unilateral primary osteoarthritis, right hip: Secondary | ICD-10-CM | POA: Diagnosis not present

## 2019-03-26 DIAGNOSIS — Z7982 Long term (current) use of aspirin: Secondary | ICD-10-CM | POA: Diagnosis not present

## 2019-03-26 DIAGNOSIS — Z79890 Hormone replacement therapy (postmenopausal): Secondary | ICD-10-CM | POA: Diagnosis not present

## 2019-03-26 DIAGNOSIS — M25551 Pain in right hip: Secondary | ICD-10-CM | POA: Diagnosis present

## 2019-03-26 DIAGNOSIS — Z96641 Presence of right artificial hip joint: Secondary | ICD-10-CM

## 2019-03-26 DIAGNOSIS — Z7983 Long term (current) use of bisphosphonates: Secondary | ICD-10-CM | POA: Diagnosis not present

## 2019-03-26 DIAGNOSIS — I10 Essential (primary) hypertension: Secondary | ICD-10-CM | POA: Diagnosis not present

## 2019-03-26 DIAGNOSIS — M87151 Osteonecrosis due to drugs, right femur: Secondary | ICD-10-CM | POA: Diagnosis present

## 2019-03-26 DIAGNOSIS — Z471 Aftercare following joint replacement surgery: Secondary | ICD-10-CM | POA: Diagnosis not present

## 2019-03-26 DIAGNOSIS — D62 Acute posthemorrhagic anemia: Secondary | ICD-10-CM | POA: Diagnosis not present

## 2019-03-26 DIAGNOSIS — J302 Other seasonal allergic rhinitis: Secondary | ICD-10-CM | POA: Diagnosis not present

## 2019-03-26 DIAGNOSIS — M25562 Pain in left knee: Secondary | ICD-10-CM | POA: Diagnosis not present

## 2019-03-26 DIAGNOSIS — Z87891 Personal history of nicotine dependence: Secondary | ICD-10-CM

## 2019-03-26 DIAGNOSIS — G8918 Other acute postprocedural pain: Secondary | ICD-10-CM

## 2019-03-26 DIAGNOSIS — M316 Other giant cell arteritis: Secondary | ICD-10-CM | POA: Diagnosis not present

## 2019-03-26 DIAGNOSIS — J449 Chronic obstructive pulmonary disease, unspecified: Secondary | ICD-10-CM | POA: Diagnosis not present

## 2019-03-26 DIAGNOSIS — Z419 Encounter for procedure for purposes other than remedying health state, unspecified: Secondary | ICD-10-CM

## 2019-03-26 DIAGNOSIS — Z8249 Family history of ischemic heart disease and other diseases of the circulatory system: Secondary | ICD-10-CM

## 2019-03-26 DIAGNOSIS — M87851 Other osteonecrosis, right femur: Secondary | ICD-10-CM | POA: Diagnosis not present

## 2019-03-26 DIAGNOSIS — E039 Hypothyroidism, unspecified: Secondary | ICD-10-CM | POA: Diagnosis not present

## 2019-03-26 DIAGNOSIS — M87051 Idiopathic aseptic necrosis of right femur: Secondary | ICD-10-CM | POA: Diagnosis not present

## 2019-03-26 HISTORY — PX: TOTAL HIP ARTHROPLASTY: SHX124

## 2019-03-26 LAB — CREATININE, SERUM
Creatinine, Ser: 0.79 mg/dL (ref 0.44–1.00)
GFR calc Af Amer: >60 mL/min (ref >60)
GFR calc non Af Amer: >60 mL/min (ref >60)

## 2019-03-26 LAB — CBC
HCT: 40.2 % (ref 36.0–46.0)
Hemoglobin: 12.9 g/dL (ref 12.0–15.0)
MCH: 32.5 pg (ref 26.0–34.0)
MCHC: 32.1 g/dL (ref 30.0–36.0)
MCV: 101.3 fL — ABNORMAL HIGH (ref 80.0–100.0)
Platelets: 172 10*3/uL (ref 150–400)
RBC: 3.97 MIL/uL (ref 3.87–5.11)
RDW: 13.8 % (ref 11.5–15.5)
WBC: 12 10*3/uL — ABNORMAL HIGH (ref 4.0–10.5)
nRBC: 0 % (ref 0.0–0.2)

## 2019-03-26 LAB — ABO/RH: ABO/RH(D): A NEG

## 2019-03-26 SURGERY — ARTHROPLASTY, HIP, TOTAL,POSTERIOR APPROACH
Anesthesia: Spinal | Laterality: Right

## 2019-03-26 MED ORDER — METHOCARBAMOL 500 MG PO TABS: 500.0000 mg | ORAL_TABLET | Freq: Four times a day (QID) | ORAL | Status: DC | PRN

## 2019-03-26 MED ORDER — CALCIUM CARBONATE-VITAMIN D 500-200 MG-UNIT PO TABS
1.0000 | ORAL_TABLET | Freq: Two times a day (BID) | ORAL | Status: DC
  Administered 2019-03-26 – 2019-03-28 (×4): 1 via ORAL
  Filled 2019-03-26 (×6): qty 1

## 2019-03-26 MED ORDER — FAMOTIDINE 20 MG PO TABS
ORAL_TABLET | ORAL | Status: AC
  Filled 2019-03-26: qty 1

## 2019-03-26 MED ORDER — SODIUM CHLORIDE 0.9 % IV SOLN
INTRAVENOUS | Status: DC | PRN
  Administered 2019-03-26: 20 ug/min via INTRAVENOUS
  Administered 2019-03-26: 25 ug/min via INTRAVENOUS

## 2019-03-26 MED ORDER — ASPIRIN EC 81 MG PO TBEC
81.0000 mg | DELAYED_RELEASE_TABLET | Freq: Every evening | ORAL | Status: DC
  Administered 2019-03-26 – 2019-03-27 (×2): 81 mg via ORAL
  Filled 2019-03-26 (×2): qty 1

## 2019-03-26 MED ORDER — FAMOTIDINE 20 MG PO TABS
20.0000 mg | ORAL_TABLET | Freq: Once | ORAL | Status: DC
  Administered 2019-03-26: 07:00:00 20 mg via ORAL

## 2019-03-26 MED ORDER — METOCLOPRAMIDE HCL 10 MG PO TABS: 5.0000 mg | ORAL_TABLET | Freq: Three times a day (TID) | ORAL | Status: DC | PRN

## 2019-03-26 MED ORDER — POLYVINYL ALCOHOL 1.4 % OP SOLN
1.0000 [drp] | Freq: Two times a day (BID) | OPHTHALMIC | Status: DC | PRN
  Administered 2019-03-27: 1 [drp] via OPHTHALMIC
  Filled 2019-03-26 (×2): qty 15

## 2019-03-26 MED ORDER — MAGNESIUM CITRATE PO SOLN
1.0000 | Freq: Once | ORAL | Status: DC | PRN
  Filled 2019-03-26: qty 296

## 2019-03-26 MED ORDER — ACETAMINOPHEN 10 MG/ML IV SOLN
INTRAVENOUS | Status: DC | PRN
  Administered 2019-03-26: 750 mg via INTRAVENOUS

## 2019-03-26 MED ORDER — METOCLOPRAMIDE HCL 5 MG/ML IJ SOLN: 5.0000 mg | Freq: Three times a day (TID) | INTRAMUSCULAR | Status: DC | PRN

## 2019-03-26 MED ORDER — PREDNISONE 5 MG PO TABS
7.5000 mg | ORAL_TABLET | Freq: Every day | ORAL | Status: DC
  Administered 2019-03-27 – 2019-03-28 (×2): 7.5 mg via ORAL
  Filled 2019-03-26 (×2): qty 1

## 2019-03-26 MED ORDER — LOSARTAN POTASSIUM 25 MG PO TABS
25.0000 mg | ORAL_TABLET | Freq: Every day | ORAL | Status: DC
  Administered 2019-03-26 – 2019-03-28 (×3): 25 mg via ORAL
  Filled 2019-03-26 (×3): qty 1

## 2019-03-26 MED ORDER — HYDROCORTISONE ACETATE 25 MG RE SUPP
25.0000 mg | Freq: Two times a day (BID) | RECTAL | Status: DC | PRN
  Filled 2019-03-26: qty 1

## 2019-03-26 MED ORDER — MORPHINE SULFATE (PF) 2 MG/ML IV SOLN: 0.5000 mg | INTRAVENOUS | Status: DC | PRN

## 2019-03-26 MED ORDER — SODIUM CHLORIDE 0.9 % IV SOLN
INTRAVENOUS | Status: DC | PRN
  Administered 2019-03-26: 60 mL

## 2019-03-26 MED ORDER — TRAMADOL HCL 50 MG PO TABS
50.0000 mg | ORAL_TABLET | Freq: Four times a day (QID) | ORAL | Status: DC
  Administered 2019-03-26 – 2019-03-28 (×9): 50 mg via ORAL
  Filled 2019-03-26 (×9): qty 1

## 2019-03-26 MED ORDER — METHYLPREDNISOLONE SODIUM SUCC 125 MG IJ SOLR
INTRAMUSCULAR | Status: DC | PRN
  Administered 2019-03-26: 40 mg via INTRAVENOUS

## 2019-03-26 MED ORDER — LIDOCAINE HCL (CARDIAC) PF 100 MG/5ML IV SOSY
PREFILLED_SYRINGE | INTRAVENOUS | Status: DC | PRN
  Administered 2019-03-26: 50 mg via INTRAVENOUS

## 2019-03-26 MED ORDER — PROPOFOL 500 MG/50ML IV EMUL
INTRAVENOUS | Status: DC | PRN
  Administered 2019-03-26: 50 ug/kg/min via INTRAVENOUS

## 2019-03-26 MED ORDER — BUPIVACAINE HCL (PF) 0.5 % IJ SOLN
INTRAMUSCULAR | Status: AC
  Filled 2019-03-26: qty 30

## 2019-03-26 MED ORDER — MENTHOL 3 MG MT LOZG
1.0000 | LOZENGE | OROMUCOSAL | Status: DC | PRN
  Filled 2019-03-26: qty 9

## 2019-03-26 MED ORDER — ACETAMINOPHEN 325 MG PO TABS
325.0000 mg | ORAL_TABLET | Freq: Four times a day (QID) | ORAL | Status: DC | PRN
  Administered 2019-03-26: 500 mg via ORAL

## 2019-03-26 MED ORDER — AZELASTINE HCL 0.1 % NA SOLN
1.0000 | Freq: Every day | NASAL | Status: DC | PRN
  Filled 2019-03-26: qty 30

## 2019-03-26 MED ORDER — DOCUSATE SODIUM 100 MG PO CAPS
100.0000 mg | ORAL_CAPSULE | Freq: Two times a day (BID) | ORAL | Status: DC
  Administered 2019-03-26 – 2019-03-28 (×4): 100 mg via ORAL
  Filled 2019-03-26 (×4): qty 1

## 2019-03-26 MED ORDER — MONTELUKAST SODIUM 10 MG PO TABS
10.0000 mg | ORAL_TABLET | Freq: Every day | ORAL | Status: DC
  Administered 2019-03-26 – 2019-03-27 (×2): 10 mg via ORAL
  Filled 2019-03-26 (×2): qty 1

## 2019-03-26 MED ORDER — DIPHENHYDRAMINE HCL 12.5 MG/5ML PO ELIX: 12.5000 mg | ORAL_SOLUTION | ORAL | Status: DC | PRN

## 2019-03-26 MED ORDER — CEFAZOLIN SODIUM-DEXTROSE 1-4 GM/50ML-% IV SOLN
1.0000 g | Freq: Four times a day (QID) | INTRAVENOUS | Status: AC
  Administered 2019-03-26 (×2): 1 g via INTRAVENOUS
  Filled 2019-03-26 (×2): qty 50

## 2019-03-26 MED ORDER — NEOMYCIN-POLYMYXIN B GU 40-200000 IR SOLN
Status: DC | PRN
  Administered 2019-03-26: 4 mL

## 2019-03-26 MED ORDER — LACTATED RINGERS IV SOLN
INTRAVENOUS | Status: DC
  Administered 2019-03-26: 07:00:00 via INTRAVENOUS

## 2019-03-26 MED ORDER — METHOCARBAMOL 1000 MG/10ML IJ SOLN
500.0000 mg | Freq: Four times a day (QID) | INTRAVENOUS | Status: DC | PRN
  Filled 2019-03-26: qty 5

## 2019-03-26 MED ORDER — EPHEDRINE SULFATE 50 MG/ML IJ SOLN
INTRAMUSCULAR | Status: AC
  Filled 2019-03-26: qty 1

## 2019-03-26 MED ORDER — PROPOFOL 10 MG/ML IV BOLUS
INTRAVENOUS | Status: AC
  Filled 2019-03-26: qty 80

## 2019-03-26 MED ORDER — BUPIVACAINE HCL (PF) 0.5 % IJ SOLN
INTRAMUSCULAR | Status: DC | PRN
  Administered 2019-03-26: 2.5 mL

## 2019-03-26 MED ORDER — HYDROCODONE-ACETAMINOPHEN 5-325 MG PO TABS
1.0000 | ORAL_TABLET | ORAL | Status: DC | PRN
  Administered 2019-03-26: 2 via ORAL
  Administered 2019-03-27 – 2019-03-28 (×2): 1 via ORAL
  Filled 2019-03-26: qty 2
  Filled 2019-03-26: qty 1
  Filled 2019-03-26: qty 2

## 2019-03-26 MED ORDER — SODIUM CHLORIDE 0.9 % IV SOLN
INTRAVENOUS | Status: DC
  Administered 2019-03-26: 12:00:00 via INTRAVENOUS

## 2019-03-26 MED ORDER — ATENOLOL 25 MG PO TABS
25.0000 mg | ORAL_TABLET | ORAL | Status: DC
  Administered 2019-03-27 – 2019-03-28 (×2): 25 mg via ORAL
  Filled 2019-03-26 (×3): qty 1

## 2019-03-26 MED ORDER — ACETAMINOPHEN 500 MG PO TABS
500.0000 mg | ORAL_TABLET | Freq: Four times a day (QID) | ORAL | Status: AC
  Administered 2019-03-27: 500 mg via ORAL
  Filled 2019-03-26 (×3): qty 1

## 2019-03-26 MED ORDER — SODIUM CHLORIDE FLUSH 0.9 % IV SOLN
INTRAVENOUS | Status: AC
  Filled 2019-03-26: qty 40

## 2019-03-26 MED ORDER — SODIUM CHLORIDE (PF) 0.9 % IJ SOLN
INTRAMUSCULAR | Status: AC
  Filled 2019-03-26: qty 50

## 2019-03-26 MED ORDER — ZOLPIDEM TARTRATE 5 MG PO TABS: 5.0000 mg | ORAL_TABLET | Freq: Every evening | ORAL | Status: DC | PRN

## 2019-03-26 MED ORDER — ACETAMINOPHEN 10 MG/ML IV SOLN
INTRAVENOUS | Status: AC
  Filled 2019-03-26: qty 100

## 2019-03-26 MED ORDER — BISACODYL 5 MG PO TBEC
5.0000 mg | DELAYED_RELEASE_TABLET | Freq: Every day | ORAL | Status: DC | PRN
  Administered 2019-03-27: 5 mg via ORAL
  Filled 2019-03-26: qty 1

## 2019-03-26 MED ORDER — METHYLPREDNISOLONE SODIUM SUCC 40 MG IJ SOLR
40.0000 mg | Freq: Two times a day (BID) | INTRAMUSCULAR | Status: AC
  Administered 2019-03-26 – 2019-03-27 (×3): 40 mg via INTRAVENOUS
  Filled 2019-03-26 (×3): qty 1

## 2019-03-26 MED ORDER — ALENDRONATE SODIUM 70 MG PO TABS
70.0000 mg | ORAL_TABLET | ORAL | Status: DC
  Filled 2019-03-26: qty 1

## 2019-03-26 MED ORDER — ENOXAPARIN SODIUM 30 MG/0.3ML ~~LOC~~ SOLN
30.0000 mg | Freq: Two times a day (BID) | SUBCUTANEOUS | Status: DC
  Administered 2019-03-27 – 2019-03-28 (×3): 30 mg via SUBCUTANEOUS
  Filled 2019-03-26 (×3): qty 0.3

## 2019-03-26 MED ORDER — PHENYLEPHRINE HCL (PRESSORS) 10 MG/ML IV SOLN
INTRAVENOUS | Status: AC
  Filled 2019-03-26: qty 1

## 2019-03-26 MED ORDER — PROPOFOL 10 MG/ML IV BOLUS
INTRAVENOUS | Status: DC | PRN
  Administered 2019-03-26 (×2): 30 mg via INTRAVENOUS

## 2019-03-26 MED ORDER — HYDROCODONE-ACETAMINOPHEN 7.5-325 MG PO TABS: 1.0000 | ORAL_TABLET | ORAL | Status: DC | PRN

## 2019-03-26 MED ORDER — NEOMYCIN-POLYMYXIN B GU 40-200000 IR SOLN
Status: AC
  Filled 2019-03-26: qty 20

## 2019-03-26 MED ORDER — OCUVITE-LUTEIN PO CAPS
1.0000 | ORAL_CAPSULE | Freq: Two times a day (BID) | ORAL | Status: DC
  Administered 2019-03-26 – 2019-03-27 (×3): 1 via ORAL
  Filled 2019-03-26 (×7): qty 1

## 2019-03-26 MED ORDER — CEFAZOLIN SODIUM-DEXTROSE 2-4 GM/100ML-% IV SOLN
INTRAVENOUS | Status: AC
  Filled 2019-03-26: qty 100

## 2019-03-26 MED ORDER — ADULT MULTIVITAMIN W/MINERALS CH
1.0000 | ORAL_TABLET | Freq: Every evening | ORAL | Status: DC
  Administered 2019-03-26 – 2019-03-27 (×2): 1 via ORAL
  Filled 2019-03-26 (×3): qty 1

## 2019-03-26 MED ORDER — FENTANYL CITRATE (PF) 100 MCG/2ML IJ SOLN: 25.0000 ug | INTRAMUSCULAR | Status: DC | PRN

## 2019-03-26 MED ORDER — ARTIFICIAL TEARS OPHTHALMIC OINT
1.0000 "application " | TOPICAL_OINTMENT | Freq: Every day | OPHTHALMIC | Status: DC
  Administered 2019-03-26: 1 via OPHTHALMIC
  Filled 2019-03-26: qty 3.5

## 2019-03-26 MED ORDER — ALUM & MAG HYDROXIDE-SIMETH 200-200-20 MG/5ML PO SUSP
30.0000 mL | ORAL | Status: DC | PRN
  Administered 2019-03-28: 30 mL via ORAL
  Filled 2019-03-26: qty 30

## 2019-03-26 MED ORDER — BUPIVACAINE-EPINEPHRINE 0.25% -1:200000 IJ SOLN
INTRAMUSCULAR | Status: DC | PRN
  Administered 2019-03-26: 30 mL

## 2019-03-26 MED ORDER — ONDANSETRON HCL 4 MG/2ML IJ SOLN: 4.0000 mg | Freq: Once | INTRAMUSCULAR | Status: DC | PRN

## 2019-03-26 MED ORDER — FLUTICASONE PROPIONATE 50 MCG/ACT NA SUSP
2.0000 | Freq: Every day | NASAL | Status: DC
  Administered 2019-03-27: 2 via NASAL
  Filled 2019-03-26: qty 16

## 2019-03-26 MED ORDER — EPHEDRINE SULFATE 50 MG/ML IJ SOLN
INTRAMUSCULAR | Status: DC | PRN
  Administered 2019-03-26: 5 mg via INTRAVENOUS
  Administered 2019-03-26 (×2): 7.5 mg via INTRAVENOUS

## 2019-03-26 MED ORDER — ONDANSETRON HCL 4 MG PO TABS: 4.0000 mg | ORAL_TABLET | Freq: Four times a day (QID) | ORAL | Status: DC | PRN

## 2019-03-26 MED ORDER — LEVOTHYROXINE SODIUM 25 MCG PO TABS
125.0000 ug | ORAL_TABLET | Freq: Every day | ORAL | Status: DC
  Administered 2019-03-27 – 2019-03-28 (×2): 125 ug via ORAL
  Filled 2019-03-26 (×2): qty 1

## 2019-03-26 MED ORDER — SODIUM CHLORIDE (PF) 0.9 % IJ SOLN
INTRAMUSCULAR | Status: AC
  Filled 2019-03-26: qty 20

## 2019-03-26 MED ORDER — SPIRONOLACTONE 25 MG PO TABS
25.0000 mg | ORAL_TABLET | Freq: Every day | ORAL | Status: DC
  Administered 2019-03-27 – 2019-03-28 (×2): 25 mg via ORAL
  Filled 2019-03-26 (×2): qty 1

## 2019-03-26 MED ORDER — BUPIVACAINE LIPOSOME 1.3 % IJ SUSP
INTRAMUSCULAR | Status: AC
  Filled 2019-03-26: qty 20

## 2019-03-26 MED ORDER — PHENOL 1.4 % MT LIQD
1.0000 | OROMUCOSAL | Status: DC | PRN
  Filled 2019-03-26: qty 177

## 2019-03-26 MED ORDER — MAGNESIUM HYDROXIDE 400 MG/5ML PO SUSP
30.0000 mL | Freq: Every day | ORAL | Status: DC | PRN
  Administered 2019-03-27: 30 mL via ORAL
  Filled 2019-03-26: qty 30

## 2019-03-26 MED ORDER — BUPIVACAINE-EPINEPHRINE (PF) 0.25% -1:200000 IJ SOLN
INTRAMUSCULAR | Status: AC
  Filled 2019-03-26: qty 30

## 2019-03-26 MED ORDER — ONDANSETRON HCL 4 MG/2ML IJ SOLN: 4.0000 mg | Freq: Four times a day (QID) | INTRAMUSCULAR | Status: DC | PRN

## 2019-03-26 SURGICAL SUPPLY — 61 items
APL PRP STRL LF DISP 70% ISPRP (MISCELLANEOUS) ×1
BLADE SAGITTAL AGGR TOOTH XLG (BLADE) ×2 IMPLANT
BNDG COHESIVE 6X5 TAN STRL LF (GAUZE/BANDAGES/DRESSINGS) ×6 IMPLANT
CANISTER SUCT 1200ML W/VALVE (MISCELLANEOUS) ×2 IMPLANT
CANISTER WOUND CARE 500ML ATS (WOUND CARE) ×2 IMPLANT
CHLORAPREP W/TINT 26 (MISCELLANEOUS) ×2 IMPLANT
COVER BACK TABLE REUSABLE LG (DRAPES) ×2 IMPLANT
COVER WAND RF STERILE (DRAPES) ×2 IMPLANT
DRAPE 3/4 80X56 (DRAPES) ×6 IMPLANT
DRAPE C-ARM XRAY 36X54 (DRAPES) ×2 IMPLANT
DRAPE INCISE IOBAN 66X60 STRL (DRAPES) IMPLANT
DRAPE POUCH INSTRU U-SHP 10X18 (DRAPES) ×2 IMPLANT
DRESSING SURGICEL FIBRLLR 1X2 (HEMOSTASIS) ×2 IMPLANT
DRSG OPSITE POSTOP 4X8 (GAUZE/BANDAGES/DRESSINGS) ×4 IMPLANT
DRSG SURGICEL FIBRILLAR 1X2 (HEMOSTASIS) ×4
ELECT BLADE 6.5 EXT (BLADE) ×2 IMPLANT
ELECT REM PT RETURN 9FT ADLT (ELECTROSURGICAL) ×2
ELECTRODE REM PT RTRN 9FT ADLT (ELECTROSURGICAL) ×1 IMPLANT
GLOVE BIOGEL PI IND STRL 9 (GLOVE) ×1 IMPLANT
GLOVE BIOGEL PI INDICATOR 9 (GLOVE) ×1
GLOVE SURG SYN 9.0  PF PI (GLOVE) ×2
GLOVE SURG SYN 9.0 PF PI (GLOVE) ×2 IMPLANT
GOWN SRG 2XL LVL 4 RGLN SLV (GOWNS) ×1 IMPLANT
GOWN STRL NON-REIN 2XL LVL4 (GOWNS) ×2
GOWN STRL REUS W/ TWL LRG LVL3 (GOWN DISPOSABLE) ×1 IMPLANT
GOWN STRL REUS W/TWL LRG LVL3 (GOWN DISPOSABLE) ×2
HEMOVAC 400CC 10FR (MISCELLANEOUS) IMPLANT
HIP FEM HD S 28 (Head) ×1 IMPLANT
HOLDER FOLEY CATH W/STRAP (MISCELLANEOUS) ×2 IMPLANT
HOOD PEEL AWAY FLYTE STAYCOOL (MISCELLANEOUS) ×2 IMPLANT
KIT PREVENA INCISION MGT 13 (CANNISTER) ×2 IMPLANT
LINER DM 28MM (Liner) ×1 IMPLANT
MAT ABSORB  FLUID 56X50 GRAY (MISCELLANEOUS) ×1
MAT ABSORB FLUID 56X50 GRAY (MISCELLANEOUS) ×1 IMPLANT
NDL SAFETY ECLIPSE 18X1.5 (NEEDLE) ×1 IMPLANT
NDL SPNL 20GX3.5 QUINCKE YW (NEEDLE) ×2 IMPLANT
NEEDLE HYPO 18GX1.5 SHARP (NEEDLE) ×2
NEEDLE SPNL 20GX3.5 QUINCKE YW (NEEDLE) ×4 IMPLANT
NS IRRIG 1000ML POUR BTL (IV SOLUTION) ×2 IMPLANT
PACK HIP COMPR (MISCELLANEOUS) ×2 IMPLANT
SCALPEL PROTECTED #10 DISP (BLADE) ×4 IMPLANT
SHELL ACETABULAR DM  48MM (Shell) ×1 IMPLANT
SOL PREP PVP 2OZ (MISCELLANEOUS) ×2
SOLUTION PREP PVP 2OZ (MISCELLANEOUS) ×1 IMPLANT
SPONGE DRAIN TRACH 4X4 STRL 2S (GAUZE/BANDAGES/DRESSINGS) ×2 IMPLANT
STAPLER SKIN PROX 35W (STAPLE) ×2 IMPLANT
STEM FEMORAL SZ2 STD COLLARED (Stem) ×1 IMPLANT
STRAP SAFETY 5IN WIDE (MISCELLANEOUS) ×2 IMPLANT
SUT DVC 2 QUILL PDO  T11 36X36 (SUTURE) ×1
SUT DVC 2 QUILL PDO T11 36X36 (SUTURE) ×1 IMPLANT
SUT SILK 0 (SUTURE) ×2
SUT SILK 0 30XBRD TIE 6 (SUTURE) ×1 IMPLANT
SUT V-LOC 90 ABS DVC 3-0 CL (SUTURE) ×2 IMPLANT
SUT VIC AB 1 CT1 36 (SUTURE) ×2 IMPLANT
SYR 20CC LL (SYRINGE) ×2 IMPLANT
SYR 30ML LL (SYRINGE) ×2 IMPLANT
SYR 50ML LL SCALE MARK (SYRINGE) ×4 IMPLANT
SYR BULB IRRIG 60ML STRL (SYRINGE) ×2 IMPLANT
TAPE MICROFOAM 4IN (TAPE) ×2 IMPLANT
TOWEL OR 17X26 4PK STRL BLUE (TOWEL DISPOSABLE) ×2 IMPLANT
TRAY FOLEY MTR SLVR 16FR STAT (SET/KITS/TRAYS/PACK) ×2 IMPLANT

## 2019-03-26 NOTE — Anesthesia Preprocedure Evaluation (Signed)
Anesthesia Evaluation  Patient identified by MRN, date of birth, ID band Patient awake    Reviewed: Allergy & Precautions, NPO status , Patient's Chart, lab work & pertinent test results  History of Anesthesia Complications Negative for: history of anesthetic complications  Airway Mallampati: II  TM Distance: >3 FB Neck ROM: Full    Dental no notable dental hx.    Pulmonary neg sleep apnea, COPD, former smoker,    breath sounds clear to auscultation- rhonchi (-) wheezing      Cardiovascular hypertension, Pt. on medications (-) CAD, (-) Past MI, (-) Cardiac Stents and (-) CABG  Rhythm:Regular Rate:Normal - Systolic murmurs and - Diastolic murmurs    Neuro/Psych neg Seizures negative neurological ROS  negative psych ROS   GI/Hepatic negative GI ROS, Neg liver ROS,   Endo/Other  neg diabetesHypothyroidism   Renal/GU negative Renal ROS     Musculoskeletal  (+) Arthritis ,   Abdominal (+) - obese,   Peds  Hematology negative hematology ROS (+)   Anesthesia Other Findings Past Medical History: No date: Carpal tunnel syndrome No date: COPD (chronic obstructive pulmonary disease) (HCC) No date: Dyspnea No date: Family history of adverse reaction to anesthesia     Comment:  sister hard time waking up No date: GCA (giant cell arteritis) ([place]) No date: H/O multiple pulmonary nodules No date: Hypertension No date: Hyponatremia No date: Hypothyroidism No date: Osteoarthritis No date: Seasonal allergies No date: Thyroid disease No date: Tingling in extremities   Reproductive/Obstetrics                             Anesthesia Physical Anesthesia Plan  ASA: II  Anesthesia Plan: Spinal   Post-op Pain Management:    Induction:   PONV Risk Score and Plan: 2 and Propofol infusion  Airway Management Planned: Natural Airway  Additional Equipment:   Intra-op Plan:   Post-operative Plan:    Informed Consent: I have reviewed the patients History and Physical, chart, labs and discussed the procedure including the risks, benefits and alternatives for the proposed anesthesia with the patient or authorized representative who has indicated his/her understanding and acceptance.     Dental advisory given  Plan Discussed with: CRNA and Anesthesiologist  Anesthesia Plan Comments:         Anesthesia Quick Evaluation

## 2019-03-26 NOTE — TOC Progression Note (Signed)
Transition of Care [facility]) - Progression Note    Patient Details  Name: Mary Barker MRN: [id number] Date of Birth: [birth year]  Transition of Care [facility] CM/SW [place], RN Phone Number: 03/26/2019, 9:04 AM  Clinical Narrative:      Requested the price of Lovenox, will notify the patient once obtained      Expected Discharge Plan and Services                                                 Social Determinants of Health (SDOH) Interventions    Readmission Risk Interventions No flowsheet data found.

## 2019-03-26 NOTE — Evaluation (Signed)
Physical Therapy Evaluation Patient Details Name: Mary Barker MRN: [id number] DOB: 08-09-1930 Today's Date: 03/26/2019   History of Present Illness  Patient is an 85 year old female admitted for R THA secondary to pain and AVN. PMH to includeL HTN, thyroid dz, OA, Carpal tunnel syndrome.  Clinical Impression  Patient received in bed. She reports she can move legs and has not pain. Agrees to PT evaluation. Patient performed bed mobility with modified independence, use of rails, increased time. Patient requires min guard assist to perform sit to stand transfer and min guard to walk with RW 60 feet. Patient will benefit from continued skilled PT to improve strength, independence and safety with mobility.        Follow Up Recommendations Home health PT    Equipment Recommendations       Recommendations for Other Services       Precautions / Restrictions Precautions Precautions: Anterior Hip Precaution Booklet Issued: No Restrictions Weight Bearing Restrictions: No RLE Weight Bearing: Weight bearing as tolerated      Mobility  Bed Mobility Overal bed mobility: Modified Independent             General bed mobility comments: increased time and effort, bed rails.  Transfers Overall transfer level: Needs assistance Equipment used: Rolling walker (2 wheeled) Transfers: Sit to/from Stand Sit to Stand: Min guard         General transfer comment: cues for safety, hand placement  Ambulation/Gait Ambulation/Gait assistance: Min guard Gait Distance (Feet): 60 Feet Assistive device: Rolling walker (2 wheeled) Gait Pattern/deviations: Step-to pattern;Decreased step length - left;Decreased step length - right Gait velocity: decreased   General Gait Details: demonstrates good balance, cues for safety  Stairs            Wheelchair Mobility    Modified Rankin (Stroke Patients Only)       Balance Overall balance assessment: Needs assistance Sitting-balance  support: Feet supported;Single extremity supported Sitting balance-Leahy Scale: Good     Standing balance support: Bilateral upper extremity supported Standing balance-Leahy Scale: Good                               Pertinent Vitals/Pain Pain Assessment: No/denies pain    Home Living Family/patient expects to be discharged to:: Private residence Living Arrangements: Alone Available Help at Discharge: Family Type of Home: Independent living facility Home Access: Level entry     Home Layout: One level Home Equipment: Environmental consultant - 4 wheels;Shower seat - built in;Toilet riser      Prior Function Level of Independence: Independent with assistive device(s)         Comments: patient was using rollator PTA, is active, drives.     Hand Dominance        Extremity/Trunk Assessment   Upper Extremity Assessment Upper Extremity Assessment: Overall WFL for tasks assessed    Lower Extremity Assessment Lower Extremity Assessment: Overall WFL for tasks assessed    Cervical / Trunk Assessment Cervical / Trunk Assessment: Normal  Communication   Communication: No difficulties  Cognition Arousal/Alertness: Awake/alert Behavior During Therapy: WFL for tasks assessed/performed Overall Cognitive Status: Within Functional Limits for tasks assessed                                        General Comments      Exercises  Assessment/Plan    PT Assessment Patient needs continued PT services  PT Problem List Decreased strength;Decreased mobility;Decreased safety awareness;Decreased activity tolerance;Decreased knowledge of precautions       PT Treatment Interventions DME instruction;Therapeutic activities;Gait training;Therapeutic exercise;Patient/family education;Functional mobility training    PT Goals (Current goals can be found in the Care Plan section)  Acute Rehab PT Goals Patient Stated Goal: to return home with Home health PT Goal  Formulation: With patient Time For Goal Achievement: 03/30/19 Potential to Achieve Goals: Good    Frequency BID   Barriers to discharge Decreased caregiver support      Co-evaluation               AM-PAC PT "6 Clicks" Mobility  Outcome Measure Help needed turning from your back to your side while in a flat bed without using bedrails?: A Little Help needed moving from lying on your back to sitting on the side of a flat bed without using bedrails?: A Little Help needed moving to and from a bed to a chair (including a wheelchair)?: A Little Help needed standing up from a chair using your arms (e.g., wheelchair or bedside chair)?: A Little Help needed to walk in hospital room?: A Little Help needed climbing 3-5 steps with a railing? : A Little 6 Click Score: 18    End of Session Equipment Utilized During Treatment: Gait belt Activity Tolerance: Patient tolerated treatment well Patient left: in chair;with chair alarm set;with call bell/phone within reach Nurse Communication: Mobility status PT Visit Diagnosis: Muscle weakness (generalized) (M62.81);Difficulty in walking, not elsewhere classified (R26.2)    Time: 1255-1330 PT Time Calculation (min) (ACUTE ONLY): 35 min   Charges:   PT Evaluation $PT Eval Low Complexity: 1 Low PT Treatments $Gait Training: 8-22 mins        Lily Kernen, PT, GCS 03/26/19,1:49 PM

## 2019-03-26 NOTE — H&P (Signed)
Chief Complaint   right hip pain  History of the Present Illness: Mary Barker is a 85 y.o. female here for follow-up of right hip arthritis. The patient had an MRI obtained on 01/25/2019 that showed delamination of the articular cartilage and femoral head, probably some element of avascular necrosis.  Her x-rays have shown early collapse she is having severe persistent pain and difficulty walking, affecting her activities of daily living.  She is being admitted today for right total hip replacement  She reports that she has been prednisone since late 08/2018 for giant cell arteritis.   The patient reports that she was golfing and walking 1 to 1.5 miles per day approximately 6 months ago. She has since stopped due to right hip pain. She is normally active. She adds that she has radiating pain to her left knee.   I have reviewed past medical, surgical, social and family history, and allergies as documented in the EMR.  Past Medical History: Past Medical History:  Diagnosis Date  . Allergic rhinitis due to allergen years ago  . Allergic state  . Breast cyst  benign breast cysts  . Carpal tunnel syndrome  . Carpal tunnel syndrome  . COPD (chronic obstructive pulmonary disease) (CMS-HCC)  . Hemorrhoids  . Hypertension  . Hyponatremia  . Hypothyroidism  . Osteoarthritis  . Pneumonia 12-11-2016  . Pulmonary nodules  . Seasonal allergies  . Sinusitis, unspecified  . Tingling in extremities   Past Surgical History: Past Surgical History:  Procedure Laterality Date  . APPENDECTOMY  12/11/2008  . arthroscopic knee surgery  2006-12-12  . CATARACT EXTRACTION  2006, 2009  . COLONOSCOPY  1998 and Dec 12, 2002  . DILATION AND CURETTAGE, DIAGNOSTIC / THERAPEUTIC  1962, 1975  . Schlusser  . Left temporal artery biopsy 07/2018  by isami sakai  . TONSILLECTOMY   Past Family History: Family History  Problem Relation Age of Onset  . Heart failure Mother  . High blood  pressure (Hypertension) Mother  . Myocardial Infarction (Heart attack) Father  . Myocardial Infarction (Heart attack) Brother  . High blood pressure (Hypertension) Brother  died 11-Dec-2001 prox  . Other Sister  multiple blood clots  . Cancer Sister  current  . High blood pressure (Hypertension) Sister  . High blood pressure (Hypertension) Brother   Medications: Current Outpatient Medications Ordered in Epic  Medication Sig Dispense Refill  . alendronate (FOSAMAX) 70 MG tablet Take 1 tablet (70 mg total) by mouth every 7 (seven) days Take with a full glass of water. Do not lie down for the next 30 min. 12 tablet 3  . antiox #8/om3/dha/epa/lut/zeax (PRESERVISION AREDS 2, OMEGA-3, ORAL) Take by mouth 2 (two) times daily  . aspirin 81 MG EC tablet Take 81 mg by mouth once daily.  Marland Kitchen atenolol (TENORMIN) 25 MG tablet Take 0.5 tablets (12.5 mg total) by mouth once daily (Patient taking differently: Take 25 mg by mouth every other day ) 45 tablet 3  . azelastine (ASTELIN) 137 mcg nasal spray Place 1 spray into both nostrils 2 (two) times daily  . calcium carbonate-vitamin D3 (CALCIUM 600 + D,3,) 600-125 mg-unit Tab Take 2 tablets by mouth once daily Patient is taking 600 mg 2 times daily  . fluticasone propionate (FLONASE) 50 mcg/actuation nasal spray USE 2 SPRAYS IN EACH NOSTRIL EVERY DAY 48 g 3  . levothyroxine (SYNTHROID) 125 MCG tablet TAKE 1 TAB BY MOUTH ONCE DAILY. TAKE ON AN EMPTY STOMACH WITH  A GLASSOF WATER ATLEAST 30-60 MINUTES BEFORE BREAKFAST 90 tablet 3  . losartan (COZAAR) 25 MG tablet Take 1 tablet (25 mg total) by mouth once daily 30 tablet 11  . montelukast (SINGULAIR) 10 mg tablet TAKE 1 TABLET BY MOUTH NIGHTLY 90 tablet 1  . multivitamin-lutein (CENTRUM SILVER) tablet Take 1 tablet by mouth once daily. 30 tablet 0  . predniSONE (DELTASONE) 5 MG tablet Take 3 tablets (15 mg total) by mouth once daily (Patient taking differently: Take 10 mg by mouth once daily ) 90 tablet 0  .  spironolactone (ALDACTONE) 25 MG tablet TAKE 1 TABLET BY MOUTH ONCE DAILY 90 tablet 2  . tocilizumab (ACTEMRA) 162 mg/0.9 mL subcutaneous inj syringe Inject 162 mg subcutaneously once Once a week 0.9 mL 0   No current Epic-ordered facility-administered medications on file.   Allergies: Allergies  Allergen Reactions  . Amlodipine Besylate (Bulk) Swelling  . Codeine Sulfate Nausea  . Mobic [Meloxicam] Nausea  . Tramadol Nausea    Body mass index is 26.01 kg/m.  Review of Systems: A comprehensive 14 point ROS was performed, reviewed, and the pertinent orthopaedic findings are documented in the HPI.  Vitals:  02/15/19 1047  BP: 162/84   General Physical Examination:  General/Constitutional: No apparent distress: well-nourished and well developed. Eyes: Pupils equal, round with synchronous movement. Lungs: Clear to auscultation HEENT: Normal Vascular: No edema, swelling or tenderness, except as noted in detailed exam. Cardiac: Heart rate and rhythm is regular. Integumentary: No impressive skin lesions present, except as noted in detailed exam. Neuro/Psych: Normal mood and affect, oriented to person, place and time.  Musculoskeletal Examination: Lungs are clear. Heart rate and rhythm is normal. HEENT is normal.  Radiographs: The right hip MRI was reviewed with her and her family today as noted above.   Assessment: ICD-10-CM  1. Drug-induced osteonecrosis of right femur (CMS-HCC) M87.151   Plan: The patient has clinical findings of avascular necrosis of the right hip secondary to prednisone usage.    She is being admitted today for right total hip replacement.  Surgical Risks:  The nature of the condition and the proposed procedure has been reviewed in detail with the patient. Surgical versus non-surgical options and prognosis for recovery have been reviewed and the inherent risks and benefits of each have been discussed including the risks of infection, bleeding, injury  to nerves / blood vessels / tendons, incomplete relief of symptoms, persisting pain and / or stiffness, loss of function, complex regional pain syndrome, failure of procedure, as appropriate.

## 2019-03-26 NOTE — Transfer of Care (Signed)
Immediate Anesthesia Transfer of Care Note  Patient: Mary Barker  Procedure(s) Performed: RIGHT TOTAL HIP ARTHROPLASTY (Right )  Patient Location: PACU  Anesthesia Type:General and Spinal  Level of Consciousness: awake, alert , oriented and patient cooperative  Airway & Oxygen Therapy: Patient Spontanous Breathing and Patient connected to face mask  Post-op Assessment: Report given to RN and Post -op Vital signs reviewed and stable  Post vital signs: Reviewed and stable  Last Vitals:  Vitals Value Taken Time  BP 106/55 03/26/19 0849  Temp 36.7 C 03/26/19 0849  Pulse 64 03/26/19 0855  Resp 19 03/26/19 0855  SpO2 96 % 03/26/19 0855  Vitals shown include unvalidated device data.  Last Pain:  Vitals:   03/26/19 0612  TempSrc: Oral  PainSc: 3          Complications: No apparent anesthesia complications

## 2019-03-26 NOTE — Anesthesia Post-op Follow-up Note (Signed)
Anesthesia QCDR form completed.        

## 2019-03-26 NOTE — Op Note (Signed)
03/26/2019  8:54 AM  PATIENT:  Mary Barker  85 y.o. female  PRE-OPERATIVE DIAGNOSIS:  avascular necrosis right hip  POST-OPERATIVE DIAGNOSIS:  avascular necrosis right hip  PROCEDURE:  Procedure(s): RIGHT TOTAL HIP ARTHROPLASTY (Right)  SURGEON: Laurene Footman, MD  ASSISTANTS: None  ANESTHESIA:   spinal  EBL:  Total I/O In: 500 [I.V.:500] Out: 300 [Blood:300]  BLOOD ADMINISTERED:none  DRAINS: none   LOCAL MEDICATIONS USED:  MARCAINE    and OTHER Exparel  SPECIMEN:  Source of Specimen:  Right femoral head  DISPOSITION OF SPECIMEN:  PATHOLOGY  COUNTS:  YES  TOURNIQUET:  * No tourniquets in log *  IMPLANTS: Medacta AMIS 2 standard stem, 48 mm Mpact DM cup and liner with metal S 28 mm head  DICTATION: .Dragon Dictation   The patient was brought to the operating room and after spinal anesthesia was obtained patient was placed on the operative table with the ipsilateral foot into the Medacta attachment, contralateral leg on a well-padded table. C-arm was brought in and preop template x-ray taken. After prepping and draping in usual sterile fashion appropriate patient identification and timeout procedures were completed. Anterior approach to the hip was obtained and centered over the greater trochanter and TFL muscle. The subcutaneous tissue was incised hemostasis being achieved by electrocautery. TFL fascia was incised and the muscle retracted laterally deep retractor placed. The lateral femoral circumflex vessels were identified and ligated. The anterior capsule was exposed and a capsulotomy performed. The neck was identified and a femoral neck cut carried out with a saw. The head was removed without difficulty and showed sclerotic femoral head and acetabulum. Reaming was carried out to 46 mm and a 48 mm cup trial gave appropriate tightness to the acetabular component a 48 DM cup was impacted into position. The leg was then externally rotated and ischiofemoral and pubofemoral  releases carried out. The femur was sequentially broached to a size 2, size 2 standard with as had trials were placed and the final components chosen. The 2 standard stem was inserted along with a metal S 28 mm head and 48 mm liner. The hip was reduced and was stable the wound was thoroughly irrigated with fibrillar placed along the posterior capsule and medial neck. The deep fascia ws closed using a heavy Quill after infiltration of 30 cc of quarter percent Sensorcaine with epinephrine, Exparel being injected throughout the case.  Marland Kitchen3-0 V-loc to close the skin with skin staples.  Incisional wound VAC applied patient was sent to recovery in stable condition.   PLAN OF CARE: Admit to inpatient

## 2019-03-26 NOTE — Anesthesia Procedure Notes (Signed)
Spinal  Patient location during procedure: OR Start time: 03/26/2019 7:20 AM End time: 03/26/2019 7:25 AM Staffing Anesthesiologist: Emmie Niemann, MD Performed: anesthesiologist  Preanesthetic Checklist Completed: patient identified, site marked, surgical consent, pre-op evaluation, timeout performed, IV checked, risks and benefits discussed and monitors and equipment checked Spinal Block Patient position: sitting Prep: ChloraPrep Patient monitoring: heart rate, cardiac monitor, continuous pulse ox and blood pressure Approach: midline Location: L3-4 Injection technique: single-shot Needle Needle type: Pencil-Tip and Introducer  Needle gauge: 24 G Needle length: 9 cm Assessment Sensory level: T4

## 2019-03-26 NOTE — TOC Benefit Eligibility Note (Signed)
Transition of Care [facility]) Benefit Eligibility Note    Patient Details  Name: Mary Barker MRN: [id number] Date of Birth: [birth year]   Medication/Dose: Enoxaparin 40mg  once daily for 14 days  Covered?: Yes  Tier: (Tier 4)  Prescription Coverage Preferred Pharmacy: [facility] with Person/Company/Phone Number:: Amelio with [employer]on Passey at [phone]  Co-Pay: $90 estimated copay  Prior Approval: No  Deductible: (No deductible on plan per rep.)   Dannette Barbara  Phone Number: [phone] or [phone] 03/26/2019, 10:01 AM

## 2019-03-27 LAB — BPAM RBC
Blood Product Expiration Date: 202008092359
Blood Product Expiration Date: 202008092359
Unit Type and Rh: 600
Unit Type and Rh: 600

## 2019-03-27 LAB — CBC
HCT: 32.5 % — ABNORMAL LOW (ref 36.0–46.0)
Hemoglobin: 10.6 g/dL — ABNORMAL LOW (ref 12.0–15.0)
MCH: 32.4 pg (ref 26.0–34.0)
MCHC: 32.6 g/dL (ref 30.0–36.0)
MCV: 99.4 fL (ref 80.0–100.0)
Platelets: 160 10*3/uL (ref 150–400)
RBC: 3.27 MIL/uL — ABNORMAL LOW (ref 3.87–5.11)
RDW: 13.8 % (ref 11.5–15.5)
WBC: 11.5 10*3/uL — ABNORMAL HIGH (ref 4.0–10.5)
nRBC: 0 % (ref 0.0–0.2)

## 2019-03-27 LAB — TYPE AND SCREEN
ABO/RH(D): A NEG
Antibody Screen: POSITIVE
Unit division: 0
Unit division: 0

## 2019-03-27 LAB — BASIC METABOLIC PANEL
Anion gap: 7 (ref 5–15)
BUN: 22 mg/dL (ref 8–23)
CO2: 23 mmol/L (ref 22–32)
Calcium: 8 mg/dL — ABNORMAL LOW (ref 8.9–10.3)
Chloride: 109 mmol/L (ref 98–111)
Creatinine, Ser: 0.74 mg/dL (ref 0.44–1.00)
GFR calc Af Amer: >60 mL/min (ref >60)
GFR calc non Af Amer: >60 mL/min (ref >60)
Glucose, Bld: 131 mg/dL — ABNORMAL HIGH (ref 70–99)
Potassium: 4.1 mmol/L (ref 3.5–5.1)
Sodium: 139 mmol/L (ref 135–145)

## 2019-03-27 MED ORDER — FE FUMARATE-B12-VIT C-FA-IFC PO CAPS
1.0000 | ORAL_CAPSULE | Freq: Two times a day (BID) | ORAL | Status: DC
  Administered 2019-03-27 – 2019-03-28 (×3): 1 via ORAL
  Filled 2019-03-27 (×4): qty 1

## 2019-03-27 NOTE — Progress Notes (Signed)
Physical Therapy Treatment Patient Details Name: Mary Barker MRN: [id number] DOB: Apr 07, 1930 Today's Date: 03/27/2019    History of Present Illness Patient is an 85 year old female admitted for R THA secondary to pain and AVN. PMH to includeL HTN, thyroid dz, OA, Carpal tunnel syndrome.    PT Comments    Pt is making good progress towards goals. Pt very anxious and asks a lot of questions regarding DME and progression of therapy at home. Discussed extensively on what to expect after discharge. Good endurance with there-ex and ambulation. Will need RW for home discharge.  Follow Up Recommendations  Home health PT     Equipment Recommendations  Rolling walker with 5" wheels    Recommendations for Other Services       Precautions / Restrictions Precautions Precautions: Anterior Hip Precaution Booklet Issued: Yes (comment) Restrictions Weight Bearing Restrictions: Yes RLE Weight Bearing: Weight bearing as tolerated    Mobility  Bed Mobility Overal bed mobility: Modified Independent             General bed mobility comments: safe technique, slightly impulsive  Transfers Overall transfer level: Needs assistance Equipment used: Rolling walker (2 wheeled) Transfers: Sit to/from Stand Sit to Stand: Supervision         General transfer comment: safe technique  Ambulation/Gait Ambulation/Gait assistance: Min guard Gait Distance (Feet): 200 Feet Assistive device: Rolling walker (2 wheeled) Gait Pattern/deviations: Step-through pattern;Decreased step length - right;Decreased step length - left Gait velocity: 10' in 10 seconds   General Gait Details: safe technique with occasional cues to keep close to RW. Able to carry conversation during ambulation.   Stairs             Wheelchair Mobility    Modified Rankin (Stroke Patients Only)       Balance Overall balance assessment: Needs assistance Sitting-balance support: Feet supported Sitting  balance-Leahy Scale: Good     Standing balance support: Bilateral upper extremity supported Standing balance-Leahy Scale: Good                              Cognition Arousal/Alertness: Awake/alert Behavior During Therapy: WFL for tasks assessed/performed Overall Cognitive Status: Within Functional Limits for tasks assessed                                        Exercises Other Exercises Other Exercises: supine ther-ex performed on R LE including AP, quad sets, glut sets, hip abd/add, LAQ, and SAQ. All ther-ex performed x 10 reps with cga and cues for technique. Written HEP reviewed.    General Comments        Pertinent Vitals/Pain Pain Assessment: 0-10 Pain Score: 4  Pain Location: R hip/knee Pain Descriptors / Indicators: Operative site guarding;Discomfort;Dull Pain Intervention(s): Limited activity within patient's tolerance;Ice applied    Home Living                      Prior Function            PT Goals (current goals can now be found in the care plan section) Acute Rehab PT Goals Patient Stated Goal: to return home with Home health PT Goal Formulation: With patient Time For Goal Achievement: 03/30/19 Potential to Achieve Goals: Good Progress towards PT goals: Progressing toward goals    Frequency    BID  PT Plan Current plan remains appropriate    Co-evaluation              AM-PAC PT "6 Clicks" Mobility   Outcome Measure  Help needed turning from your back to your side while in a flat bed without using bedrails?: None Help needed moving from lying on your back to sitting on the side of a flat bed without using bedrails?: None Help needed moving to and from a bed to a chair (including a wheelchair)?: A Little Help needed standing up from a chair using your arms (e.g., wheelchair or bedside chair)?: A Little Help needed to walk in hospital room?: A Little Help needed climbing 3-5 steps with a railing? :  A Little 6 Click Score: 20    End of Session Equipment Utilized During Treatment: Gait belt Activity Tolerance: Patient tolerated treatment well Patient left: in bed;with bed alarm set;with SCD's reapplied(left with aide in room) Nurse Communication: Mobility status PT Visit Diagnosis: Muscle weakness (generalized) (M62.81);Difficulty in walking, not elsewhere classified (R26.2)     Time: 1460-4799 PT Time Calculation (min) (ACUTE ONLY): 38 min  Charges:  $Gait Training: 23-37 mins $Therapeutic Exercise: 8-22 mins                     Greggory Stallion, PT, DPT [phone]     [name] 03/27/2019, 2:50 PM

## 2019-03-27 NOTE — Evaluation (Signed)
Occupational Therapy Evaluation Patient Details Name: Mary Barker MRN: [id number] DOB: [birth year] Today's Date: 03/27/2019    History of Present Illness Patient is an 85 year old female admitted for R THA secondary to pain and AVN. PMH to includeL HTN, thyroid dz, OA, Carpal tunnel syndrome.   Clinical Impression   Mary Barker" [name] was seen for OT evaluation this date, POD#1 from above surgery. Pt was independent in all ADLs prior to surgery, however occasionally using 4WW for mobility due to R hip pain. Pt is eager to return to PLOF with less pain and improved safety and independence. Pt currently requires minimal assist for LB dressing and bathing while in seated position due to pain and limited AROM of R hip. Pt instructed in self care skills, falls prevention strategies, home/routines modifications, DME/AE for LB bathing and dressing tasks, and compression stocking mgt strategies. Pt return demonstrated use of the reacher and sock-aid to doff/don socks on this date with min VCs from this therapist for technique. Pt is eager to learn about AE to improve her independence and safety upon return home. Pt would benefit from additional instruction in self care skills and techniques to help maintain precautions with or without assistive devices to support recall and carryover prior to discharge. Recommend HHOT upon discharge.      Follow Up Recommendations  Home health OT    Equipment Recommendations  None recommended by OT    Recommendations for Other Services       Precautions / Restrictions Precautions Precautions: Anterior Hip Precaution Booklet Issued: No Restrictions Weight Bearing Restrictions: Yes RLE Weight Bearing: Weight bearing as tolerated      Mobility Bed Mobility Overal bed mobility: Modified Independent             General bed mobility comments: safe technique, slightly impulsive  Transfers Overall transfer level: Needs assistance Equipment used:  Rolling walker (2 wheeled) Transfers: Sit to/from Stand Sit to Stand: Supervision         General transfer comment: safe technique    Balance Overall balance assessment: Needs assistance Sitting-balance support: Feet supported Sitting balance-Leahy Scale: Good     Standing balance support: Bilateral upper extremity supported Standing balance-Leahy Scale: Good                             ADL either performed or assessed with clinical judgement   ADL Overall ADL's : Needs assistance/impaired                                       General ADL Comments: Pt doing very well overall. Requires min to mod assist for LB ADL tasks in seated position. Return demonstrated use of sock-aid and LHR with min VCs for technique on this date. Eager to return to [facility] and get back to golfing.     Vision Baseline Vision/History: Wears glasses Wears Glasses: At all times Patient Visual Report: No change from baseline       Perception     Praxis      Pertinent Vitals/Pain Pain Assessment: 0-10 Pain Score: 3  Pain Location: R hip/knee Pain Descriptors / Indicators: Operative site guarding;Discomfort;Dull Pain Intervention(s): Limited activity within patient's tolerance;Monitored during session;Repositioned     Hand Dominance Right   Extremity/Trunk Assessment Upper Extremity Assessment Upper Extremity Assessment: Overall WFL for tasks assessed   Lower Extremity  Assessment Lower Extremity Assessment: RLE deficits/detail RLE Deficits / Details: s/p R THA RLE Coordination: decreased fine motor;decreased gross motor   Cervical / Trunk Assessment Cervical / Trunk Assessment: Normal   Communication Communication Communication: No difficulties   Cognition Arousal/Alertness: Awake/alert Behavior During Therapy: WFL for tasks assessed/performed Overall Cognitive Status: Within Functional Limits for tasks assessed                                      General Comments       Exercises Exercises: Other exercises Other Exercises Other Exercises: supine ther-ex performed on R LE including AP, quad sets, glut sets, hip abd/add, LAQ, and SAQ. All ther-ex performed x 10 reps with cga and cues for technique. Written HEP reviewed. Other Exercises: Pt educated on falls prevention strategies, safe use of AE for ADL/functional mobility, considerations for anterior hip precautions, as well as compression stocking mgt on this date.   Shoulder Instructions      Home Living Family/patient expects to be discharged to:: Private residence(Independent Living) Living Arrangements: Alone Available Help at Discharge: Family;Available 24 hours/day(DIL coming to stay with pt during recovery.) Type of Home: Independent living facility Home Access: Level entry     Home Layout: One level     Bathroom Shower/Tub: Walk-in shower;Tub/shower unit   Bathroom Toilet: Handicapped height Bathroom Accessibility: Yes   Home Equipment: Walker - 4 wheels;Shower seat - built in;Toilet riser          Prior Functioning/Environment Level of Independence: Independent with assistive device(s)        Comments: patient was using rollator PTA, is active, drives.        OT Problem List: Decreased strength;Decreased coordination;Decreased range of motion;Decreased activity tolerance;Decreased safety awareness;Impaired balance (sitting and/or standing);Decreased knowledge of use of DME or AE;Decreased knowledge of precautions      OT Treatment/Interventions: Self-care/ADL training;Balance training;Therapeutic exercise;Therapeutic activities;DME and/or AE instruction;Patient/family education    OT Goals(Current goals can be found in the care plan section) Acute Rehab OT Goals Patient Stated Goal: To get back to golfing again OT Goal Formulation: With patient Time For Goal Achievement: 04/10/19 Potential to Achieve Goals: Good ADL Goals Pt Will Perform  Lower Body Dressing: with min assist;sit to/from stand(With LRAD PRN for improved safety and functional independence.) Pt Will Transfer to Toilet: ambulating;regular height toilet;with modified independence(With LRAD PRN for improved safety and functional independence.) Pt Will Perform Toileting - Clothing Manipulation and hygiene: sit to/from stand;with modified independence;with adaptive equipment(With LRAD PRN for improved safety and functional independence.)  OT Frequency: Min 1X/week   Barriers to D/C:            Co-evaluation              AM-PAC OT "6 Clicks" Daily Activity     Outcome Measure Help from another person eating meals?: None Help from another person taking care of personal grooming?: None Help from another person toileting, which includes using toliet, bedpan, or urinal?: A Little Help from another person bathing (including washing, rinsing, drying)?: A Little Help from another person to put on and taking off regular upper body clothing?: None Help from another person to put on and taking off regular lower body clothing?: A Little 6 Click Score: 21   End of Session Equipment Utilized During Treatment: Gait belt;Rolling walker  Activity Tolerance: Patient tolerated treatment well Patient left: in bed;with call bell/phone within reach;with  bed alarm set;with SCD's reapplied  OT Visit Diagnosis: Other abnormalities of gait and mobility (R26.89);Pain Pain - Right/Left: Right Pain - part of body: Hip;Knee                Time: 4445-8483 OT Time Calculation (min): 41 min Charges:  OT General Charges $OT Visit: 1 Visit OT Evaluation $OT Eval Low Complexity: 1 Low OT Treatments $Self Care/Home Management : 23-37 mins  Shara Blazing, M.S., OTR/L Ascom: [phone] 03/27/19, 3:57 PM

## 2019-03-27 NOTE — Progress Notes (Signed)
OT Cancellation Note  Patient Details Name: [name] MRN: [id number] DOB: 1929-10-01   Cancelled Treatment:    Reason Eval/Treat Not Completed: Other (comment). Order received and chart reviewed. Upon arrival to room, CNA in room with pt completing pt care Will re-attempt at a later time/date as available and pt medically appropriate for OT TX.   Shara Blazing, M.S., OTR/L Ascom: [phone] 03/27/19, 1:57 PM

## 2019-03-27 NOTE — Anesthesia Postprocedure Evaluation (Signed)
Anesthesia Post Note  Patient: Mary Barker  Procedure(s) Performed: RIGHT TOTAL HIP ARTHROPLASTY (Right )  Patient location during evaluation: Nursing Unit Anesthesia Type: Spinal Level of consciousness: awake, awake and alert and oriented Pain management: pain level controlled Vital Signs Assessment: post-procedure vital signs reviewed and stable Respiratory status: spontaneous breathing, nonlabored ventilation and respiratory function stable Cardiovascular status: blood pressure returned to baseline and stable Postop Assessment: no headache and no backache Anesthetic complications: no     Last Vitals:  Vitals:   03/26/19 1935 03/26/19 2259  BP: 116/68 140/61  Pulse: 63 65  Resp: 18 18  Temp: 36.4 C (!) 36.4 C  SpO2: 94% 94%    Last Pain:  Vitals:   03/26/19 2259  TempSrc: Oral  PainSc:                  Johnna Acosta

## 2019-03-27 NOTE — Progress Notes (Signed)
IV leaked mid shift. PA made aware, Pt does not want another IV and is progressing well. Per PA it is okay for pt to not have an IV at this time. IV solumedrol will not be given. Per pt knee pain has improved.

## 2019-03-27 NOTE — TOC Initial Note (Signed)
Transition of Care [facility]) - Initial/Assessment Note    Patient Details  Name: Mary Barker MRN: [id number] Date of Birth: October 15, 1929  [facility]) CM/SW Contact:    Su Hilt, RN Phone Number: 03/27/2019, 3:58 PM  Clinical Narrative:                 Met with the patient to discuss DC plan and needs She lives in independent apartments at [facility] Her daughter in law is coming to stay with her and help her He daughter in law will provide transportation The patient wants to use [facility] and OT as her friends use them She goes to [facility] pharmacy and can afford medication She has DME at home but needs a RW, notified Brad at [employer] No further needs   Expected Discharge Plan: [place] Services Barriers to Discharge: Barriers Resolved   Patient Goals and CMS Choice   CMS Medicare.gov Compare Post Acute Care list provided to:: Patient Choice offered to / list presented to : Patient  Expected Discharge Plan and Services Expected Discharge Plan: [place]   Discharge Planning Services: CM Consult Post Acute Care Choice: [place] arrangements for the past 2 months: Apartment                 DME Arranged: Walker rolling DME Agency: [employer] Date DME Agency Contacted: 03/27/19 Time DME Agency Contacted: [phone] Representative spoke with at DME Agency: [place]: PT, OT [place] Agency: [facility] [employer] Date [place]: 03/27/19 Time [place]: [place] Representative spoke with at [place]: [place] Arrangements/Services Living arrangements for the past 2 months: [place] with:: Self Patient language and need for interpreter reviewed:: No Do you feel safe going back to the place where you live?: Yes      Need for Family Participation in Patient Care: No (Comment) Care giver support system in place?: Yes  (comment) Current home services: DME(rollator , raised toilet, shower chair) Criminal Activity/Legal Involvement Pertinent to Current Situation/Hospitalization: No - Comment as needed  Activities of Daily Living Home Assistive Devices/Equipment: Cane (specify quad or straight), Walker (specify type) ADL Screening (condition at time of admission) Patient's cognitive ability adequate to safely complete daily activities?: Yes Is the patient deaf or have difficulty hearing?: Yes Does the patient have difficulty seeing, even when wearing glasses/contacts?: No Does the patient have difficulty concentrating, remembering, or making decisions?: No Patient able to express need for assistance with ADLs?: Yes Does the patient have difficulty dressing or bathing?: No Independently performs ADLs?: Yes (appropriate for developmental age) Does the patient have difficulty walking or climbing stairs?: Yes Weakness of Legs: Right Weakness of Arms/Hands: None  Permission Sought/Granted   Permission granted to share information with : Yes, Verbal Permission Granted              Emotional Assessment Appearance:: Appears stated age Attitude/Demeanor/Rapport: Engaged Affect (typically observed): Accepting, Happy, Hopeful Orientation: : Oriented to Self, Oriented to Place, Oriented to  Time, Oriented to Situation Alcohol / Substance Use: Not Applicable Psych Involvement: No (comment)  Admission diagnosis:  PRIMARY OSTEOARTHRITIS OF RIGHT HIP Patient Active Problem List   Diagnosis Date Noted  . Status post total hip replacement, right 03/26/2019  . Temporal arteritis ([place]) 09/03/2018  . Leukocytosis 08/12/2018  . Pneumonia 12/04/2016   PCP:  Maryland Pink, MD Pharmacy:   [facility]  [place], [place] [address] [place] [address] Phone: [phone] Fax: [phone]  [place] [address] [place]  [place] [address] Phone: [phone] Fax: [phone]     Social Determinants of Health (SDOH) Interventions    Readmission Risk Interventions No flowsheet data found.

## 2019-03-27 NOTE — Progress Notes (Signed)
Physical Therapy Treatment Patient Details Name: Mary Barker MRN: [id number] DOB: [birth year] Today's Date: 03/27/2019    History of Present Illness Patient is an 85 year old female admitted for R THA secondary to pain and AVN. PMH to includeL HTN, thyroid dz, OA, Carpal tunnel syndrome.    PT Comments    Pt is making good progress towards goals with improved ambulation distance this session. Able to navigate RN station and perform supine there-ex. Very motivated to perform therapy. Will bring and review written HEP this PM. Continue to progress.   Follow Up Recommendations  Home health PT     Equipment Recommendations  None recommended by PT    Recommendations for Other Services       Precautions / Restrictions Precautions Precautions: Anterior Hip Precaution Booklet Issued: No Restrictions Weight Bearing Restrictions: Yes RLE Weight Bearing: Weight bearing as tolerated    Mobility  Bed Mobility Overal bed mobility: Modified Independent             General bed mobility comments: able to swing legs off bed in sync. Once seated, upright posture noted. Use of bed rails  Transfers Overall transfer level: Needs assistance Equipment used: Rolling walker (2 wheeled) Transfers: Sit to/from Stand Sit to Stand: Supervision         General transfer comment: safe technique  Ambulation/Gait Ambulation/Gait assistance: Min guard Gait Distance (Feet): 200 Feet Assistive device: Rolling walker (2 wheeled) Gait Pattern/deviations: Step-through pattern;Decreased step length - right;Decreased step length - left Gait velocity: 10' in 10 seconds   General Gait Details: safe technique with cues for reciprocal gait pattern. Upright posture. Symmetrical steps   Stairs             Wheelchair Mobility    Modified Rankin (Stroke Patients Only)       Balance Overall balance assessment: Needs assistance Sitting-balance support: Feet supported Sitting  balance-Leahy Scale: Good     Standing balance support: Bilateral upper extremity supported Standing balance-Leahy Scale: Good                              Cognition Arousal/Alertness: Awake/alert Behavior During Therapy: WFL for tasks assessed/performed Overall Cognitive Status: Within Functional Limits for tasks assessed                                        Exercises Other Exercises Other Exercises: supine ther-ex performed on R LE including AP, quad sets, glut sets, hip abd/add, and SAQ. All ther-ex performed x 10 reps with cga and cues for technique    General Comments        Pertinent Vitals/Pain Pain Assessment: 0-10 Pain Score: 4  Pain Location: R hip/knee Pain Descriptors / Indicators: Operative site guarding;Discomfort;Dull Pain Intervention(s): Limited activity within patient's tolerance;Premedicated before session;Repositioned    Home Living                      Prior Function            PT Goals (current goals can now be found in the care plan section) Acute Rehab PT Goals Patient Stated Goal: to return home with Home health PT Goal Formulation: With patient Time For Goal Achievement: 03/30/19 Potential to Achieve Goals: Good Progress towards PT goals: Progressing toward goals    Frequency    BID  PT Plan Current plan remains appropriate    Co-evaluation              AM-PAC PT "6 Clicks" Mobility   Outcome Measure  Help needed turning from your back to your side while in a flat bed without using bedrails?: None Help needed moving from lying on your back to sitting on the side of a flat bed without using bedrails?: None Help needed moving to and from a bed to a chair (including a wheelchair)?: A Little Help needed standing up from a chair using your arms (e.g., wheelchair or bedside chair)?: A Little Help needed to walk in hospital room?: A Little Help needed climbing 3-5 steps with a railing? :  A Little 6 Click Score: 20    End of Session Equipment Utilized During Treatment: Gait belt Activity Tolerance: Patient tolerated treatment well Patient left: in chair;with chair alarm set;with call bell/phone within reach Nurse Communication: Mobility status PT Visit Diagnosis: Muscle weakness (generalized) (M62.81);Difficulty in walking, not elsewhere classified (R26.2)     Time: 4034-7425 PT Time Calculation (min) (ACUTE ONLY): 31 min  Charges:  $Gait Training: 8-22 mins $Therapeutic Exercise: 8-22 mins                     Greggory Stallion, PT, DPT [phone]    Precious Segall 03/27/2019, 12:08 PM

## 2019-03-27 NOTE — Progress Notes (Signed)
   Subjective: 1 Day Post-Op Procedure(s) (LRB): RIGHT TOTAL HIP ARTHROPLASTY (Right) Patient reports pain as mild.  Patient complaining of right knee pain, received cortisone injection 2 weeks ago with very little relief.  Knee pain aggravated with PT yesterday. Patient is well, and has had no acute complaints or problems Denies any CP, SOB, ABD pain. We will continue therapy today.  Plan is to go Home after hospital stay.  Objective: Vital signs in last 24 hours: Temp:  [97 F (36.1 C)-98.1 F (36.7 C)] 97.5 F (36.4 C) (07/21 2259) Pulse Rate:  [57-67] 65 (07/21 2259) Resp:  [14-22] 18 (07/21 2259) BP: (106-152)/(55-72) 140/61 (07/21 2259) SpO2:  [94 %-100 %] 94 % (07/21 2259)  Intake/Output from previous day: 07/21 0701 - 07/22 0700 In: 1164.7 [P.O.:237; I.V.:912.5; IV Piggyback:15.2] Out: 1180 [Urine:880; Blood:300] Intake/Output this shift: No intake/output data recorded.  Recent Labs    03/26/19 1111 03/27/19 0326  HGB 12.9 10.6*   Recent Labs    03/26/19 1111 03/27/19 0326  WBC 12.0* 11.5*  RBC 3.97 3.27*  HCT 40.2 32.5*  PLT 172 160   Recent Labs    03/26/19 1111 03/27/19 0326  NA  --  139  K  --  4.1  CL  --  109  CO2  --  23  BUN  --  22  CREATININE 0.79 0.74  GLUCOSE  --  131*  CALCIUM  --  8.0*   No results for input(s): LABPT, INR in the last 72 hours.  EXAM General - Patient is Alert, Appropriate and Oriented Extremity - Neurovascular intact Sensation intact distally Intact pulses distally Dorsiflexion/Plantar flexion intact No cellulitis present Compartment soft Dressing - dressing C/D/I and no drainage, Praveena intact without drainage Motor Function - intact, moving foot and toes well on exam.   Past Medical History:  Diagnosis Date  . Carpal tunnel syndrome   . COPD (chronic obstructive pulmonary disease) ([place])   . Dyspnea   . Family history of adverse reaction to anesthesia    sister hard time waking up  . GCA (giant cell  arteritis) ([place])   . H/O multiple pulmonary nodules   . Hypertension   . Hyponatremia   . Hypothyroidism   . Osteoarthritis   . Seasonal allergies   . Thyroid disease   . Tingling in extremities     Assessment/Plan:   1 Day Post-Op Procedure(s) (LRB): RIGHT TOTAL HIP ARTHROPLASTY (Right) Active Problems:   Status post total hip replacement, right  Estimated body mass index is 24 kg/m as calculated from the following:   Height as of this encounter: 4\' 11"  (1.499 m).   Weight as of this encounter: 53.9 kg. Advance diet Up with therapy  Needs bowel movement Acute post op blood loss anemia -hemoglobin 10.6.  We will start iron supplement.  Recheck labs in the morning Vital signs are stable Pain well controlled Care management to assist with discharge to home with home health PT  DVT Prophylaxis - Lovenox, TED hose and SCDs Weight-Bearing as tolerated to right leg   T. Rachelle Hora, PA-C [place] 03/27/2019, 7:48 AM

## 2019-03-28 LAB — CBC
HCT: 29.5 % — ABNORMAL LOW (ref 36.0–46.0)
Hemoglobin: 9.6 g/dL — ABNORMAL LOW (ref 12.0–15.0)
MCH: 32.5 pg (ref 26.0–34.0)
MCHC: 32.5 g/dL (ref 30.0–36.0)
MCV: 100 fL (ref 80.0–100.0)
Platelets: 158 10*3/uL (ref 150–400)
RBC: 2.95 MIL/uL — ABNORMAL LOW (ref 3.87–5.11)
RDW: 14.1 % (ref 11.5–15.5)
WBC: 12.9 10*3/uL — ABNORMAL HIGH (ref 4.0–10.5)
nRBC: 0 % (ref 0.0–0.2)

## 2019-03-28 LAB — BASIC METABOLIC PANEL
Anion gap: 3 — ABNORMAL LOW (ref 5–15)
BUN: 26 mg/dL — ABNORMAL HIGH (ref 8–23)
CO2: 28 mmol/L (ref 22–32)
Calcium: 8.3 mg/dL — ABNORMAL LOW (ref 8.9–10.3)
Chloride: 105 mmol/L (ref 98–111)
Creatinine, Ser: 0.81 mg/dL (ref 0.44–1.00)
GFR calc Af Amer: >60 mL/min (ref >60)
GFR calc non Af Amer: >60 mL/min (ref >60)
Glucose, Bld: 101 mg/dL — ABNORMAL HIGH (ref 70–99)
Potassium: 4.1 mmol/L (ref 3.5–5.1)
Sodium: 136 mmol/L (ref 135–145)

## 2019-03-28 LAB — SURGICAL PATHOLOGY

## 2019-03-28 MED ORDER — ACETAMINOPHEN 325 MG PO TABS: 325.0000 mg | ORAL_TABLET | Freq: Four times a day (QID) | ORAL | Status: DC | PRN

## 2019-03-28 MED ORDER — TRAMADOL HCL 50 MG PO TABS: 50.0000 mg | ORAL_TABLET | Freq: Four times a day (QID) | ORAL | 0 refills | Status: DC

## 2019-03-28 MED ORDER — ENOXAPARIN SODIUM 40 MG/0.4ML ~~LOC~~ SOLN: 40.0000 mg | SUBCUTANEOUS | 0 refills | Status: DC

## 2019-03-28 NOTE — Progress Notes (Signed)
Patient ready for discharge. All belongings with patient . Discharge instructions, meds, follow up appts, s/sx of infection reviewed.  Patient displays no s/sx of distress. Caregiver will provided transportation to home.

## 2019-03-28 NOTE — TOC Transition Note (Signed)
Transition of Care [facility]) - CM/SW Discharge Note   Patient Details  Name: Mary Barker MRN: [id number] Date of Birth: 10/28/1929  [facility] CM/SW Contact:  Su Hilt, RN Phone Number: 03/28/2019, 8:47 AM   Clinical Narrative:     Patient to dc home today Her daughter in law will be staying with her Her Lovenox is 90$ and she states she can afford her medication RW in the room [facility] set up for PT and OT No further needs  Final next level of care: Home w Home Health Services Barriers to Discharge: Barriers Resolved   Patient Goals and CMS Choice   CMS Medicare.gov Compare Post Acute Care list provided to:: Patient Choice offered to / list presented to : Patient  Discharge Placement                       Discharge Plan and Services   Discharge Planning Services: CM Consult Post Acute Care Choice: Home Health          DME Arranged: Walker rolling DME Agency: [employer] Date DME Agency Contacted: 03/27/19 Time DME Agency Contacted: [phone] Representative spoke with at DME Agency: [place]: PT, OT [place] Agency: [facility] [employer] Date [place]: 03/27/19 Time [place]: [place] Representative spoke with at [place]: [place] ([place]) Interventions     Readmission Risk Interventions No flowsheet data found.

## 2019-03-28 NOTE — Progress Notes (Signed)
   Subjective: 2 Days Post-Op Procedure(s) (LRB): RIGHT TOTAL HIP ARTHROPLASTY (Right) Patient reports pain as mild.  Knee pain much improved. Patient is well, and has had no acute complaints or problems Denies any CP, SOB, ABD pain. We will continue therapy today.  Plan is to go Home after hospital stay.  Objective: Vital signs in last 24 hours: Temp:  [98.4 F (36.9 C)] 98.4 F (36.9 C) (07/23 0817) Pulse Rate:  [68-75] 68 (07/23 0817) Resp:  [17-20] 17 (07/23 0817) BP: (103-130)/(44-63) 103/44 (07/23 0817) SpO2:  [94 %-98 %] 98 % (07/23 0817)  Intake/Output from previous day: 07/22 0701 - 07/23 0700 In: 240 [P.O.:240] Out: 0  Intake/Output this shift: No intake/output data recorded.  Recent Labs    03/26/19 1111 03/27/19 0326 03/28/19 0350  HGB 12.9 10.6* 9.6*   Recent Labs    03/27/19 0326 03/28/19 0350  WBC 11.5* 12.9*  RBC 3.27* 2.95*  HCT 32.5* 29.5*  PLT 160 158   Recent Labs    03/27/19 0326 03/28/19 0350  NA 139 136  K 4.1 4.1  CL 109 105  CO2 23 28  BUN 22 26*  CREATININE 0.74 0.81  GLUCOSE 131* 101*  CALCIUM 8.0* 8.3*   No results for input(s): LABPT, INR in the last 72 hours.  EXAM General - Patient is Alert, Appropriate and Oriented Extremity - Neurovascular intact Sensation intact distally Intact pulses distally Dorsiflexion/Plantar flexion intact No cellulitis present Compartment soft Dressing - dressing C/D/I and no drainage, Praveena intact without drainage Motor Function - intact, moving foot and toes well on exam.   Past Medical History:  Diagnosis Date  . Carpal tunnel syndrome   . COPD (chronic obstructive pulmonary disease) ([place])   . Dyspnea   . Family history of adverse reaction to anesthesia    sister hard time waking up  . GCA (giant cell arteritis) ([place])   . H/O multiple pulmonary nodules   . Hypertension   . Hyponatremia   . Hypothyroidism   . Osteoarthritis   . Seasonal allergies   . Thyroid disease   .  Tingling in extremities     Assessment/Plan:   2 Days Post-Op Procedure(s) (LRB): RIGHT TOTAL HIP ARTHROPLASTY (Right) Active Problems:   Status post total hip replacement, right  Estimated body mass index is 24 kg/m as calculated from the following:   Height as of this encounter: 4\' 11"  (1.499 m).   Weight as of this encounter: 53.9 kg. Advance diet Up with therapy  Acute post op blood loss anemia -hemoglobin 9.6.  Continue with iron supplement.   Vital signs are stable Pain well controlled Care management to assist with discharge to home with home health PT today  DVT Prophylaxis - Lovenox, TED hose and SCDs Weight-Bearing as tolerated to right leg   T. Rachelle Hora, PA-C [place] 03/28/2019, 8:28 AM

## 2019-03-28 NOTE — Discharge Instructions (Signed)
ANTERIOR APPROACH TOTAL HIP REPLACEMENT POSTOPERATIVE DIRECTIONS   Hip Rehabilitation, Guidelines Following Surgery  The results of a hip operation are greatly improved after range of motion and muscle strengthening exercises. Follow all safety measures which are given to protect your hip. If any of these exercises cause increased pain or swelling in your joint, decrease the amount until you are comfortable again. Then slowly increase the exercises. Call your caregiver if you have problems or questions.   HOME CARE INSTRUCTIONS  Remove items at home which could result in a fall. This includes throw rugs or furniture in walking pathways.   ICE to the affected hip every three hours for 30 minutes at a time and then as needed for pain and swelling.  Continue to use ice on the hip for pain and swelling from surgery. You may notice swelling that will progress down to the foot and ankle.  This is normal after surgery.  Elevate the leg when you are not up walking on it.    Continue to use the breathing machine which will help keep your temperature down.  It is common for your temperature to cycle up and down following surgery, especially at night when you are not up moving around and exerting yourself.  The breathing machine keeps your lungs expanded and your temperature down.  Do not place pillow under knee, focus on keeping the knee straight while resting  DIET You may resume your previous home diet once your are discharged from the hospital.  DRESSING / WOUND CARE / SHOWERING Please remove provena negative pressure dressing on 04/04/2019 and apply honey comb dressing. Keep dressing clean and dry at all times.   ACTIVITY Walk with your walker as instructed. Use walker as long as suggested by your caregivers. Avoid periods of inactivity such as sitting longer than an hour when not asleep. This helps prevent blood clots.  You may resume a sexual relationship in one month or when given the OK by  your doctor.  You may return to work once you are cleared by your doctor.  Do not drive a car for 6 weeks or until released by you surgeon.  Do not drive while taking narcotics.  WEIGHT BEARING Weight bearing as tolerated. Use walker/cane as needed for at least 4 weeks post op.  POSTOPERATIVE CONSTIPATION PROTOCOL Constipation - defined medically as fewer than three stools per week and severe constipation as less than one stool per week.  One of the most common issues patients have following surgery is constipation.  Even if you have a regular bowel pattern at home, your normal regimen is likely to be disrupted due to multiple reasons following surgery.  Combination of anesthesia, postoperative narcotics, change in appetite and fluid intake all can affect your bowels.  In order to avoid complications following surgery, here are some recommendations in order to help you during your recovery period.  Colace (docusate) - Pick up an over-the-counter form of Colace or another stool softener and take twice a day as long as you are requiring postoperative pain medications.  Take with a full glass of water daily.  If you experience loose stools or diarrhea, hold the colace until you stool forms back up.  If your symptoms do not get better within 1 week or if they get worse, check with your doctor.  Dulcolax (bisacodyl) - Pick up over-the-counter and take as directed by the product packaging as needed to assist with the movement of your bowels.  Take with a  full glass of water.  Use this product as needed if not relieved by Colace only.  ° °MiraLax (polyethylene glycol) - Pick up over-the-counter to have on hand.  MiraLax is a solution that will increase the amount of water in your bowels to assist with bowel movements.  Take as directed and can mix with a glass of water, juice, soda, coffee, or tea.  Take if you go more than two days without a movement. °Do not use MiraLax more than once per day. Call your  doctor if you are still constipated or irregular after using this medication for 7 days in a row. ° °If you continue to have problems with postoperative constipation, please contact the office for further assistance and recommendations.  If you experience "the worst abdominal pain ever" or develop nausea or vomiting, please contact the office immediatly for further recommendations for treatment. ° °ITCHING ° If you experience itching with your medications, try taking only a single pain pill, or even half a pain pill at a time.  You can also use Benadryl over the counter for itching or also to help with sleep.  ° °TED HOSE STOCKINGS °Wear the elastic stockings on both legs for six weeks following surgery during the day but you may remove then at night for sleeping. ° °MEDICATIONS °See your medication summary on the “After Visit Summary” that the nursing staff will review with you prior to discharge.  You may have some home medications which will be placed on hold until you complete the course of blood thinner medication.  It is important for you to complete the blood thinner medication as prescribed by your surgeon.  Continue your approved medications as instructed at time of discharge. ° °PRECAUTIONS °If you experience chest pain or shortness of breath - call 911 immediately for transfer to the hospital emergency department.  °If you develop a fever greater that 101 F, purulent drainage from wound, increased redness or drainage from wound, foul odor from the wound/dressing, or calf pain - CONTACT YOUR SURGEON.   °                                                °FOLLOW-UP APPOINTMENTS °Make sure you keep all of your appointments after your operation with your surgeon and caregivers. You should call the office at the above phone number and make an appointment for approximately two weeks after the date of your surgery or on the date instructed by your surgeon outlined in the "After Visit Summary". ° °RANGE OF MOTION  AND STRENGTHENING EXERCISES  °These exercises are designed to help you keep full movement of your hip joint. Follow your caregiver's or physical therapist's instructions. Perform all exercises about fifteen times, three times per day or as directed. Exercise both hips, even if you have had only one joint replacement. These exercises can be done on a training (exercise) mat, on the floor, on a table or on a bed. Use whatever works the best and is most comfortable for you. Use music or television while you are exercising so that the exercises are a pleasant break in your day. This will make your life better with the exercises acting as a break in routine you can look forward to.  °Lying on your back, slowly slide your foot toward your buttocks, raising your knee up off the floor. Then   slowly slide your foot back down until your leg is straight again.  °Lying on your back spread your legs as far apart as you can without causing discomfort.  °Lying on your side, raise your upper leg and foot straight up from the floor as far as is comfortable. Slowly lower the leg and repeat.  °Lying on your back, tighten up the muscle in the front of your thigh (quadriceps muscles). You can do this by keeping your leg straight and trying to raise your heel off the floor. This helps strengthen the largest muscle supporting your knee.  °Lying on your back, tighten up the muscles of your buttocks both with the legs straight and with the knee bent at a comfortable angle while keeping your heel on the floor.  ° °IF YOU ARE TRANSFERRED TO A SKILLED REHAB FACILITY °If the patient is transferred to a skilled rehab facility following release from the hospital, a list of the current medications will be sent to the facility for the patient to continue.  When discharged from the skilled rehab facility, please have the facility set up the patient's Home Health Physical Therapy prior to being released. Also, the skilled facility will be responsible  for providing the patient with their medications at time of release from the facility to include their pain medication, the muscle relaxants, and their blood thinner medication. If the patient is still at the rehab facility at time of the two week follow up appointment, the skilled rehab facility will also need to assist the patient in arranging follow up appointment in our office and any transportation needs. ° °MAKE SURE YOU:  °Understand these instructions.  °Get help right away if you are not doing well or get worse.  ° ° °Pick up stool softner and laxative for home use following surgery while on pain medications. °Continue to use ice for pain and swelling after surgery. °Do not use any lotions or creams on the incision until instructed by your surgeon. ° °

## 2019-03-28 NOTE — Progress Notes (Signed)
Physical Therapy Treatment Patient Details Name: Mary Barker MRN: [id number] DOB: May 02, 1930 Today's Date: 03/28/2019    History of Present Illness 85 year old female admitted for R THA secondary to pain and AVN. PMH to includeL HTN, thyroid dz, OA, Carpal tunnel syndrome.    PT Comments    Pt did well with PT session showing good strength and tolerance with exercises, safe and independent mobility and ultimately was able to easily circumambulate the nurses' station and negotiate steps safely and confidently w/o assist.  Pt pleasant and eager to participate t/o the session, safe for home from PT perspective. Did review expected course of recovery, exercise regimen/HHPT expectations and answered all questions to her satisfaction.    Follow Up Recommendations  Home health PT     Equipment Recommendations  Rolling walker with 5" wheels(youth height)    Recommendations for Other Services       Precautions / Restrictions Precautions Precautions: Anterior Hip Precaution Booklet Issued: Yes (comment) Restrictions Weight Bearing Restrictions: Yes RLE Weight Bearing: Weight bearing as tolerated    Mobility  Bed Mobility Overal bed mobility: Modified Independent                Transfers Overall transfer level: Modified independent   Transfers: Sit to/from Stand Sit to Stand: Supervision         General transfer comment: Pt able to rise and actually maintain balance w/o use of AD  Ambulation/Gait Ambulation/Gait assistance: Modified independent (Device/Increase time) Gait Distance (Feet): 250 Feet Assistive device: Rolling walker (2 wheeled)       General Gait Details: Pt able to ambulate with easy confidence and light UE use of walker.  She was able to maintain good speed and consistent cadence w/o excessive fatigue, etc - generally doing very well POD2.   Stairs Stairs: Yes Stairs assistance: Modified independent (Device/Increase time) Stair Management: Two  rails Number of Stairs: 4 General stair comments: Pt does not have steps to negotiate at home, but trialed for exposure to the possibility.  She did well, did not need physical assist and showed good safety and confidence with the effort.    Wheelchair Mobility    Modified Rankin (Stroke Patients Only)       Balance Overall balance assessment: Modified Independent   Sitting balance-Leahy Scale: Normal       Standing balance-Leahy Scale: Good                              Cognition Arousal/Alertness: Awake/alert Behavior During Therapy: WFL for tasks assessed/performed Overall Cognitive Status: Within Functional Limits for tasks assessed                                        Exercises Total Joint Exercises Ankle Circles/Pumps: AROM;10 reps Quad Sets: Strengthening;10 reps Gluteal Sets: Strengthening;10 reps Short Arc Quad: Strengthening;15 reps Heel Slides: Strengthening;15 reps Hip ABduction/ADduction: Strengthening;15 reps Straight Leg Raises: AROM;20 reps(first 5 reps needig light AAROM, then w/o assist)    General Comments        Pertinent Vitals/Pain Pain Assessment: 0-10 Pain Score: 2  Pain Location: R anterior hip    Home Living                      Prior Function  PT Goals (current goals can now be found in the care plan section) Progress towards PT goals: Progressing toward goals    Frequency    BID      PT Plan Current plan remains appropriate    Co-evaluation              AM-PAC PT "6 Clicks" Mobility   Outcome Measure  Help needed turning from your back to your side while in a flat bed without using bedrails?: None Help needed moving from lying on your back to sitting on the side of a flat bed without using bedrails?: None Help needed moving to and from a bed to a chair (including a wheelchair)?: A Little Help needed standing up from a chair using your arms (e.g., wheelchair or  bedside chair)?: A Little Help needed to walk in hospital room?: A Little Help needed climbing 3-5 steps with a railing? : A Little 6 Click Score: 20    End of Session Equipment Utilized During Treatment: Gait belt Activity Tolerance: Patient tolerated treatment well Patient left: with chair alarm set;with call bell/phone within reach   PT Visit Diagnosis: Muscle weakness (generalized) (M62.81);Difficulty in walking, not elsewhere classified (R26.2)     Time: 0917-1000 PT Time Calculation (min) (ACUTE ONLY): 43 min  Charges:  $Gait Training: 8-22 mins $Therapeutic Exercise: 8-22 mins $Therapeutic Activity: 8-22 mins                     Kreg Shropshire, DPT 03/28/2019, 11:58 AM

## 2019-03-28 NOTE — Discharge Summary (Signed)
Physician Discharge Summary  Patient ID: Mary Barker MRN: [id number] DOB/AGE: [birth year] 85 y.o.  Admit date: 03/26/2019 Discharge date: 03/28/2019  Admission Diagnoses:  PRIMARY OSTEOARTHRITIS OF RIGHT HIP   Discharge Diagnoses: Patient Active Problem List   Diagnosis Date Noted  . Status post total hip replacement, right 03/26/2019  . Temporal arteritis ([place]) 09/03/2018  . Leukocytosis 08/12/2018  . Pneumonia 12/04/2016    Past Medical History:  Diagnosis Date  . Carpal tunnel syndrome   . COPD (chronic obstructive pulmonary disease) ([place])   . Dyspnea   . Family history of adverse reaction to anesthesia    sister hard time waking up  . GCA (giant cell arteritis) ([place])   . H/O multiple pulmonary nodules   . Hypertension   . Hyponatremia   . Hypothyroidism   . Osteoarthritis   . Seasonal allergies   . Thyroid disease   . Tingling in extremities      Transfusion: none   Consultants (if any):   Discharged Condition: Improved  Hospital Course: Mary Barker is an 85 y.o. female who was admitted 03/26/2019 with a diagnosis of right hip osteoarthritis and went to the operating room on 03/26/2019 and underwent the above named procedures.    Surgeries: Procedure(s): RIGHT TOTAL HIP ARTHROPLASTY on 03/26/2019 Patient tolerated the surgery well. Taken to PACU where she was stabilized and then transferred to the orthopedic floor.  Started on Lovenox 30 mg q 12 hrs. Foot pumps applied bilaterally at 80 mm. Heels elevated on bed with rolled towels. No evidence of DVT. Negative Homan. Physical therapy started on day #1 for gait training and transfer. OT started day #1 for ADL and assisted devices.  Patient's foley was d/c on day #1. Patient's IV  was d/c on day #2.  On post op day #2 patient was stable and ready for discharge to home with HHPT.  Implants: Medacta AMIS 2 standard stem, 48 mm Mpact DM cup and liner with metal S 28 mm head  She was given perioperative  antibiotics:  Anti-infectives (From admission, onward)   Start     Dose/Rate Route Frequency Ordered Stop   03/26/19 1330  ceFAZolin (ANCEF) IVPB 1 g/50 mL premix     1 g 100 mL/hr over 30 Minutes Intravenous Every 6 hours 03/26/19 1040 03/26/19 2006   03/26/19 0620  ceFAZolin (ANCEF) 2-4 GM/100ML-% IVPB    Note to Pharmacy: Jola Baptist   : cabinet override      03/26/19 0620 03/26/19 0735   03/25/19 2145  ceFAZolin (ANCEF) IVPB 2g/100 mL premix     2 g 200 mL/hr over 30 Minutes Intravenous  Once 03/25/19 2140 03/26/19 0740    .  She was given sequential compression devices, early ambulation, and Lovenox TEDs for DVT prophylaxis.  She benefited maximally from the hospital stay and there were no complications.    Recent vital signs:  Vitals:   03/28/19 0021 03/28/19 0817  BP: (!) 129/49 (!) 103/44  Pulse: 68 68  Resp: 20 17  Temp: 98.4 F (36.9 C) 98.4 F (36.9 C)  SpO2: 95% 98%    Recent laboratory studies:  Lab Results  Component Value Date   HGB 9.6 (L) 03/28/2019   HGB 10.6 (L) 03/27/2019   HGB 12.9 03/26/2019   Lab Results  Component Value Date   WBC 12.9 (H) 03/28/2019   PLT 158 03/28/2019   Lab Results  Component Value Date   INR 0.9 03/20/2019   Lab Results  Component Value Date   NA 136 03/28/2019   K 4.1 03/28/2019   CL 105 03/28/2019   CO2 28 03/28/2019   BUN 26 (H) 03/28/2019   CREATININE 0.81 03/28/2019   GLUCOSE 101 (H) 03/28/2019    Discharge Medications:   Allergies as of 03/28/2019      Reactions   Codeine Nausea And Vomiting      Medication List    TAKE these medications   acetaminophen 325 MG tablet Commonly known as: TYLENOL Take 1-2 tablets (325-650 mg total) by mouth every 6 (six) hours as needed for mild pain (pain score 1-3 or temp > 100.5).   Actemra 162 MG/0.9ML Sosy Generic drug: Tocilizumab Inject 162 mg into the skin every Tuesday.   alendronate 70 MG tablet Commonly known as: FOSAMAX Take 70 mg by mouth every  Friday. Take with a full glass of water on an empty stomach.   aspirin 81 MG tablet Take 81 mg by mouth every evening.   atenolol 25 MG tablet Commonly known as: TENORMIN Take 25 mg by mouth every other day.   azelastine 0.1 % nasal spray Commonly known as: ASTELIN Place 1 spray into both nostrils daily as needed for rhinitis or allergies. Use in each nostril as directed   Biotin Plus Keratin 10000-100 MCG-MG Tabs Take 1 tablet by mouth daily.   CALCIUM 600+D PO Take 1 tablet by mouth 2 (two) times a day.   CENTRUM SILVER PO Take 1 tablet by mouth every evening.   PRESERVISION AREDS 2 PO Take 1 capsule by mouth 2 (two) times daily.   enoxaparin 40 MG/0.4ML injection Commonly known as: Lovenox Inject 0.4 mLs (40 mg total) into the skin daily for 14 days.   fluticasone 50 MCG/ACT nasal spray Commonly known as: FLONASE Place 2 sprays into both nostrils daily.   hydrocortisone 25 MG suppository Commonly known as: ANUSOL-HC Place 25 mg rectally 2 (two) times daily as needed for hemorrhoids or anal itching.   levothyroxine 125 MCG tablet Commonly known as: SYNTHROID Take 125 mcg by mouth daily before breakfast.   losartan 25 MG tablet Commonly known as: COZAAR Take 25 mg by mouth daily.   montelukast 10 MG tablet Commonly known as: SINGULAIR Take 10 mg by mouth at bedtime.   predniSONE 20 MG tablet Commonly known as: DELTASONE Take 3 tablets (60 mg total) by mouth daily with breakfast.   predniSONE 5 MG tablet Commonly known as: DELTASONE Take 7.5 mg by mouth daily.   spironolactone 25 MG tablet Commonly known as: ALDACTONE Take 25 mg by mouth daily.   Systane Overnight Therapy 0.3 % Gel ophthalmic ointment Generic drug: hypromellose Place 1 application into both eyes at bedtime.   SYSTANE ULTRA OP Place 1 drop into both eyes 2 (two) times daily as needed (dry eyes).   traMADol 50 MG tablet Commonly known as: ULTRAM Take 1 tablet (50 mg total) by mouth  every 6 (six) hours.            Durable Medical Equipment  (From admission, onward)         Start     Ordered   03/26/19 1041  DME Walker rolling  Once    Question:  Patient needs a walker to treat with the following condition  Answer:  Status post total hip replacement, right   03/26/19 1040   03/26/19 1041  DME 3 n 1  Once     03/26/19 1040   03/26/19 1041  DME Bedside  commode  Once    Question:  Patient needs a bedside commode to treat with the following condition  Answer:  Status post total hip replacement, right   03/26/19 1040          Diagnostic Studies: Dg Hip Operative Unilat W Or W/o Pelvis Right  Result Date: 03/26/2019 CLINICAL DATA:  Anterior right hip replacement EXAM: OPERATIVE RIGHT HIP (WITH PELVIS IF PERFORMED) to VIEWS TECHNIQUE: Fluoroscopic spot image(s) were submitted for interpretation post-operatively. COMPARISON:  MRI 02/06/2019 FINDINGS: Intraoperative imaging demonstrates changes of right hip replacement. Normal AP alignment. No visible complicating feature. IMPRESSION: Right hip replacement.  No visible complicating feature. Electronically Signed   By: Rolm Baptise M.D.   On: 03/26/2019 10:23   Dg Hip Unilat W Or W/o Pelvis 2-3 Views Right  Result Date: 03/26/2019 CLINICAL DATA:  Post right total hip replacement EXAM: DG HIP (WITH OR WITHOUT PELVIS) 2-3V RIGHT COMPARISON:  MRI 02/06/2019 FINDINGS: Changes of right hip replacement. Normal alignment. No hardware bony complicating feature. IMPRESSION: Right hip replacement.  No visible complicating feature. Electronically Signed   By: Rolm Baptise M.D.   On: 03/26/2019 09:46    Disposition:     Follow-up Information    Duanne Guess, PA-C Follow up in 2 week(s).   Specialties: Orthopedic Surgery, Emergency Medicine Contact information: [place] [address] [phone]            Signed: Feliberto Gottron 03/28/2019, 8:45 AM

## 2019-03-29 DIAGNOSIS — Z7901 Long term (current) use of anticoagulants: Secondary | ICD-10-CM | POA: Diagnosis not present

## 2019-03-29 DIAGNOSIS — M316 Other giant cell arteritis: Secondary | ICD-10-CM | POA: Diagnosis not present

## 2019-03-29 DIAGNOSIS — M810 Age-related osteoporosis without current pathological fracture: Secondary | ICD-10-CM | POA: Diagnosis not present

## 2019-03-29 DIAGNOSIS — Z471 Aftercare following joint replacement surgery: Secondary | ICD-10-CM | POA: Diagnosis not present

## 2019-03-29 DIAGNOSIS — E039 Hypothyroidism, unspecified: Secondary | ICD-10-CM | POA: Diagnosis not present

## 2019-03-29 DIAGNOSIS — I10 Essential (primary) hypertension: Secondary | ICD-10-CM | POA: Diagnosis not present

## 2019-03-29 DIAGNOSIS — Z96641 Presence of right artificial hip joint: Secondary | ICD-10-CM | POA: Diagnosis not present

## 2019-03-29 DIAGNOSIS — J449 Chronic obstructive pulmonary disease, unspecified: Secondary | ICD-10-CM | POA: Diagnosis not present

## 2019-04-08 DIAGNOSIS — Z7901 Long term (current) use of anticoagulants: Secondary | ICD-10-CM | POA: Diagnosis not present

## 2019-04-08 DIAGNOSIS — M810 Age-related osteoporosis without current pathological fracture: Secondary | ICD-10-CM | POA: Diagnosis not present

## 2019-04-08 DIAGNOSIS — I10 Essential (primary) hypertension: Secondary | ICD-10-CM | POA: Diagnosis not present

## 2019-04-08 DIAGNOSIS — Z96641 Presence of right artificial hip joint: Secondary | ICD-10-CM | POA: Diagnosis not present

## 2019-04-08 DIAGNOSIS — Z471 Aftercare following joint replacement surgery: Secondary | ICD-10-CM | POA: Diagnosis not present

## 2019-04-08 DIAGNOSIS — M316 Other giant cell arteritis: Secondary | ICD-10-CM | POA: Diagnosis not present

## 2019-04-08 DIAGNOSIS — E039 Hypothyroidism, unspecified: Secondary | ICD-10-CM | POA: Diagnosis not present

## 2019-04-08 DIAGNOSIS — J449 Chronic obstructive pulmonary disease, unspecified: Secondary | ICD-10-CM | POA: Diagnosis not present

## 2019-04-10 DIAGNOSIS — M25551 Pain in right hip: Secondary | ICD-10-CM | POA: Diagnosis not present

## 2019-04-10 DIAGNOSIS — M316 Other giant cell arteritis: Secondary | ICD-10-CM | POA: Diagnosis not present

## 2019-04-18 ENCOUNTER — Inpatient Hospital Stay
Admission: RE | Admit: 2019-04-18 | Discharge: 2019-04-24 | DRG: 330 | Disposition: A | Discharge status: Home / Self Care | Payer: PPO | Attending: Surgery | Admitting: Surgery

## 2019-04-18 ENCOUNTER — Encounter: Payer: Self-pay | Admitting: Emergency Medicine

## 2019-04-18 ENCOUNTER — Inpatient Hospital Stay: Payer: PPO

## 2019-04-18 ENCOUNTER — Emergency Department: Payer: PPO

## 2019-04-18 ENCOUNTER — Other Ambulatory Visit: Payer: Self-pay

## 2019-04-18 DIAGNOSIS — Z7982 Long term (current) use of aspirin: Secondary | ICD-10-CM

## 2019-04-18 DIAGNOSIS — R1110 Vomiting, unspecified: Secondary | ICD-10-CM | POA: Diagnosis not present

## 2019-04-18 DIAGNOSIS — Z87891 Personal history of nicotine dependence: Secondary | ICD-10-CM | POA: Diagnosis not present

## 2019-04-18 DIAGNOSIS — Z8781 Personal history of (healed) traumatic fracture: Secondary | ICD-10-CM

## 2019-04-18 DIAGNOSIS — E876 Hypokalemia: Secondary | ICD-10-CM | POA: Diagnosis not present

## 2019-04-18 DIAGNOSIS — I10 Essential (primary) hypertension: Secondary | ICD-10-CM | POA: Diagnosis present

## 2019-04-18 DIAGNOSIS — K565 Intestinal adhesions [bands], unspecified as to partial versus complete obstruction: Secondary | ICD-10-CM | POA: Diagnosis present

## 2019-04-18 DIAGNOSIS — R110 Nausea: Secondary | ICD-10-CM | POA: Diagnosis not present

## 2019-04-18 DIAGNOSIS — Z79899 Other long term (current) drug therapy: Secondary | ICD-10-CM | POA: Diagnosis not present

## 2019-04-18 DIAGNOSIS — R0600 Dyspnea, unspecified: Secondary | ICD-10-CM | POA: Diagnosis not present

## 2019-04-18 DIAGNOSIS — E039 Hypothyroidism, unspecified: Secondary | ICD-10-CM | POA: Diagnosis present

## 2019-04-18 DIAGNOSIS — R188 Other ascites: Secondary | ICD-10-CM | POA: Diagnosis not present

## 2019-04-18 DIAGNOSIS — Z7951 Long term (current) use of inhaled steroids: Secondary | ICD-10-CM | POA: Diagnosis not present

## 2019-04-18 DIAGNOSIS — R918 Other nonspecific abnormal finding of lung field: Secondary | ICD-10-CM | POA: Diagnosis not present

## 2019-04-18 DIAGNOSIS — J449 Chronic obstructive pulmonary disease, unspecified: Secondary | ICD-10-CM | POA: Diagnosis present

## 2019-04-18 DIAGNOSIS — Z79891 Long term (current) use of opiate analgesic: Secondary | ICD-10-CM

## 2019-04-18 DIAGNOSIS — K56609 Unspecified intestinal obstruction, unspecified as to partial versus complete obstruction: Secondary | ICD-10-CM | POA: Diagnosis not present

## 2019-04-18 DIAGNOSIS — Z96641 Presence of right artificial hip joint: Secondary | ICD-10-CM | POA: Diagnosis present

## 2019-04-18 DIAGNOSIS — Z79890 Hormone replacement therapy (postmenopausal): Secondary | ICD-10-CM | POA: Diagnosis not present

## 2019-04-18 DIAGNOSIS — R52 Pain, unspecified: Secondary | ICD-10-CM | POA: Diagnosis not present

## 2019-04-18 DIAGNOSIS — Z20828 Contact with and (suspected) exposure to other viral communicable diseases: Secondary | ICD-10-CM | POA: Diagnosis not present

## 2019-04-18 DIAGNOSIS — E079 Disorder of thyroid, unspecified: Secondary | ICD-10-CM | POA: Diagnosis not present

## 2019-04-18 DIAGNOSIS — Z885 Allergy status to narcotic agent status: Secondary | ICD-10-CM

## 2019-04-18 DIAGNOSIS — K460 Unspecified abdominal hernia with obstruction, without gangrene: Secondary | ICD-10-CM | POA: Diagnosis not present

## 2019-04-18 DIAGNOSIS — Z03818 Encounter for observation for suspected exposure to other biological agents ruled out: Secondary | ICD-10-CM | POA: Diagnosis not present

## 2019-04-18 DIAGNOSIS — E871 Hypo-osmolality and hyponatremia: Secondary | ICD-10-CM | POA: Diagnosis not present

## 2019-04-18 DIAGNOSIS — Z4682 Encounter for fitting and adjustment of non-vascular catheter: Secondary | ICD-10-CM | POA: Diagnosis not present

## 2019-04-18 DIAGNOSIS — K5669 Other partial intestinal obstruction: Secondary | ICD-10-CM | POA: Diagnosis not present

## 2019-04-18 DIAGNOSIS — M316 Other giant cell arteritis: Secondary | ICD-10-CM | POA: Diagnosis not present

## 2019-04-18 DIAGNOSIS — Z7952 Long term (current) use of systemic steroids: Secondary | ICD-10-CM

## 2019-04-18 DIAGNOSIS — Z8249 Family history of ischemic heart disease and other diseases of the circulatory system: Secondary | ICD-10-CM

## 2019-04-18 DIAGNOSIS — R1084 Generalized abdominal pain: Secondary | ICD-10-CM | POA: Diagnosis not present

## 2019-04-18 DIAGNOSIS — G5601 Carpal tunnel syndrome, right upper limb: Secondary | ICD-10-CM | POA: Diagnosis not present

## 2019-04-18 DIAGNOSIS — Z931 Gastrostomy status: Secondary | ICD-10-CM | POA: Diagnosis not present

## 2019-04-18 LAB — URINALYSIS, COMPLETE (UACMP) WITH MICROSCOPIC
Bacteria, UA: NONE SEEN
Bilirubin Urine: NEGATIVE
Glucose, UA: NEGATIVE mg/dL
Hgb urine dipstick: NEGATIVE
Ketones, ur: 5 mg/dL — AB
Leukocytes,Ua: NEGATIVE
Nitrite: NEGATIVE
Protein, ur: NEGATIVE mg/dL
Specific Gravity, Urine: 1.042 — ABNORMAL HIGH (ref 1.005–1.030)
pH: 8 (ref 5.0–8.0)

## 2019-04-18 LAB — COMPREHENSIVE METABOLIC PANEL
ALT: 22 U/L (ref 0–44)
AST: 24 U/L (ref 15–41)
Albumin: 4.4 g/dL (ref 3.5–5.0)
Alkaline Phosphatase: 66 U/L (ref 38–126)
Anion gap: 12 (ref 5–15)
BUN: 22 mg/dL (ref 8–23)
CO2: 23 mmol/L (ref 22–32)
Calcium: 10 mg/dL (ref 8.9–10.3)
Chloride: 104 mmol/L (ref 98–111)
Creatinine, Ser: 0.92 mg/dL (ref 0.44–1.00)
GFR calc Af Amer: >60 mL/min (ref >60)
GFR calc non Af Amer: 56 mL/min — ABNORMAL LOW (ref >60)
Glucose, Bld: 132 mg/dL — ABNORMAL HIGH (ref 70–99)
Potassium: 3.2 mmol/L — ABNORMAL LOW (ref 3.5–5.1)
Sodium: 139 mmol/L (ref 135–145)
Total Bilirubin: 1 mg/dL (ref 0.3–1.2)
Total Protein: 6.3 g/dL — ABNORMAL LOW (ref 6.5–8.1)

## 2019-04-18 LAB — CBC
HCT: 42.4 % (ref 36.0–46.0)
Hemoglobin: 14.2 g/dL (ref 12.0–15.0)
MCH: 32.9 pg (ref 26.0–34.0)
MCHC: 33.5 g/dL (ref 30.0–36.0)
MCV: 98.4 fL (ref 80.0–100.0)
Platelets: 224 10*3/uL (ref 150–400)
RBC: 4.31 MIL/uL (ref 3.87–5.11)
RDW: 15.3 % (ref 11.5–15.5)
WBC: 7.2 10*3/uL (ref 4.0–10.5)
nRBC: 0 % (ref 0.0–0.2)

## 2019-04-18 LAB — LIPASE, BLOOD: Lipase: 51 U/L (ref 11–51)

## 2019-04-18 LAB — SARS CORONAVIRUS 2 BY RT PCR (HOSPITAL ORDER, PERFORMED IN ~~LOC~~ HOSPITAL LAB): SARS Coronavirus 2: NEGATIVE

## 2019-04-18 LAB — TROPONIN I (HIGH SENSITIVITY): Troponin I (High Sensitivity): 6 ng/L (ref <18)

## 2019-04-18 MED ORDER — ONDANSETRON HCL 4 MG/2ML IJ SOLN
4.0000 mg | Freq: Four times a day (QID) | INTRAMUSCULAR | Status: DC | PRN
  Administered 2019-04-18 – 2019-04-19 (×4): 4 mg via INTRAVENOUS
  Filled 2019-04-18 (×4): qty 2

## 2019-04-18 MED ORDER — ONDANSETRON 4 MG PO TBDP: 4.0000 mg | ORAL_TABLET | Freq: Four times a day (QID) | ORAL | Status: DC | PRN

## 2019-04-18 MED ORDER — KETOROLAC TROMETHAMINE 30 MG/ML IJ SOLN
INTRAMUSCULAR | Status: AC
  Administered 2019-04-18: 10:00:00 30 mg
  Filled 2019-04-18: qty 1

## 2019-04-18 MED ORDER — KETOROLAC TROMETHAMINE 15 MG/ML IJ SOLN: 15.0000 mg | Freq: Four times a day (QID) | INTRAMUSCULAR | Status: DC | PRN

## 2019-04-18 MED ORDER — FENTANYL CITRATE (PF) 100 MCG/2ML IJ SOLN
50.0000 ug | Freq: Once | INTRAMUSCULAR | Status: AC
  Administered 2019-04-18: 50 ug via INTRAVENOUS
  Filled 2019-04-18: qty 2

## 2019-04-18 MED ORDER — IOHEXOL 300 MG/ML  SOLN
75.0000 mL | Freq: Once | INTRAMUSCULAR | Status: AC | PRN
  Administered 2019-04-18: 06:00:00 75 mL via INTRAVENOUS

## 2019-04-18 MED ORDER — HYDROMORPHONE HCL 1 MG/ML IJ SOLN
0.5000 mg | INTRAMUSCULAR | Status: DC | PRN
  Administered 2019-04-18 (×2): 0.5 mg via INTRAVENOUS
  Filled 2019-04-18 (×2): qty 1

## 2019-04-18 MED ORDER — SODIUM CHLORIDE 0.9% FLUSH
3.0000 mL | Freq: Once | INTRAVENOUS | Status: AC
  Administered 2019-04-18: 10:00:00 3 mL via INTRAVENOUS

## 2019-04-18 MED ORDER — ONDANSETRON 4 MG PO TBDP
4.0000 mg | ORAL_TABLET | Freq: Once | ORAL | Status: AC | PRN
  Administered 2019-04-18: 05:00:00 4 mg via ORAL
  Filled 2019-04-18: qty 1

## 2019-04-18 MED ORDER — KCL IN DEXTROSE-NACL 20-5-0.45 MEQ/L-%-% IV SOLN
INTRAVENOUS | Status: DC
  Administered 2019-04-18 – 2019-04-23 (×9): via INTRAVENOUS
  Filled 2019-04-18 (×13): qty 1000

## 2019-04-18 MED ORDER — PREDNISONE 5 MG PO TABS: 7.5000 mg | ORAL_TABLET | Freq: Every day | ORAL | Status: DC

## 2019-04-18 MED ORDER — POLYETHYLENE GLYCOL 3350 17 G PO PACK
17.0000 g | PACK | Freq: Every day | ORAL | Status: DC | PRN
  Administered 2019-04-23: 17 g via ORAL
  Filled 2019-04-18 (×2): qty 1

## 2019-04-18 MED ORDER — HYDRALAZINE HCL 20 MG/ML IJ SOLN
5.0000 mg | Freq: Four times a day (QID) | INTRAMUSCULAR | Status: DC | PRN
  Filled 2019-04-18: qty 0.25

## 2019-04-18 MED ORDER — METOPROLOL TARTRATE 5 MG/5ML IV SOLN
5.0000 mg | Freq: Four times a day (QID) | INTRAVENOUS | Status: DC
  Administered 2019-04-18 – 2019-04-22 (×16): 5 mg via INTRAVENOUS
  Filled 2019-04-18 (×16): qty 5

## 2019-04-18 MED ORDER — PREDNISONE 5 MG PO TABS
5.0000 mg | ORAL_TABLET | Freq: Every day | ORAL | Status: DC
  Administered 2019-04-18 – 2019-04-24 (×6): 5 mg via ORAL
  Filled 2019-04-18 (×7): qty 1

## 2019-04-18 MED ORDER — ENOXAPARIN SODIUM 40 MG/0.4ML ~~LOC~~ SOLN
40.0000 mg | SUBCUTANEOUS | Status: DC
  Administered 2019-04-18 – 2019-04-20 (×3): 40 mg via SUBCUTANEOUS
  Filled 2019-04-18 (×4): qty 0.4

## 2019-04-18 MED ORDER — PANTOPRAZOLE SODIUM 40 MG IV SOLR
40.0000 mg | Freq: Every day | INTRAVENOUS | Status: DC
  Administered 2019-04-18 – 2019-04-21 (×4): 40 mg via INTRAVENOUS
  Filled 2019-04-18 (×5): qty 40

## 2019-04-18 NOTE — ED Notes (Signed)
MD at bedside with patient and son.

## 2019-04-18 NOTE — Progress Notes (Signed)
Pt arrived to floor in good spirits. Ambulated to bathroom x1 assistance. Pt states pain is improved since medication in ER. Pt and son educated on new visitation policy and verbalize understanding. Password set per pt request. Pt connected to LIWS by NG tube. Pt understands plan of care including NPO at this time. Pt has call bell, phone, and remote within reach. Pt educated to call for assistance before getting up. Pt verbalizes understanding. Denies further needs. NAD.

## 2019-04-18 NOTE — ED Notes (Signed)
Pt to room 25 via w/c with no distress noted; assisted into hosp gown & on card monitor; pt reports onset generalized abd pain accomp by N/V at 11pm; resp even/unlab, lungs clear, apical audible & regular, +BS, abd soft/nondist with diffuse discomfort on palpation

## 2019-04-18 NOTE — ED Notes (Signed)
Pt to CT via stretcher accomp by CT tech 

## 2019-04-18 NOTE — H&P (Signed)
[facility] SURGICAL HISTORY & PHYSICAL (cpt [phone])  HISTORY OF PRESENT ILLNESS (HPI):  85 y.o. female presented to [facility] ED overngiht for evaluation of abdominal pain. Patient reports that around 10-11 PM last night she had the sudden onset of lower crampy abdominal pain. She endorsed associated nausea and emesis. She did not try anything for the pain. No fever, chills, cough, congestion, CP, or bladder changes. She did endorse some SOB. No history of similar pain. She did have a BM this morning but denied passing flatus. Previous surgical history consistent for appendectomy 10 years ago. Work up in the ED was concerning for hypokalemia (3.2) and small bowel obstruction with transition in the pelvis concerning for adhesive disease.    General surgery is consulted by emergency medicine physician Dr Rudene Re, MD for evaluation and management of small bowel obstruction.    PAST MEDICAL HISTORY (PMH):  Past Medical History:  Diagnosis Date  . Carpal tunnel syndrome   . COPD (chronic obstructive pulmonary disease) ([place])   . Dyspnea   . Family history of adverse reaction to anesthesia    sister hard time waking up  . GCA (giant cell arteritis) ([place])   . H/O multiple pulmonary nodules   . Hypertension   . Hyponatremia   . Hypothyroidism   . Osteoarthritis   . Seasonal allergies   . Thyroid disease   . Tingling in extremities     Reviewed. Otherwise negative.   PAST SURGICAL HISTORY ([place]):  Past Surgical History:  Procedure Laterality Date  . APPENDECTOMY    . ARTERY BIOPSY Left 07/31/2018   Procedure: BIOPSY TEMPORAL ARTERY;  Surgeon: Benjamine Sprague, DO;  Location: [facility] ORS;  Service: General;  Laterality: Left;  . BREAST EXCISIONAL BIOPSY Left 1954   neg  . BREAST SURGERY    . COLONOSCOPY    . DILATION AND CURETTAGE OF UTERUS    . EYE SURGERY Bilateral    cataract  . KNEE ARTHROSCOPY    . NASAL POLYP SURGERY    . TONSILLECTOMY    . TOTAL HIP ARTHROPLASTY  Right 03/26/2019   Procedure: RIGHT TOTAL HIP ARTHROPLASTY;  Surgeon: Hessie Knows, MD;  Location: [facility] ORS;  Service: Orthopedics;  Laterality: Right;    Reviewed. Otherwise negative.   MEDICATIONS:  Prior to Admission medications   Medication Sig Start Date End Date Taking? Authorizing Provider  acetaminophen (TYLENOL) 325 MG tablet Take 1-2 tablets (325-650 mg total) by mouth every 6 (six) hours as needed for mild pain (pain score 1-3 or temp > 100.5). 03/28/19   Duanne Guess, PA-C  alendronate (FOSAMAX) 70 MG tablet Take 70 mg by mouth every Friday. Take with a full glass of water on an empty stomach.    [provider]  aspirin 81 MG tablet Take 81 mg by mouth every evening.     [provider]  atenolol (TENORMIN) 25 MG tablet Take 25 mg by mouth every other day.     [provider]  azelastine (ASTELIN) 0.1 % nasal spray Place 1 spray into both nostrils daily as needed for rhinitis or allergies. Use in each nostril as directed    [provider]  Calcium Carbonate-Vitamin D (CALCIUM 600+D PO) Take 1 tablet by mouth 2 (two) times a day.    [provider]  enoxaparin (LOVENOX) 40 MG/0.4ML injection Inject 0.4 mLs (40 mg total) into the skin daily for 14 days. 03/28/19 04/11/19  Duanne Guess, PA-C  fluticasone (FLONASE) 50 MCG/ACT  nasal spray Place 2 sprays into both nostrils daily.    [provider]  hydrocortisone (ANUSOL-HC) 25 MG suppository Place 25 mg rectally 2 (two) times daily as needed for hemorrhoids or anal itching.    [provider]  hypromellose (SYSTANE OVERNIGHT THERAPY) 0.3 % GEL ophthalmic ointment Place 1 application into both eyes at bedtime.    [provider]  levothyroxine (SYNTHROID, LEVOTHROID) 125 MCG tablet Take 125 mcg by mouth daily before breakfast.     [provider]  losartan (COZAAR) 25 MG tablet Take 25 mg by mouth daily.     [provider]  montelukast  (SINGULAIR) 10 MG tablet Take 10 mg by mouth at bedtime.    [provider]  Multiple Vitamins-Minerals (CENTRUM SILVER PO) Take 1 tablet by mouth every evening.     [provider]  Multiple Vitamins-Minerals (PRESERVISION AREDS 2 PO) Take 1 capsule by mouth 2 (two) times daily.    [provider]  Polyethyl Glycol-Propyl Glycol (SYSTANE ULTRA OP) Place 1 drop into both eyes 2 (two) times daily as needed (dry eyes).    [provider]  predniSONE (DELTASONE) 5 MG tablet Take 7.5 mg by mouth daily. 11/29/18   [provider]  Specialty Vitamins Products (BIOTIN PLUS KERATIN) 10000-100 MCG-MG TABS Take 1 tablet by mouth daily.    [provider]  spironolactone (ALDACTONE) 25 MG tablet Take 25 mg by mouth daily.    [provider]  Tocilizumab (ACTEMRA) 162 MG/0.9ML SOSY Inject 162 mg into the skin every Tuesday.    [provider]  traMADol (ULTRAM) 50 MG tablet Take 1 tablet (50 mg total) by mouth every 6 (six) hours. 03/28/19   Duanne Guess, PA-C     ALLERGIES:  Allergies  Allergen Reactions  . Codeine Nausea And Vomiting     SOCIAL HISTORY:  Social History   Socioeconomic History  . Marital status: Widowed    Spouse name: Not on file  . Number of children: Not on file  . Years of education: Not on file  . Highest education level: Not on file  Occupational History  . Not on file  Social Needs  . Financial resource strain: Not on file  . Food insecurity    Worry: Not on file    Inability: Not on file  . Transportation needs    Medical: Not on file    Non-medical: Not on file  Tobacco Use  . Smoking status: Former Smoker    Types: Cigarettes    Quit date: 03/19/1981    Years since quitting: 38.1  . Smokeless tobacco: Never Used  Substance and Sexual Activity  . Alcohol use: Yes    Alcohol/week: 3.0 standard drinks    Types: 1 Glasses of wine, 2 Shots of liquor per week    Comment: pt states one  drink a day  . Drug use: No  . Sexual activity: Never  Lifestyle  . Physical activity    Days per week: Not on file    Minutes per session: Not on file  . Stress: Not on file  Relationships  . Social Herbalist on phone: Not on file    Gets together: Not on file    Attends religious service: Not on file    Active member of club or organization: Not on file    Attends meetings of clubs or organizations: Not on file    Relationship status: Not on file  .  Intimate partner violence    Fear of current or ex partner: Not on file    Emotionally abused: Not on file    Physically abused: Not on file    Forced sexual activity: Not on file  Other Topics Concern  . Not on file  Social History Narrative  . Not on file     FAMILY HISTORY:  Family History  Problem Relation Age of Onset  . CAD Father   . CAD Sister   . CAD Brother     Otherwise negative.   REVIEW OF SYSTEMS:  Review of Systems  Constitutional: Negative for chills and fever.  HENT: Negative for congestion and sore throat.   Respiratory: Positive for shortness of breath. Negative for cough.   Cardiovascular: Negative for chest pain and palpitations.  Gastrointestinal: Positive for abdominal pain, nausea and vomiting. Negative for blood in stool, constipation and diarrhea.  Neurological: Negative for dizziness and headaches.  All other systems reviewed and are negative.   VITAL SIGNS:  Temp:  [98 F (36.7 C)] 98 F (36.7 C) (08/13 0421) Pulse Rate:  [59-73] 70 (08/13 0630) Resp:  [14-22] 16 (08/13 0630) BP: (166-183)/(63-78) 176/63 (08/13 0630) SpO2:  [92 %-100 %] 92 % (08/13 0630) Weight:  [54 kg] 54 kg (08/13 0405)     Height: 4\' 11"  (149.9 cm) Weight: 54 kg BMI (Calculated): 24.02   PHYSICAL EXAM:  Physical Exam Vitals signs and nursing note reviewed.  Constitutional:      General: She is not in acute distress.    Appearance: She is well-developed. She is not ill-appearing.  HENT:     Head:  Normocephalic and atraumatic.     Nose:     Comments: NGT in place Cardiovascular:     Rate and Rhythm: Normal rate and regular rhythm.     Heart sounds: Normal heart sounds. No murmur. No friction rub. No gallop.   Pulmonary:     Effort: Pulmonary effort is normal. No respiratory distress.     Breath sounds: Normal breath sounds. No wheezing or rhonchi.  Abdominal:     General: Abdomen is flat. A surgical scar is present. There is no distension.     Palpations: Abdomen is soft.     Tenderness: There is abdominal tenderness in the right lower quadrant, suprapubic area and left lower quadrant. There is no guarding or rebound.  Genitourinary:    Comments: Deferred Skin:    General: Skin is warm and dry.     Coloration: Skin is not jaundiced or pale.  Neurological:     General: No focal deficit present.     Mental Status: She is alert and oriented to person, place, and time.  Psychiatric:        Mood and Affect: Mood normal.        Behavior: Behavior normal.     INTAKE/OUTPUT:  This shift: No intake/output data recorded.  Last 2 shifts: @IOLAST2SHIFTS @  Labs:  CBC Latest Ref Rng & Units 04/18/2019 03/28/2019 03/27/2019  WBC 4.0 - 10.5 K/uL 7.2 12.9(H) 11.5(H)  Hemoglobin 12.0 - 15.0 g/dL 14.2 9.6(L) 10.6(L)  Hematocrit 36.0 - 46.0 % 42.4 29.5(L) 32.5(L)  Platelets 150 - 400 K/uL 224 158 160   CMP Latest Ref Rng & Units 04/18/2019 03/28/2019 03/27/2019  Glucose 70 - 99 mg/dL 132(H) 101(H) 131(H)  BUN 8 - 23 mg/dL 22 26(H) 22  Creatinine 0.44 - 1.00 mg/dL 0.92 0.81 0.74  Sodium 135 - 145 mmol/L 139 136 139  Potassium 3.5 - 5.1 mmol/L 3.2(L) 4.1 4.1  Chloride 98 - 111 mmol/L 104 105 109  CO2 22 - 32 mmol/L 23 28 23   Calcium 8.9 - 10.3 mg/dL 10.0 8.3(L) 8.0(L)  Total Protein 6.5 - 8.1 g/dL 6.3(L) - -  Total Bilirubin 0.3 - 1.2 mg/dL 1.0 - -  Alkaline Phos 38 - 126 U/L 66 - -  AST 15 - 41 U/L 24 - -  ALT 0 - 44 U/L 22 - -     Imaging studies:   CT Abdomen/Pelvis  (04/18/2019) personally reviewed which shows small bowel dilation and possible transition point in the pelvis without evidence of perforation, and radiologist report reviewed:    IMPRESSION: High-grade distal small bowel obstruction with mesenteric edema, likely due to adhesive disease.   Assessment/Plan: (ICD-10's: K59.609) 85 y.o. female with abdominal pain, nausea, and emesis most likely related to small bowel obstruction with transition point in the pelvis attributable to post-surgical adhesions following previous appendectomy, complicated by hypokalemia and advanced age.    - Admit to general surgery   - NGT decompression; monitor output   - NPO + IVF  - Replete K+ in IVF; monitor  - Morning KUB  - pain control prn; antiemetics prn  - monitor abdominal examination; on-going bowel function  - no indication for emergent surgical intervention; she understands if she fails conservative management she may require surgical intervention sooner.   - medical management of comorbidities   - DVT prophylaxis  All of the above findings and recommendations were discussed with the patient and her son, and all of their questions were answered to their expressed satisfaction.  -- Edison Simon, PA-C [facility] 04/18/2019, 7:36 AM [phone] M-F: 7am - 4pm

## 2019-04-18 NOTE — ED Notes (Signed)
Pt returns to room 

## 2019-04-18 NOTE — ED Triage Notes (Signed)
Pt presents to ED with sudden onset of generalized abd pain with nausea and vomiting around 2300 this evening. EMS VS:  97.4 temp 99% on RA 72 HR  169/76

## 2019-04-18 NOTE — ED Provider Notes (Signed)
[facility] Emergency Department Provider Note  ____________________________________________  Time seen: Approximately 5:42 AM  I have reviewed the triage vital signs and the nursing notes.   HISTORY  Chief Complaint Emesis and Abdominal Pain   HPI Mary Barker is a 85 y.o. female with a history of COPD, giant cell arteritis, hypertension, hypothyroidism who presents for evaluation of abdominal pain.  Patient reports sudden onset of diffuse cramping severe abdominal pain that started 11 PM this evening.  She reports nausea and dry heaving.  No vomiting, no diarrhea, no constipation.  Last BM was yesterday morning.  No chest pain or shortness of breath, no dysuria or hematuria.  Pain does not radiate to the back.   Past Medical History:  Diagnosis Date  . Carpal tunnel syndrome   . COPD (chronic obstructive pulmonary disease) ([place])   . Dyspnea   . Family history of adverse reaction to anesthesia    sister hard time waking up  . GCA (giant cell arteritis) ([place])   . H/O multiple pulmonary nodules   . Hypertension   . Hyponatremia   . Hypothyroidism   . Osteoarthritis   . Seasonal allergies   . Thyroid disease   . Tingling in extremities     Patient Active Problem List   Diagnosis Date Noted  . Status post total hip replacement, right 03/26/2019  . Temporal arteritis ([place]) 09/03/2018  . Leukocytosis 08/12/2018  . Pneumonia 12/04/2016    Past Surgical History:  Procedure Laterality Date  . APPENDECTOMY    . ARTERY BIOPSY Left 07/31/2018   Procedure: BIOPSY TEMPORAL ARTERY;  Surgeon: Benjamine Sprague, DO;  Location: [facility] ORS;  Service: General;  Laterality: Left;  . BREAST EXCISIONAL BIOPSY Left 1954   neg  . BREAST SURGERY    . COLONOSCOPY    . DILATION AND CURETTAGE OF UTERUS    . EYE SURGERY Bilateral    cataract  . KNEE ARTHROSCOPY    . NASAL POLYP SURGERY    . TONSILLECTOMY    . TOTAL HIP ARTHROPLASTY Right 03/26/2019   Procedure:  RIGHT TOTAL HIP ARTHROPLASTY;  Surgeon: Hessie Knows, MD;  Location: [facility] ORS;  Service: Orthopedics;  Laterality: Right;    Prior to Admission medications   Medication Sig Start Date End Date Taking? Authorizing Provider  acetaminophen (TYLENOL) 325 MG tablet Take 1-2 tablets (325-650 mg total) by mouth every 6 (six) hours as needed for mild pain (pain score 1-3 or temp > 100.5). 03/28/19   Duanne Guess, PA-C  alendronate (FOSAMAX) 70 MG tablet Take 70 mg by mouth every Friday. Take with a full glass of water on an empty stomach.    [provider]  aspirin 81 MG tablet Take 81 mg by mouth every evening.     [provider]  atenolol (TENORMIN) 25 MG tablet Take 25 mg by mouth every other day.     [provider]  azelastine (ASTELIN) 0.1 % nasal spray Place 1 spray into both nostrils daily as needed for rhinitis or allergies. Use in each nostril as directed    [provider]  Calcium Carbonate-Vitamin D (CALCIUM 600+D PO) Take 1 tablet by mouth 2 (two) times a day.    [provider]  enoxaparin (LOVENOX) 40 MG/0.4ML injection Inject 0.4 mLs (40 mg total) into the skin daily for 14 days. 03/28/19 04/11/19  Duanne Guess, PA-C  fluticasone (FLONASE) 50 MCG/ACT nasal spray Place 2 sprays into both nostrils daily.  [provider]  hydrocortisone (ANUSOL-HC) 25 MG suppository Place 25 mg rectally 2 (two) times daily as needed for hemorrhoids or anal itching.    [provider]  hypromellose (SYSTANE OVERNIGHT THERAPY) 0.3 % GEL ophthalmic ointment Place 1 application into both eyes at bedtime.    [provider]  levothyroxine (SYNTHROID, LEVOTHROID) 125 MCG tablet Take 125 mcg by mouth daily before breakfast.     [provider]  losartan (COZAAR) 25 MG tablet Take 25 mg by mouth daily.     [provider]  montelukast (SINGULAIR) 10 MG tablet Take 10 mg by mouth at bedtime.    [provider]   Multiple Vitamins-Minerals (CENTRUM SILVER PO) Take 1 tablet by mouth every evening.     [provider]  Multiple Vitamins-Minerals (PRESERVISION AREDS 2 PO) Take 1 capsule by mouth 2 (two) times daily.    [provider]  Polyethyl Glycol-Propyl Glycol (SYSTANE ULTRA OP) Place 1 drop into both eyes 2 (two) times daily as needed (dry eyes).    [provider]  predniSONE (DELTASONE) 20 MG tablet Take 3 tablets (60 mg total) by mouth daily with breakfast. Patient not taking: Reported on 03/19/2019 09/05/18   Henreitta Leber, MD  predniSONE (DELTASONE) 5 MG tablet Take 7.5 mg by mouth daily. 11/29/18   [provider]  Specialty Vitamins Products (BIOTIN PLUS KERATIN) 10000-100 MCG-MG TABS Take 1 tablet by mouth daily.    [provider]  spironolactone (ALDACTONE) 25 MG tablet Take 25 mg by mouth daily.    [provider]  Tocilizumab (ACTEMRA) 162 MG/0.9ML SOSY Inject 162 mg into the skin every Tuesday.    [provider]  traMADol (ULTRAM) 50 MG tablet Take 1 tablet (50 mg total) by mouth every 6 (six) hours. 03/28/19   Duanne Guess, PA-C    Allergies Codeine  Family History  Problem Relation Age of Onset  . CAD Father   . CAD Sister   . CAD Brother     Social History Social History   Tobacco Use  . Smoking status: Former Smoker    Types: Cigarettes    Quit date: 03/19/1981    Years since quitting: 38.1  . Smokeless tobacco: Never Used  Substance Use Topics  . Alcohol use: Yes    Alcohol/week: 3.0 standard drinks    Types: 1 Glasses of wine, 2 Shots of liquor per week    Comment: pt states one drink a day  . Drug use: No    Review of Systems  Constitutional: Negative for fever. Eyes: Negative for visual changes. ENT: Negative for sore throat. Neck: No neck pain  Cardiovascular: Negative for chest pain. Respiratory: Negative for shortness of breath. Gastrointestinal: + abdominal pain and nausea. No  vomiting or diarrhea. Genitourinary: Negative for dysuria. Musculoskeletal: Negative for back pain. Skin: Negative for rash. Neurological: Negative for headaches, weakness or numbness. Psych: No SI or HI  ____________________________________________   PHYSICAL EXAM:  VITAL SIGNS: ED Triage Vitals  Enc Vitals Group     BP 04/18/19 0421 (!) 166/68     Pulse Rate 04/18/19 0421 73     Resp 04/18/19 0421 (!) 22     Temp 04/18/19 0421 98 F (36.7 C)     Temp Source 04/18/19 0421 Oral     SpO2 04/18/19 0421 100 %     Weight 04/18/19 0405 119 lb (54 kg)     Height 04/18/19 0405 4\' 11"  (1.499 m)  Head Circumference --      Peak Flow --      Pain Score 04/18/19 0405 7     Pain Loc --      Pain Edu? --      Excl. in [place]? --     Constitutional: Alert and oriented, looks uncomfortable.  HEENT:      Head: Normocephalic and atraumatic.         Eyes: Conjunctivae are normal. Sclera is non-icteric.       Mouth/Throat: Mucous membranes are moist.       Neck: Supple with no signs of meningismus. Cardiovascular: Regular rate and rhythm. No murmurs, gallops, or rubs. 2+ symmetrical distal pulses are present in all extremities. No JVD. Respiratory: Normal respiratory effort. Lungs are clear to auscultation bilaterally. No wheezes, crackles, or rhonchi.  Gastrointestinal: Soft, diffusely tender to palpation, and non distended with positive bowel sounds. No rebound or guarding. Genitourinary: No CVA tenderness. Musculoskeletal: Nontender with normal range of motion in all extremities. No edema, cyanosis, or erythema of extremities. Neurologic: Normal speech and language. Face is symmetric. Moving all extremities. No gross focal neurologic deficits are appreciated. Skin: Skin is warm, dry and intact. No rash noted. Psychiatric: Mood and affect are normal. Speech and behavior are normal.  ____________________________________________   LABS (all labs ordered are listed, but only abnormal  results are displayed)  Labs Reviewed  COMPREHENSIVE METABOLIC PANEL - Abnormal; Notable for the following components:      Result Value   Potassium 3.2 (*)    Glucose, Bld 132 (*)    Total Protein 6.3 (*)    GFR calc non Af Amer 56 (*)    All other components within normal limits  LIPASE, BLOOD  CBC  URINALYSIS, COMPLETE (UACMP) WITH MICROSCOPIC  TROPONIN I (HIGH SENSITIVITY)   ____________________________________________  EKG  ED ECG REPORT I, Rudene Re, the attending physician, personally viewed and interpreted this ECG.  Normal sinus rhythm with occasional PVCs, rate of 74, normal intervals, normal axis, no STE or depressions. no significant changes when compared to prior. ____________________________________________  RADIOLOGY  I have personally reviewed the images performed during this visit and I agree with the Radiologist's read.   Interpretation by Radiologist:  Ct Abdomen Pelvis W Contrast  Result Date: 04/18/2019 CLINICAL DATA:  Sudden onset abdominal pain with vomiting. EXAM: CT ABDOMEN AND PELVIS WITH CONTRAST TECHNIQUE: Multidetector CT imaging of the abdomen and pelvis was performed using the standard protocol following bolus administration of intravenous contrast. CONTRAST:  65mL OMNIPAQUE IOHEXOL 300 MG/ML  SOLN COMPARISON:  07/19/09 FINDINGS: Lower chest:  Calcified granuloma at the right middle lobe. Hepatobiliary: No focal liver abnormality.No evidence of biliary obstruction or stone. Pancreas: Unremarkable. Spleen: Mild granulomatous calcification. Adrenals/Urinary Tract: Negative adrenals. No hydronephrosis or stone. Unremarkable bladder. Stomach/Bowel: Dilated small bowel with mesenteric edema leading to an abrupt transition point in the ventral pelvis on 2:50 where there is distortion of the small bowel mesentery, likely from underlying adhesion. Negative for bowel perforation. Bowel loops remain enhancing. Appendectomy. Colonic diverticulosis  Vascular/Lymphatic: Atherosclerotic plaque. No acute vascular abnormality. No mass or adenopathy. Reproductive:No pathologic findings. Other: No ascites or pneumoperitoneum. Musculoskeletal: Right hip arthroplasty. There is intermuscular fluid collection anterior to the arthroplasty correlating with recent surgery. No adjacent inflammatory changes. IMPRESSION: High-grade distal small bowel obstruction with mesenteric edema, likely due to adhesive disease. Electronically Signed   By: Monte Fantasia M.D.   On: 04/18/2019 06:23      ____________________________________________  PROCEDURES  Procedure(s) performed: None Procedures Critical Care performed:  none ____________________________________________   INITIAL IMPRESSION / ASSESSMENT AND PLAN / ED COURSE   85 y.o. female with a history of COPD, giant cell arteritis, hypertension, hypothyroidism who presents for evaluation of sudden onset severe diffuse abdominal pain associated with nausea.  Patient looks uncomfortable due to pain, vitals are within normal limits, abdomen is soft with no distention, she has significant tenderness throughout. Labs showing no leukocytosis, normal CMP and lipase.  UA is pending.  EKG and troponin showed no evidence of ischemia.  Differential diagnosis including kidney stone versus gallbladder versus diverticulitis versus SBO versus pyelonephritis versus pancreatitis versus peptic ulcer disease versus gastritis.  Will give fentanyl and Zofran for symptom relief.  Will get a CT abdomen.    _________________________ 6:31 AM on 04/18/2019 -----------------------------------------  CT positive for SBO. Will discuss with surgery for admission.   _________________________ 6:41 AM on 04/18/2019 -----------------------------------------  Discussed with Dr. Hampton Abbot who will admit patient.  He recommended putting a NG tube.  I spoke with patient's daughter-in-law on the phone.   As part of my medical decision  making, I reviewed the following data within the [place] notes reviewed and incorporated, Labs reviewed , EKG interpreted , Old EKG reviewed, Old chart reviewed, Radiograph reviewed , Discussed with admitting physician , Notes from prior ED visits and [place] Controlled Substance Database   Patient was evaluated in Emergency Department today for the symptoms described in the history of present illness. Patient was evaluated in the context of the global COVID-19 pandemic, which necessitated consideration that the patient might be at risk for infection with the SARS-CoV-2 virus that causes COVID-19. Institutional protocols and algorithms that pertain to the evaluation of patients at risk for COVID-19 are in a state of rapid change based on information released by regulatory bodies including the CDC and federal and state organizations. These policies and algorithms were followed during the patient's care in the ED.   ____________________________________________   FINAL CLINICAL IMPRESSION(S) / ED DIAGNOSES   Final diagnoses:  SBO (small bowel obstruction) ([place])      NEW MEDICATIONS STARTED DURING THIS VISIT:  ED Discharge Orders    None       Note:  This document was prepared using Dragon voice recognition software and may include unintentional dictation errors.    Alfred Levins, [place], MD 04/18/19 [phone]

## 2019-04-18 NOTE — ED Notes (Signed)
ED TO INPATIENT HANDOFF REPORT  ED Nurse Name and Phone #: [phone] Judson Roch and Quita Skye Name/Age/Gender Mary Barker 85 y.o. female Room/Bed: ED36A/ED36A  Code Status   Code Status: Full Code  Home/SNF/Other Home Patient oriented to: self, place, time and situation Is this baseline? Yes   Triage Complete: Triage complete  Chief Complaint Abdominal Pain ( EMS starting in triage)  Triage Note Pt presents to ED with sudden onset of generalized abd pain with nausea and vomiting around 2300 this evening. EMS VS:  97.4 temp 99% on RA 72 HR  169/76    Allergies Allergies  Allergen Reactions  . Codeine Nausea And Vomiting    Level of Care/Admitting Diagnosis ED Disposition    ED Disposition Condition [facility] Area: [place] [[id number]]  Level of Care: Med-Surg [16]  Covid Evaluation: N/A  Diagnosis: SBO (small bowel obstruction) [facility]) [[id number]]  Admitting Physician: Olean Ree [[id number]]  Attending Physician: Olean Ree [[id number]]  Estimated length of stay: 3 - 4 days  Certification:: I certify this patient will need inpatient services for at least 2 midnights  PT Class (Do Not Modify): Inpatient [101]  PT Acc Code (Do Not Modify): Private [1]       B Medical/Surgery History Past Medical History:  Diagnosis Date  . Carpal tunnel syndrome   . COPD (chronic obstructive pulmonary disease) ([place])   . Dyspnea   . Family history of adverse reaction to anesthesia    sister hard time waking up  . GCA (giant cell arteritis) ([place])   . H/O multiple pulmonary nodules   . Hypertension   . Hyponatremia   . Hypothyroidism   . Osteoarthritis   . Seasonal allergies   . Thyroid disease   . Tingling in extremities    Past Surgical History:  Procedure Laterality Date  . APPENDECTOMY    . ARTERY BIOPSY Left 07/31/2018   Procedure: BIOPSY TEMPORAL ARTERY;  Surgeon: Benjamine Sprague, DO;  Location: [facility] ORS;  Service: General;   Laterality: Left;  . BREAST EXCISIONAL BIOPSY Left 1954   neg  . BREAST SURGERY    . COLONOSCOPY    . DILATION AND CURETTAGE OF UTERUS    . EYE SURGERY Bilateral    cataract  . KNEE ARTHROSCOPY    . NASAL POLYP SURGERY    . TONSILLECTOMY    . TOTAL HIP ARTHROPLASTY Right 03/26/2019   Procedure: RIGHT TOTAL HIP ARTHROPLASTY;  Surgeon: Hessie Knows, MD;  Location: [facility] ORS;  Service: Orthopedics;  Laterality: Right;     A IV Location/Drains/Wounds Patient Lines/Drains/Airways Status   Active Line/Drains/Airways    Name:   Placement date:   Placement time:   Site:   Days:   Peripheral IV 04/18/19 Left Antecubital   04/18/19    0548    Antecubital   less than 1   Negative Pressure Wound Therapy Hip Right   03/26/19    0844    -   23   NG/OG Tube Nasogastric 16 Fr. Right nare Aucultation Documented cm marking at nare/ corner of mouth 58 cm   04/18/19    0656    Right nare   less than 1   Incision (Closed) 03/26/19 Hip Right   03/26/19    0853     23          Intake/Output Last 24 hours  Intake/Output Summary (Last 24 hours) at 04/18/2019 1139 Last data filed at 04/18/2019  0657 Gross per 24 hour  Intake -  Output 100 ml  Net -100 ml    Labs/Imaging Results for orders placed or performed during the hospital encounter of 04/18/19 (from the past 48 hour(s))  Lipase, blood     Status: None   Collection Time: 04/18/19  4:29 AM  Result Value Ref Range   Lipase 51 11 - 51 U/L    Comment: Performed at [facility] [address] [place], [place] [address]  Comprehensive metabolic panel     Status: Abnormal   Collection Time: 04/18/19  4:29 AM  Result Value Ref Range   Sodium 139 135 - 145 mmol/L   Potassium 3.2 (L) 3.5 - 5.1 mmol/L   Chloride 104 98 - 111 mmol/L   CO2 23 22 - 32 mmol/L   Glucose, Bld 132 (H) 70 - 99 mg/dL   BUN 22 8 - 23 mg/dL   Creatinine, Ser 0.92 0.44 - 1.00 mg/dL   Calcium 10.0 8.9 - 10.3 mg/dL   Total Protein 6.3 (L) 6.5 - 8.1 g/dL   Albumin  4.4 3.5 - 5.0 g/dL   AST 24 15 - 41 U/L   ALT 22 0 - 44 U/L   Alkaline Phosphatase 66 38 - 126 U/L   Total Bilirubin 1.0 0.3 - 1.2 mg/dL   GFR calc non Af Amer 56 (L) >60 mL/min   GFR calc Af Amer >60 >60 mL/min   Anion gap 12 5 - 15    Comment: Performed at [facility], [address] [place], [place] [address]  CBC     Status: None   Collection Time: 04/18/19  4:29 AM  Result Value Ref Range   WBC 7.2 4.0 - 10.5 K/uL   RBC 4.31 3.87 - 5.11 MIL/uL   Hemoglobin 14.2 12.0 - 15.0 g/dL   HCT 42.4 36.0 - 46.0 %   MCV 98.4 80.0 - 100.0 fL   MCH 32.9 26.0 - 34.0 pg   MCHC 33.5 30.0 - 36.0 g/dL   RDW 15.3 11.5 - 15.5 %   Platelets 224 150 - 400 K/uL   nRBC 0.0 0.0 - 0.2 %    Comment: Performed at [facility], [place], [place] [address]  Troponin I (High Sensitivity)     Status: None   Collection Time: 04/18/19  4:29 AM  Result Value Ref Range   Troponin I (High Sensitivity) 6 <18 ng/L    Comment: (NOTE) Elevated high sensitivity troponin I (hsTnI) values and significant  changes across serial measurements may suggest ACS but many other  chronic and acute conditions are known to elevate hsTnI results.  Refer to the "Links" section for chest pain algorithms and additional  guidance. Performed at [facility], [address] [place], [place] [address]   SARS Coronavirus 2 [facility] order, Performed in [facility] hospital lab) Nasopharyngeal Nasopharyngeal Swab     Status: None   Collection Time: 04/18/19  7:49 AM   Specimen: Nasopharyngeal Swab  Result Value Ref Range   SARS Coronavirus 2 NEGATIVE NEGATIVE    Comment: (NOTE) If result is NEGATIVE SARS-CoV-2 target nucleic acids are NOT DETECTED. The SARS-CoV-2 RNA is generally detectable in upper and lower  respiratory specimens during the acute phase of infection. The lowest  concentration of SARS-CoV-2 viral copies this assay can detect is 250  copies / mL. A negative result  does not preclude SARS-CoV-2 infection  and should not be used as the  sole basis for treatment or other  patient management decisions.  A negative result may occur with  improper specimen collection / handling, submission of specimen other  than nasopharyngeal swab, presence of viral mutation(s) within the  areas targeted by this assay, and inadequate number of viral copies  (<250 copies / mL). A negative result must be combined with clinical  observations, patient history, and epidemiological information. If result is POSITIVE SARS-CoV-2 target nucleic acids are DETECTED. The SARS-CoV-2 RNA is generally detectable in upper and lower  respiratory specimens dur ing the acute phase of infection.  Positive  results are indicative of active infection with SARS-CoV-2.  Clinical  correlation with patient history and other diagnostic information is  necessary to determine patient infection status.  Positive results do  not rule out bacterial infection or co-infection with other viruses. If result is PRESUMPTIVE POSTIVE SARS-CoV-2 nucleic acids MAY BE PRESENT.   A presumptive positive result was obtained on the submitted specimen  and confirmed on repeat testing.  While 2019 novel coronavirus  (SARS-CoV-2) nucleic acids may be present in the submitted sample  additional confirmatory testing may be necessary for epidemiological  and / or clinical management purposes  to differentiate between  SARS-CoV-2 and other Sarbecovirus currently known to infect humans.  If clinically indicated additional testing with an alternate test  methodology [phone]) is advised. The SARS-CoV-2 RNA is generally  detectable in upper and lower respiratory sp ecimens during the acute  phase of infection. The expected result is Negative. Fact Sheet for Patients:  StrictlyIdeas.no Fact Sheet for Healthcare Providers: BankingDealers.co.za This test is not yet approved or  cleared by the [place] FDA and has been authorized for detection and/or diagnosis of SARS-CoV-2 by FDA under an Emergency Use Authorization (EUA).  This EUA will remain in effect (meaning this test can be used) for the duration of the COVID-19 declaration under Section 564(b)(1) of the Act, 21 U.S.C. section 360bbb-3(b)(1), unless the authorization is terminated or revoked sooner. Performed at [facility], [address] [place], [place] [address]   Urinalysis, Complete w Microscopic     Status: Abnormal   Collection Time: 04/18/19  8:14 AM  Result Value Ref Range   Color, Urine STRAW (A) YELLOW   APPearance CLEAR (A) CLEAR   Specific Gravity, Urine 1.042 (H) 1.005 - 1.030   pH 8.0 5.0 - 8.0   Glucose, UA NEGATIVE NEGATIVE mg/dL   Hgb urine dipstick NEGATIVE NEGATIVE   Bilirubin Urine NEGATIVE NEGATIVE   Ketones, ur 5 (A) NEGATIVE mg/dL   Protein, ur NEGATIVE NEGATIVE mg/dL   Nitrite NEGATIVE NEGATIVE   Leukocytes,Ua NEGATIVE NEGATIVE   RBC / HPF 0-5 0 - 5 RBC/hpf   WBC, UA 0-5 0 - 5 WBC/hpf   Bacteria, UA NONE SEEN NONE SEEN   Squamous Epithelial / LPF 0-5 0 - 5    Comment: Performed at [facility], [address] [place], [place] [address]   Dg Abdomen 1 View  Result Date: 04/18/2019 CLINICAL DATA:  Nasogastric tube placement EXAM: ABDOMEN - 1 VIEW COMPARISON:  CT abdomen and pelvis April 18, 2019 FINDINGS: Nasogastric tube tip and side port are in the stomach. The visualized bowel does not appear appreciably dilated. No free air evident. Lung bases clear. IMPRESSION: Nasogastric tube tip and side port in stomach. Visualized bowel appears grossly unremarkable. No free air. Lung bases clear. Electronically Signed   By: Lowella Grip III M.D.   On: 04/18/2019 07:14  Ct Abdomen Pelvis W Contrast  Result Date: 04/18/2019 CLINICAL DATA:  Sudden onset abdominal pain with vomiting. EXAM: CT ABDOMEN AND PELVIS WITH CONTRAST TECHNIQUE: Multidetector CT  imaging of the abdomen and pelvis was performed using the standard protocol following bolus administration of intravenous contrast. CONTRAST:  46mL OMNIPAQUE IOHEXOL 300 MG/ML  SOLN COMPARISON:  07/19/09 FINDINGS: Lower chest:  Calcified granuloma at the right middle lobe. Hepatobiliary: No focal liver abnormality.No evidence of biliary obstruction or stone. Pancreas: Unremarkable. Spleen: Mild granulomatous calcification. Adrenals/Urinary Tract: Negative adrenals. No hydronephrosis or stone. Unremarkable bladder. Stomach/Bowel: Dilated small bowel with mesenteric edema leading to an abrupt transition point in the ventral pelvis on 2:50 where there is distortion of the small bowel mesentery, likely from underlying adhesion. Negative for bowel perforation. Bowel loops remain enhancing. Appendectomy. Colonic diverticulosis Vascular/Lymphatic: Atherosclerotic plaque. No acute vascular abnormality. No mass or adenopathy. Reproductive:No pathologic findings. Other: No ascites or pneumoperitoneum. Musculoskeletal: Right hip arthroplasty. There is intermuscular fluid collection anterior to the arthroplasty correlating with recent surgery. No adjacent inflammatory changes. IMPRESSION: High-grade distal small bowel obstruction with mesenteric edema, likely due to adhesive disease. Electronically Signed   By: Monte Fantasia M.D.   On: 04/18/2019 06:23    Pending Labs Unresulted Labs (From admission, onward)    Start     Ordered   04/19/19 9476  Basic metabolic panel  Tomorrow morning,   STAT     04/18/19 0647   04/19/19 0500  Magnesium  Tomorrow morning,   STAT     04/18/19 0647   04/19/19 0500  CBC WITH DIFFERENTIAL  Tomorrow morning,   STAT     04/18/19 0647          Vitals/Pain Today's Vitals   04/18/19 0815 04/18/19 0930 04/18/19 0934 04/18/19 1136  BP: (!) 181/68 (!) 185/89  (!) 187/69  Pulse: 70 65  78  Resp: 16 16  16   Temp:      TempSrc:      SpO2: 94% 94%  98%  Weight:      Height:       PainSc: 3   3      Isolation Precautions No active isolations  Medications Medications  HYDROmorphone (DILAUDID) injection 0.5 mg (0.5 mg Intravenous Given 04/18/19 0736)  polyethylene glycol (MIRALAX / GLYCOLAX) packet 17 g (has no administration in time range)  ondansetron (ZOFRAN-ODT) disintegrating tablet 4 mg ( Oral See Alternative 04/18/19 0735)    Or  ondansetron (ZOFRAN) injection 4 mg (4 mg Intravenous Given 04/18/19 0735)  pantoprazole (PROTONIX) injection 40 mg (has no administration in time range)  enoxaparin (LOVENOX) injection 40 mg (has no administration in time range)  dextrose 5 % and 0.45 % NaCl with KCl 20 mEq/L infusion ( Intravenous Transfusing/Transfer 04/18/19 1138)  ketorolac (TORADOL) 15 MG/ML injection 15 mg (has no administration in time range)  metoprolol tartrate (LOPRESSOR) injection 5 mg (5 mg Intravenous Given 04/18/19 0935)  hydrALAZINE (APRESOLINE) injection 5 mg (has no administration in time range)  predniSONE (DELTASONE) tablet 5 mg (has no administration in time range)  sodium chloride flush (NS) 0.9 % injection 3 mL (3 mLs Intravenous Given 04/18/19 0935)  ondansetron (ZOFRAN-ODT) disintegrating tablet 4 mg (4 mg Oral Given 04/18/19 0438)  fentaNYL (SUBLIMAZE) injection 50 mcg (50 mcg Intravenous Given 04/18/19 0551)  iohexol (OMNIPAQUE) 300 MG/ML solution 75 mL (75 mLs Intravenous Contrast Given 04/18/19 0607)  ketorolac (TORADOL) 30 MG/ML injection (30 mg  Given 04/18/19 0934)    Mobility walks  with person assist Moderate fall risk   Focused Assessments    R Recommendations: See Admitting Provider Note  Report given to:   Additional Notes: Surgeons placed NG suction at 70 continuous.

## 2019-04-19 ENCOUNTER — Inpatient Hospital Stay: Payer: PPO

## 2019-04-19 ENCOUNTER — Encounter: Payer: Self-pay | Admitting: Anesthesiology

## 2019-04-19 LAB — BASIC METABOLIC PANEL
Anion gap: 9 (ref 5–15)
BUN: 18 mg/dL (ref 8–23)
CO2: 21 mmol/L — ABNORMAL LOW (ref 22–32)
Calcium: 8.8 mg/dL — ABNORMAL LOW (ref 8.9–10.3)
Chloride: 107 mmol/L (ref 98–111)
Creatinine, Ser: 0.88 mg/dL (ref 0.44–1.00)
GFR calc Af Amer: >60 mL/min (ref >60)
GFR calc non Af Amer: 59 mL/min — ABNORMAL LOW (ref >60)
Glucose, Bld: 151 mg/dL — ABNORMAL HIGH (ref 70–99)
Potassium: 4.2 mmol/L (ref 3.5–5.1)
Sodium: 137 mmol/L (ref 135–145)

## 2019-04-19 LAB — MAGNESIUM: Magnesium: 1.9 mg/dL (ref 1.7–2.4)

## 2019-04-19 LAB — CBC WITH DIFFERENTIAL/PLATELET
Abs Immature Granulocytes: 0.12 10*3/uL — ABNORMAL HIGH (ref 0.00–0.07)
Basophils Absolute: 0.1 10*3/uL (ref 0.0–0.1)
Basophils Relative: 0 %
Eosinophils Absolute: 0.1 10*3/uL (ref 0.0–0.5)
Eosinophils Relative: 0 %
HCT: 44.7 % (ref 36.0–46.0)
Hemoglobin: 14.6 g/dL (ref 12.0–15.0)
Immature Granulocytes: 1 %
Lymphocytes Relative: 6 %
Lymphs Abs: 1.3 10*3/uL (ref 0.7–4.0)
MCH: 33.3 pg (ref 26.0–34.0)
MCHC: 32.7 g/dL (ref 30.0–36.0)
MCV: 101.8 fL — ABNORMAL HIGH (ref 80.0–100.0)
Monocytes Absolute: 1.6 10*3/uL — ABNORMAL HIGH (ref 0.1–1.0)
Monocytes Relative: 8 %
Neutro Abs: 16.5 10*3/uL — ABNORMAL HIGH (ref 1.7–7.7)
Neutrophils Relative %: 85 %
Platelets: 278 10*3/uL (ref 150–400)
RBC: 4.39 MIL/uL (ref 3.87–5.11)
RDW: 15.7 % — ABNORMAL HIGH (ref 11.5–15.5)
WBC: 19.6 10*3/uL — ABNORMAL HIGH (ref 4.0–10.5)
nRBC: 0 % (ref 0.0–0.2)

## 2019-04-19 MED ORDER — DIATRIZOATE MEGLUMINE & SODIUM 66-10 % PO SOLN
90.0000 mL | Freq: Once | ORAL | Status: AC
  Administered 2019-04-19: 90 mL via NASOGASTRIC

## 2019-04-19 MED ORDER — ALBUMIN HUMAN 25 % IV SOLN
12.5000 g | Freq: Once | INTRAVENOUS | Status: AC
  Administered 2019-04-19: 12.5 g via INTRAVENOUS
  Filled 2019-04-19: qty 50

## 2019-04-19 MED ORDER — LEVOTHYROXINE SODIUM 100 MCG/5ML IV SOLN
62.5000 ug | Freq: Every day | INTRAVENOUS | Status: DC
  Administered 2019-04-19 – 2019-04-22 (×3): 62.5 ug via INTRAVENOUS
  Filled 2019-04-19 (×4): qty 5

## 2019-04-19 NOTE — Progress Notes (Signed)
Ambulated patient around the unit twice - didn't want to go for a third round. Started to have nausea after sitting her in the chair after about half an hour. Administered zofran.

## 2019-04-19 NOTE — Progress Notes (Signed)
[facility] SURGICAL PROGRESS NOTE (cpt [phone])  Hospital Day(s): 1.   Interval History: Patient seen and examined, no acute events or new complaints overnight. Patient reports that she is feeling better this morning. She still has some lower abdominal discomfort. No fever, chills, nausea, or emesis. NGT output not recorded in chart however there is ~ 300 ccs in canister. She has not mobilized yet but wishes to do so. No reports of flatus. No other complaints.   Review of Systems:  Constitutional: denies fever, chills  HEENT: denies cough or congestion  Respiratory: denies any shortness of breath  Cardiovascular: denies chest pain or palpitations  Gastrointestinal: + abdominal pain (improved), denied N/V, or diarrhea/and bowel function as per interval history Genitourinary: denies burning with urination or urinary frequency   Vital signs in last 24 hours: [min-max] current  Temp:  [97.4 F (36.3 C)-98.8 F (37.1 C)] 98.8 F (37.1 C) (08/14 0517) Pulse Rate:  [65-86] 86 (08/14 0517) Resp:  [16-20] 20 (08/14 0517) BP: (123-187)/(65-89) 136/69 (08/14 0517) SpO2:  [94 %-99 %] 99 % (08/14 0517)     Height: 4\' 11"  (149.9 cm) Weight: 54 kg BMI (Calculated): 24.02   Intake/Output last 2 shifts:  08/13 0701 - 08/14 0700 In: 531.8 [I.V.:531.8] Out: 200 [Urine:200]   Physical Exam:  Constitutional: alert, cooperative and no distress  HENT: normocephalic without obvious abnormality, NGT in place  Eyes: PERRL, EOM's grossly intact and symmetric  Respiratory: breathing non-labored at rest  Gastrointestinal: soft, tenderness in LLQ, and non-distended, no rebound/guarding, no peritonitis Musculoskeletal: no edema or wounds, motor and sensation grossly intact, NT    Labs:  CBC Latest Ref Rng & Units 04/18/2019 03/28/2019 03/27/2019  WBC 4.0 - 10.5 K/uL 7.2 12.9(H) 11.5(H)  Hemoglobin 12.0 - 15.0 g/dL 14.2 9.6(L) 10.6(L)  Hematocrit 36.0 - 46.0 % 42.4 29.5(L) 32.5(L)   Platelets 150 - 400 K/uL 224 158 160   CMP Latest Ref Rng & Units 04/18/2019 03/28/2019 03/27/2019  Glucose 70 - 99 mg/dL 132(H) 101(H) 131(H)  BUN 8 - 23 mg/dL 22 26(H) 22  Creatinine 0.44 - 1.00 mg/dL 0.92 0.81 0.74  Sodium 135 - 145 mmol/L 139 136 139  Potassium 3.5 - 5.1 mmol/L 3.2(L) 4.1 4.1  Chloride 98 - 111 mmol/L 104 105 109  CO2 22 - 32 mmol/L 23 28 23   Calcium 8.9 - 10.3 mg/dL 10.0 8.3(L) 8.0(L)  Total Protein 6.5 - 8.1 g/dL 6.3(L) - -  Total Bilirubin 0.3 - 1.2 mg/dL 1.0 - -  Alkaline Phos 38 - 126 U/L 66 - -  AST 15 - 41 U/L 24 - -  ALT 0 - 44 U/L 22 - -     Imaging studies:   KUB (04/19/2019) personally reviewed which is improved but still with dilated loops of bowel in LLQ without free air, and radiologist interpretation reviewed:  IMPRESSION: Improving small bowel dilatation with a few residual loops in the left mid abdomen.   Assessment/Plan: (ICD-10's: K1.609) 85 y.o. female with improved abdomina pain but no evidence of bowel function return still with dilated loops in the LLQ on KUB this morning attributable to small bowel obstruction with transition point in the pelvis attributable to post-surgical adhesions following previous appendectomy, complicated by hypokalemia and advanced age.     - Continue NPO + IVF  - Continue NGT decompression on low intermittent suction; monitor output  - Given no bowel function return will give gastrografin and obtain 8 & 24 hour delay KUBs  -  pain control prn (minimize narcotics); antiemetics prn  - monitor abdominal examination; on-going bowel function  - follow up labs pending this morning  - mobilization encouraged  - medical management of comorbidities  - DVT prophylaxis   All of the above findings and recommendations were discussed with the patient, and the medical team, and all of patient's questions were answered to her expressed satisfaction.  -- Edison Simon, PA-C [facility] 04/19/2019, 7:48  AM [phone] M-F: 7am - 4pm

## 2019-04-19 NOTE — Progress Notes (Signed)
Administered Gastrografin at 0845, clamped NGT for one hour and restarted LIWS after the hour ended. Informed Radiology after administered medication.

## 2019-04-20 ENCOUNTER — Inpatient Hospital Stay: Payer: PPO | Admitting: Anesthesiology

## 2019-04-20 ENCOUNTER — Inpatient Hospital Stay: Payer: PPO

## 2019-04-20 ENCOUNTER — Encounter
Admission: RE | Disposition: A | Discharge status: Home / Self Care | Payer: Self-pay | Source: Home / Self Care | Attending: Surgery

## 2019-04-20 HISTORY — PX: LAPAROTOMY: SHX154

## 2019-04-20 LAB — COMPREHENSIVE METABOLIC PANEL
ALT: 15 U/L (ref 0–44)
AST: 22 U/L (ref 15–41)
Albumin: 3.5 g/dL (ref 3.5–5.0)
Alkaline Phosphatase: 47 U/L (ref 38–126)
Anion gap: 6 (ref 5–15)
BUN: 15 mg/dL (ref 8–23)
CO2: 25 mmol/L (ref 22–32)
Calcium: 8.5 mg/dL — ABNORMAL LOW (ref 8.9–10.3)
Chloride: 104 mmol/L (ref 98–111)
Creatinine, Ser: 0.84 mg/dL (ref 0.44–1.00)
GFR calc Af Amer: >60 mL/min (ref >60)
GFR calc non Af Amer: >60 mL/min (ref >60)
Glucose, Bld: 125 mg/dL — ABNORMAL HIGH (ref 70–99)
Potassium: 3.9 mmol/L (ref 3.5–5.1)
Sodium: 135 mmol/L (ref 135–145)
Total Bilirubin: 0.7 mg/dL (ref 0.3–1.2)
Total Protein: 5.1 g/dL — ABNORMAL LOW (ref 6.5–8.1)

## 2019-04-20 LAB — CBC
HCT: 38.4 % (ref 36.0–46.0)
Hemoglobin: 12.6 g/dL (ref 12.0–15.0)
MCH: 33.7 pg (ref 26.0–34.0)
MCHC: 32.8 g/dL (ref 30.0–36.0)
MCV: 102.7 fL — ABNORMAL HIGH (ref 80.0–100.0)
Platelets: 232 10*3/uL (ref 150–400)
RBC: 3.74 MIL/uL — ABNORMAL LOW (ref 3.87–5.11)
RDW: 15.3 % (ref 11.5–15.5)
WBC: 16.1 10*3/uL — ABNORMAL HIGH (ref 4.0–10.5)
nRBC: 0 % (ref 0.0–0.2)

## 2019-04-20 SURGERY — LAPAROTOMY, EXPLORATORY
Anesthesia: General

## 2019-04-20 MED ORDER — ROCURONIUM BROMIDE 100 MG/10ML IV SOLN
INTRAVENOUS | Status: DC | PRN
  Administered 2019-04-20: 20 mg via INTRAVENOUS
  Administered 2019-04-20: 30 mg via INTRAVENOUS

## 2019-04-20 MED ORDER — SUCCINYLCHOLINE CHLORIDE 20 MG/ML IJ SOLN
INTRAMUSCULAR | Status: AC
  Filled 2019-04-20: qty 1

## 2019-04-20 MED ORDER — ACETAMINOPHEN 10 MG/ML IV SOLN
1000.0000 mg | Freq: Four times a day (QID) | INTRAVENOUS | Status: AC
  Administered 2019-04-20 – 2019-04-21 (×4): 1000 mg via INTRAVENOUS
  Filled 2019-04-20 (×4): qty 100

## 2019-04-20 MED ORDER — BUPIVACAINE-EPINEPHRINE (PF) 0.25% -1:200000 IJ SOLN
INTRAMUSCULAR | Status: DC | PRN
  Administered 2019-04-20: 30 mL

## 2019-04-20 MED ORDER — ONDANSETRON HCL 4 MG/2ML IJ SOLN
INTRAMUSCULAR | Status: DC | PRN
  Administered 2019-04-20: 4 mg via INTRAVENOUS

## 2019-04-20 MED ORDER — LIDOCAINE HCL (PF) 2 % IJ SOLN
INTRAMUSCULAR | Status: AC
  Filled 2019-04-20: qty 10

## 2019-04-20 MED ORDER — ROCURONIUM BROMIDE 50 MG/5ML IV SOLN
INTRAVENOUS | Status: AC
  Filled 2019-04-20: qty 1

## 2019-04-20 MED ORDER — SUGAMMADEX SODIUM 200 MG/2ML IV SOLN
INTRAVENOUS | Status: AC
  Filled 2019-04-20: qty 2

## 2019-04-20 MED ORDER — SODIUM CHLORIDE FLUSH 0.9 % IV SOLN
INTRAVENOUS | Status: AC
  Filled 2019-04-20: qty 50

## 2019-04-20 MED ORDER — SODIUM CHLORIDE 0.9 % IV SOLN
INTRAVENOUS | Status: DC | PRN
  Administered 2019-04-20: 70 mL

## 2019-04-20 MED ORDER — PROPOFOL 10 MG/ML IV BOLUS
INTRAVENOUS | Status: DC | PRN
  Administered 2019-04-20: 130 mg via INTRAVENOUS

## 2019-04-20 MED ORDER — PROMETHAZINE HCL 25 MG/ML IJ SOLN: 6.2500 mg | INTRAMUSCULAR | Status: DC | PRN

## 2019-04-20 MED ORDER — ACETAMINOPHEN 160 MG/5ML PO SOLN
325.0000 mg | ORAL | Status: DC | PRN
  Filled 2019-04-20: qty 20.3

## 2019-04-20 MED ORDER — MEPERIDINE HCL 50 MG/ML IJ SOLN: 6.2500 mg | INTRAMUSCULAR | Status: DC | PRN

## 2019-04-20 MED ORDER — ACETAMINOPHEN 10 MG/ML IV SOLN
INTRAVENOUS | Status: AC
  Filled 2019-04-20: qty 100

## 2019-04-20 MED ORDER — TRAMADOL HCL 50 MG PO TABS
50.0000 mg | ORAL_TABLET | Freq: Four times a day (QID) | ORAL | Status: DC | PRN
  Administered 2019-04-20 – 2019-04-22 (×3): 50 mg via ORAL
  Filled 2019-04-20 (×3): qty 1

## 2019-04-20 MED ORDER — ACETAMINOPHEN 325 MG PO TABS: 325.0000 mg | ORAL_TABLET | ORAL | Status: DC | PRN

## 2019-04-20 MED ORDER — FENTANYL CITRATE (PF) 100 MCG/2ML IJ SOLN
25.0000 ug | INTRAMUSCULAR | Status: DC | PRN
  Administered 2019-04-20: 25 ug via INTRAVENOUS
  Administered 2019-04-20: 50 ug via INTRAVENOUS
  Administered 2019-04-20: 25 ug via INTRAVENOUS

## 2019-04-20 MED ORDER — LIDOCAINE HCL (CARDIAC) PF 100 MG/5ML IV SOSY
PREFILLED_SYRINGE | INTRAVENOUS | Status: DC | PRN
  Administered 2019-04-20: 80 mg via INTRAVENOUS

## 2019-04-20 MED ORDER — DEXAMETHASONE SODIUM PHOSPHATE 10 MG/ML IJ SOLN
INTRAMUSCULAR | Status: AC
  Filled 2019-04-20: qty 1

## 2019-04-20 MED ORDER — PHENYLEPHRINE HCL (PRESSORS) 10 MG/ML IV SOLN
INTRAVENOUS | Status: AC
  Filled 2019-04-20: qty 1

## 2019-04-20 MED ORDER — SUCCINYLCHOLINE CHLORIDE 20 MG/ML IJ SOLN
INTRAMUSCULAR | Status: DC | PRN
  Administered 2019-04-20: 80 mg via INTRAVENOUS

## 2019-04-20 MED ORDER — FENTANYL CITRATE (PF) 100 MCG/2ML IJ SOLN
INTRAMUSCULAR | Status: AC
  Filled 2019-04-20: qty 2

## 2019-04-20 MED ORDER — PHENOL 1.4 % MT LIQD
1.0000 | OROMUCOSAL | Status: DC | PRN
  Administered 2019-04-20: 1 via OROMUCOSAL
  Filled 2019-04-20: qty 177

## 2019-04-20 MED ORDER — ONDANSETRON HCL 4 MG/2ML IJ SOLN
INTRAMUSCULAR | Status: AC
  Filled 2019-04-20: qty 2

## 2019-04-20 MED ORDER — SODIUM CHLORIDE 0.9 % IV SOLN
1.0000 g | Freq: Two times a day (BID) | INTRAVENOUS | Status: DC
  Administered 2019-04-20 – 2019-04-22 (×4): 1 g via INTRAVENOUS
  Filled 2019-04-20 (×7): qty 1

## 2019-04-20 MED ORDER — PHENYLEPHRINE HCL (PRESSORS) 10 MG/ML IV SOLN
INTRAVENOUS | Status: DC | PRN
  Administered 2019-04-20: 100 ug via INTRAVENOUS

## 2019-04-20 MED ORDER — FENTANYL CITRATE (PF) 100 MCG/2ML IJ SOLN
INTRAMUSCULAR | Status: DC | PRN
  Administered 2019-04-20: 25 ug via INTRAVENOUS
  Administered 2019-04-20 (×2): 50 ug via INTRAVENOUS
  Administered 2019-04-20: 25 ug via INTRAVENOUS

## 2019-04-20 MED ORDER — SUGAMMADEX SODIUM 200 MG/2ML IV SOLN
INTRAVENOUS | Status: DC | PRN
  Administered 2019-04-20: 200 mg via INTRAVENOUS

## 2019-04-20 MED ORDER — PROPOFOL 10 MG/ML IV BOLUS
INTRAVENOUS | Status: AC
  Filled 2019-04-20: qty 20

## 2019-04-20 MED ORDER — BUPIVACAINE-EPINEPHRINE (PF) 0.25% -1:200000 IJ SOLN
INTRAMUSCULAR | Status: AC
  Filled 2019-04-20: qty 30

## 2019-04-20 MED ORDER — DEXAMETHASONE SODIUM PHOSPHATE 10 MG/ML IJ SOLN
INTRAMUSCULAR | Status: DC | PRN
  Administered 2019-04-20: 10 mg via INTRAVENOUS

## 2019-04-20 MED ORDER — ACETAMINOPHEN 10 MG/ML IV SOLN
INTRAVENOUS | Status: DC | PRN
  Administered 2019-04-20: 1000 mg via INTRAVENOUS

## 2019-04-20 MED ORDER — LACTATED RINGERS IV SOLN
INTRAVENOUS | Status: DC | PRN
  Administered 2019-04-20: 10:00:00 via INTRAVENOUS

## 2019-04-20 MED ORDER — EPHEDRINE SULFATE 50 MG/ML IJ SOLN
INTRAMUSCULAR | Status: AC
  Filled 2019-04-20: qty 1

## 2019-04-20 MED ORDER — BUPIVACAINE LIPOSOME 1.3 % IJ SUSP
INTRAMUSCULAR | Status: AC
  Filled 2019-04-20: qty 20

## 2019-04-20 SURGICAL SUPPLY — 53 items
APL PRP STRL LF DISP 70% ISPRP (MISCELLANEOUS) ×1
APPLIER CLIP 11 MED OPEN (CLIP)
APPLIER CLIP 13 LRG OPEN (CLIP)
APR CLP LRG 13 20 CLIP (CLIP)
APR CLP MED 11 20 MLT OPN (CLIP)
BARRIER ADH SEPRAFILM 3INX5IN (MISCELLANEOUS) IMPLANT
BLADE CLIPPER SURG (BLADE) ×2 IMPLANT
BLADE SURG SZ10 CARB STEEL (BLADE) ×2 IMPLANT
BRR ADH 5X3 SEPRAFILM 2 SHT (MISCELLANEOUS)
CANISTER SUCT 3000ML PPV (MISCELLANEOUS) ×2 IMPLANT
CHLORAPREP W/TINT 26 (MISCELLANEOUS) ×2 IMPLANT
CLIP APPLIE 11 MED OPEN (CLIP) ×1 IMPLANT
CLIP APPLIE 13 LRG OPEN (CLIP) ×1 IMPLANT
COVER BACK TABLE REUSABLE LG (DRAPES) ×1 IMPLANT
COVER WAND RF STERILE (DRAPES) ×1 IMPLANT
DRAPE LAPAROTOMY 100X77 ABD (DRAPES) ×2 IMPLANT
DRSG OPSITE POSTOP 4X10 (GAUZE/BANDAGES/DRESSINGS) ×1 IMPLANT
DRSG TEGADERM 2-3/8X2-3/4 SM (GAUZE/BANDAGES/DRESSINGS) ×2 IMPLANT
DRSG TELFA 3X8 NADH (GAUZE/BANDAGES/DRESSINGS) ×2 IMPLANT
ELECT BLADE 6.5 EXT (BLADE) ×2 IMPLANT
ELECT REM PT RETURN 9FT ADLT (ELECTROSURGICAL) ×2
ELECTRODE REM PT RTRN 9FT ADLT (ELECTROSURGICAL) ×1 IMPLANT
GAUZE SPONGE 4X4 12PLY STRL (GAUZE/BANDAGES/DRESSINGS) ×4 IMPLANT
GLOVE BIO SURGEON STRL SZ7 (GLOVE) ×2 IMPLANT
GOWN STRL REUS W/ TWL LRG LVL3 (GOWN DISPOSABLE) ×2 IMPLANT
GOWN STRL REUS W/TWL LRG LVL3 (GOWN DISPOSABLE) ×4
HANDLE SUCTION POOLE (INSTRUMENTS) ×1 IMPLANT
HANDLE YANKAUER SUCT BULB TIP (MISCELLANEOUS) ×2 IMPLANT
LIGASURE IMPACT 36 18CM CVD LR (INSTRUMENTS) IMPLANT
NDL HYPO 25X1 1.5 SAFETY (NEEDLE) ×1 IMPLANT
NEEDLE HYPO 22GX1.5 SAFETY (NEEDLE) ×3 IMPLANT
NEEDLE HYPO 25X1 1.5 SAFETY (NEEDLE) IMPLANT
PACK BASIN MAJOR ARMC (MISCELLANEOUS) ×2 IMPLANT
PAD DRESSING TELFA 3X8 NADH (GAUZE/BANDAGES/DRESSINGS) ×1 IMPLANT
RELOAD PROXIMATE 75MM BLUE (ENDOMECHANICALS) ×6 IMPLANT
RELOAD STAPLE 75 3.8 BLU REG (ENDOMECHANICALS) IMPLANT
SPONGE LAP 18X18 RF (DISPOSABLE) ×1 IMPLANT
SPONGE LAP 18X36 RFD (DISPOSABLE) IMPLANT
STAPLER PROXIMATE 75MM BLUE (STAPLE) ×1 IMPLANT
STAPLER SKIN PROX 35W (STAPLE) ×2 IMPLANT
SUCTION POOLE HANDLE (INSTRUMENTS)
SUT PDS AB 0 CT1 27 (SUTURE) ×6 IMPLANT
SUT SILK 2 0 (SUTURE) ×2
SUT SILK 2 0 SH CR/8 (SUTURE) ×2 IMPLANT
SUT SILK 2 0SH CR/8 30 (SUTURE) ×2 IMPLANT
SUT SILK 2-0 18XBRD TIE 12 (SUTURE) ×1 IMPLANT
SUT VIC AB 0 CT1 36 (SUTURE) ×4 IMPLANT
SUT VIC AB 2-0 SH 27 (SUTURE) ×4
SUT VIC AB 2-0 SH 27XBRD (SUTURE) ×2 IMPLANT
SYR 30ML LL (SYRINGE) ×4 IMPLANT
SYR 3ML LL SCALE MARK (SYRINGE) ×1 IMPLANT
TAPE MICROFOAM 4IN (TAPE) ×2 IMPLANT
TRAY FOLEY MTR SLVR 16FR STAT (SET/KITS/TRAYS/PACK) ×2 IMPLANT

## 2019-04-20 NOTE — Anesthesia Post-op Follow-up Note (Signed)
Anesthesia QCDR form completed.        

## 2019-04-20 NOTE — Transfer of Care (Signed)
Immediate Anesthesia Transfer of Care Note  Patient: Mary Barker  Procedure(s) Performed: Procedure(s): EXPLORATORY LAPAROTOMY Small Bowel Obstruction (N/A)  Patient Location: PACU  Anesthesia Type:General  Level of Consciousness: sedated  Airway & Oxygen Therapy: Patient Spontanous Breathing and Patient connected to face mask oxygen  Post-op Assessment: Report given to RN and Post -op Vital signs reviewed and stable  Post vital signs: Reviewed and stable  Last Vitals:  Vitals:   04/20/19 0450 04/20/19 1115  BP: (!) 145/59 (!) 148/70  Pulse: 85   Resp: 16   Temp: 36.9 C 36.9 C  SpO2: 58% 52%    Complications: No apparent anesthesia complications

## 2019-04-20 NOTE — Anesthesia Postprocedure Evaluation (Signed)
Anesthesia Post Note  Patient: Mary Barker  Procedure(s) Performed: EXPLORATORY LAPAROTOMY Small Bowel Obstruction (N/A )  Patient location during evaluation: PACU Anesthesia Type: General Level of consciousness: awake and alert Pain management: pain level controlled Vital Signs Assessment: post-procedure vital signs reviewed and stable Respiratory status: spontaneous breathing, nonlabored ventilation, respiratory function stable and patient connected to nasal cannula oxygen Cardiovascular status: blood pressure returned to baseline and stable Postop Assessment: no apparent nausea or vomiting Anesthetic complications: no     Last Vitals:  Vitals:   04/20/19 1200 04/20/19 1230  BP: 137/65 135/62  Pulse: 81 81  Resp: 15   Temp: 36.7 C 36.8 C  SpO2: 97% 97%    Last Pain:  Vitals:   04/20/19 1230  TempSrc: Oral  PainSc: 2                  Alphonsus Sias

## 2019-04-20 NOTE — Progress Notes (Signed)
Pt seen and examined. SBO not responding to NGT decompression. KUB personally reviewed and d/w radiology persistent sentinel loop of dilated SB, WBC still high. On exam she is NAD, alert Abd: Distended, decrease bs, soft but w rebound. Given all the findings I definitely recommend laparotomy today. Procedure d/w the pt and son in detail. Risks, benefits and possible complications ( she is 7, steroid and decrease mobility due to a recent hip fx) I think delaying an intervention will increase her morbidity and potential mortality.

## 2019-04-20 NOTE — Progress Notes (Signed)
Xray gastrogafin challenge pers. Reviewed, c/w obstruction. D/W Son and pt and we will check another film this am. IF contrast does not reach colon we will proceed w laparotomy. D/W them in detail. We will tentatively place her in the schedule later today.

## 2019-04-20 NOTE — Op Note (Signed)
PROCEDURES: 1. Laparotomy w reduction of internal hernia ( lysing single band) and small bowel resection with stapled anastomosis   Pre-operative Diagnosis: SBO  Post-operative Diagnosis: same  Surgeon: [place]     Anesthesia: General endotracheal anesthesia  ASA Class: 3   Surgeon: Caroleen Hamman , MD FACS  Anesthesia: Gen. with endotracheal tube   Findings: Internal hernia caused by a single band, mid jejunal loop with ischemia ( closed loop obstruction) from hernia requiring bowel resection Ascites  Estimated Blood Loss: 20cc         Drains: none         Specimens: small bowel          Complications: none         Condition: stable  Procedure Details  The patient was seen again in the Holding Room. The benefits, complications, treatment options, and expected outcomes were discussed with the patient. The risks of bleeding, infection, recurrence of symptoms, failure to resolve symptoms,  bowel injury, any of which could require further surgery were reviewed with the patient.   The patient was taken to Operating Room, identified as SYRITA DOVEL and the procedure verified.  A Time Out was held and the above information confirmed.  Prior to the induction of general anesthesia, antibiotic prophylaxis was administered. VTE prophylaxis was in place. General endotracheal anesthesia was then administered and tolerated well. After the induction, the abdomen was prepped with Chloraprep and draped in the sterile fashion. The patient was positioned in the supine position.  The laparotomy was performed in the standard fashion and the abdominal cavity was entered.  Fascia was incised with electrocautery.  Exploration revealed evidence of an obvious transition point from an internal hernia caused by a single adhesive band, this was causing a closed-loop obstruction..  I lysed the band with electrocautery and reduce the hernia in the standard fashion.  I waited for a few minutes and  the small bowel still look ischemic.  Decision was made to perform a small bowel resection and a window on both proximal and distal small bowel was created and the bowel was resected in the standard fashion with a 75 GIA stapler.  I created a side-to-side functional end-to-end anastomosis using the standard 75 GIA stapler.  Mesenteric defect was closed with a running 2-0 Vicryl.  Abdominal cavity was irrigated in the standard fashion.  There was good hemostasis and no other intra-abdominal pathology..  Liposomal Marcaine was injected throughout the abdominal wall for postoperative analgesia.  The fascia was closed with a running 0 PDS in the standard fashion.  Skin was closed with staples.  Needle and laparotomy count were correct and there were no immediate occasions  Caroleen Hamman, MD, FACS

## 2019-04-20 NOTE — Anesthesia Procedure Notes (Addendum)
Procedure Name: Intubation Date/Time: 04/20/2019 9:47 AM Performed by: Doreen Salvage, CRNA Pre-anesthesia Checklist: Patient identified, Patient being monitored, Timeout performed, Emergency Drugs available and Suction available Patient Re-evaluated:Patient Re-evaluated prior to induction Oxygen Delivery Method: Circle system utilized Preoxygenation: Pre-oxygenation with 100% oxygen Induction Type: IV induction, Rapid sequence and Cricoid Pressure applied Laryngoscope Size: Mac and 3 Grade View: Grade I Tube type: Oral Tube size: 7.0 mm Number of attempts: 1 Airway Equipment and Method: Stylet Placement Confirmation: ETT inserted through vocal cords under direct vision,  positive ETCO2 and breath sounds checked- equal and bilateral Secured at: 21 cm Tube secured with: Tape Dental Injury: Teeth and Oropharynx as per pre-operative assessment

## 2019-04-20 NOTE — Anesthesia Preprocedure Evaluation (Addendum)
Anesthesia Evaluation  Patient identified by MRN, date of birth, ID band Patient awake    Reviewed: Allergy & Precautions, H&P , NPO status , reviewed documented beta blocker date and time   History of Anesthesia Complications (+) Family history of anesthesia reaction  Airway Mallampati: II  TM Distance: >3 FB Neck ROM: full    Dental  (+) Teeth Intact   Pulmonary shortness of breath, pneumonia, resolved, COPD, former smoker,    Pulmonary exam normal        Cardiovascular hypertension, Normal cardiovascular exam  Occ PVC on EKG   Neuro/Psych  Neuromuscular disease    GI/Hepatic   Endo/Other  Hypothyroidism   Renal/GU      Musculoskeletal  (+) Arthritis ,   Abdominal   Peds  Hematology   Anesthesia Other Findings Past Medical History: No date: Carpal tunnel syndrome No date: COPD (chronic obstructive pulmonary disease) (HCC) No date: Dyspnea No date: Family history of adverse reaction to anesthesia     Comment:  sister hard time waking up No date: GCA (giant cell arteritis) ([place]) No date: H/O multiple pulmonary nodules No date: Hypertension No date: Hyponatremia No date: Hypothyroidism No date: Osteoarthritis No date: Seasonal allergies No date: Thyroid disease No date: Tingling in extremities Past Surgical History: No date: APPENDECTOMY 07/31/2018: ARTERY BIOPSY; Left     Comment:  Procedure: BIOPSY TEMPORAL ARTERY;  Surgeon: Benjamine Sprague, DO;  Location: [facility] ORS;  Service: General;                Laterality: Left; 1954: BREAST EXCISIONAL BIOPSY; Left     Comment:  neg No date: BREAST SURGERY No date: COLONOSCOPY No date: DILATION AND CURETTAGE OF UTERUS No date: EYE SURGERY; Bilateral     Comment:  cataract No date: KNEE ARTHROSCOPY No date: NASAL POLYP SURGERY No date: TONSILLECTOMY 03/26/2019: TOTAL HIP ARTHROPLASTY; Right     Comment:  Procedure: RIGHT TOTAL HIP ARTHROPLASTY;   Surgeon: Hessie Knows, MD;  Location: [facility] ORS;  Service: Orthopedics;               Laterality: Right; BMI    Body Mass Index: 24.04 kg/m     Reproductive/Obstetrics                            Anesthesia Physical Anesthesia Plan  ASA: III and emergent  Anesthesia Plan: General   Post-op Pain Management:    Induction: Intravenous  PONV Risk Score and Plan: 4 or greater and Ondansetron, Dexamethasone and Treatment may vary due to age or medical condition  Airway Management Planned: Oral ETT  Additional Equipment:   Intra-op Plan:   Post-operative Plan: Extubation in OR  Informed Consent: I have reviewed the patients History and Physical, chart, labs and discussed the procedure including the risks, benefits and alternatives for the proposed anesthesia with the patient or authorized representative who has indicated his/her understanding and acceptance.     Dental Advisory Given  Plan Discussed with: CRNA  Anesthesia Plan Comments:         Anesthesia Quick Evaluation

## 2019-04-21 ENCOUNTER — Encounter: Payer: Self-pay | Admitting: Surgery

## 2019-04-21 LAB — CBC
HCT: 33.9 % — ABNORMAL LOW (ref 36.0–46.0)
Hemoglobin: 11.2 g/dL — ABNORMAL LOW (ref 12.0–15.0)
MCH: 33.2 pg (ref 26.0–34.0)
MCHC: 33 g/dL (ref 30.0–36.0)
MCV: 100.6 fL — ABNORMAL HIGH (ref 80.0–100.0)
Platelets: 189 10*3/uL (ref 150–400)
RBC: 3.37 MIL/uL — ABNORMAL LOW (ref 3.87–5.11)
RDW: 14.8 % (ref 11.5–15.5)
WBC: 17.7 10*3/uL — ABNORMAL HIGH (ref 4.0–10.5)
nRBC: 0 % (ref 0.0–0.2)

## 2019-04-21 LAB — BASIC METABOLIC PANEL
Anion gap: 4 — ABNORMAL LOW (ref 5–15)
BUN: 13 mg/dL (ref 8–23)
CO2: 23 mmol/L (ref 22–32)
Calcium: 7.6 mg/dL — ABNORMAL LOW (ref 8.9–10.3)
Chloride: 104 mmol/L (ref 98–111)
Creatinine, Ser: 0.85 mg/dL (ref 0.44–1.00)
GFR calc Af Amer: >60 mL/min (ref >60)
GFR calc non Af Amer: >60 mL/min (ref >60)
Glucose, Bld: 130 mg/dL — ABNORMAL HIGH (ref 70–99)
Potassium: 4.2 mmol/L (ref 3.5–5.1)
Sodium: 131 mmol/L — ABNORMAL LOW (ref 135–145)

## 2019-04-21 MED ORDER — FUROSEMIDE 10 MG/ML IJ SOLN
20.0000 mg | Freq: Once | INTRAMUSCULAR | Status: AC
  Administered 2019-04-21: 20 mg via INTRAVENOUS
  Filled 2019-04-21: qty 4

## 2019-04-21 MED ORDER — AZELASTINE HCL 0.1 % NA SOLN
1.0000 | NASAL | Status: DC | PRN
  Administered 2019-04-21 – 2019-04-23 (×4): 1 via NASAL
  Filled 2019-04-21: qty 30

## 2019-04-21 MED ORDER — FLUTICASONE PROPIONATE 50 MCG/ACT NA SUSP
1.0000 | Freq: Every day | NASAL | Status: DC
  Administered 2019-04-21 – 2019-04-24 (×4): 1 via NASAL
  Filled 2019-04-21: qty 16

## 2019-04-21 NOTE — Progress Notes (Signed)
POD # 1 Doing well soe congestion avss Labs ok  PE NAD Abd: soft, dressing saturated. Oozing started after lovenox was initiated. No peirtonitis or infection. Dressing removed no expanding hematoma or active bleeding\  A/P Doing well Stop lovenox as this made her wound bleed Stop toradol Mobilize ngt clamping trial

## 2019-04-22 LAB — BASIC METABOLIC PANEL
Anion gap: 10 (ref 5–15)
BUN: 11 mg/dL (ref 8–23)
CO2: 30 mmol/L (ref 22–32)
Calcium: 7.2 mg/dL — ABNORMAL LOW (ref 8.9–10.3)
Chloride: 93 mmol/L — ABNORMAL LOW (ref 98–111)
Creatinine, Ser: 0.81 mg/dL (ref 0.44–1.00)
GFR calc Af Amer: >60 mL/min (ref >60)
GFR calc non Af Amer: >60 mL/min (ref >60)
Glucose, Bld: 92 mg/dL (ref 70–99)
Potassium: 3.2 mmol/L — ABNORMAL LOW (ref 3.5–5.1)
Sodium: 133 mmol/L — ABNORMAL LOW (ref 135–145)

## 2019-04-22 LAB — CBC
HCT: 31.1 % — ABNORMAL LOW (ref 36.0–46.0)
Hemoglobin: 10.3 g/dL — ABNORMAL LOW (ref 12.0–15.0)
MCH: 33.1 pg (ref 26.0–34.0)
MCHC: 33.1 g/dL (ref 30.0–36.0)
MCV: 100 fL (ref 80.0–100.0)
Platelets: 174 10*3/uL (ref 150–400)
RBC: 3.11 MIL/uL — ABNORMAL LOW (ref 3.87–5.11)
RDW: 14.7 % (ref 11.5–15.5)
WBC: 9.5 10*3/uL (ref 4.0–10.5)
nRBC: 0 % (ref 0.0–0.2)

## 2019-04-22 MED ORDER — ATENOLOL 25 MG PO TABS
12.5000 mg | ORAL_TABLET | Freq: Every day | ORAL | Status: DC
  Administered 2019-04-22 – 2019-04-24 (×3): 12.5 mg via ORAL
  Filled 2019-04-22 (×3): qty 0.5

## 2019-04-22 MED ORDER — PANTOPRAZOLE SODIUM 40 MG PO TBEC
40.0000 mg | DELAYED_RELEASE_TABLET | Freq: Every day | ORAL | Status: DC
  Administered 2019-04-23 – 2019-04-24 (×2): 40 mg via ORAL
  Filled 2019-04-22 (×2): qty 1

## 2019-04-22 MED ORDER — LEVOTHYROXINE SODIUM 50 MCG PO TABS
125.0000 ug | ORAL_TABLET | Freq: Every day | ORAL | Status: DC
  Administered 2019-04-23 – 2019-04-24 (×2): 125 ug via ORAL
  Filled 2019-04-22 (×2): qty 1

## 2019-04-22 NOTE — Care Management Important Message (Signed)
Important Message  Patient Details  Name: Mary Barker MRN: [id number] Date of Birth: 1930-05-02   Medicare Important Message Given:  Yes     Dannette Barbara 04/22/2019, 12:12 PM

## 2019-04-22 NOTE — Evaluation (Signed)
Physical Therapy Evaluation Patient Details Name: Mary Barker MRN: [id number] DOB: 02-Jun-1930 Today's Date: 04/22/2019   History of Present Illness  85 year old female admitted for R THA secondary to pain and AVN. PMH to includeL HTN, thyroid dz, OA, Carpal tunnel syndrome.  Clinical Impression  Pt is an 85 year old female who lives in a one story home alone.  She presented as pleasantly confused but was able to follow directions consistently.  She presented with fair strength of UE's and LE's with R LE being more limited than L due to recent THA.  Pt reported 4/10 pain and stated that her primary concern is "getting out of bed".  PT reviewed log roll technique which pt was able to perform but will need follow up education for due to difficulty with carryover.  Pt able to stand from bedside with VC's for proper use of RW and body mechanics for energy conservation and pain management.  Pt able to ambulate in room (just completed 300+ ft with nursing) with need for RW and posture indicating fall risk and balance concerns.  Pt will continue to benefit from skilled PT with focus on strength, balance and safe functional mobility.    Follow Up Recommendations Home health PT;Supervision - Intermittent    Equipment Recommendations  None recommended by PT(youth height)    Recommendations for Other Services       Precautions / Restrictions Precautions Precautions: Fall Precaution Booklet Issued: No      Mobility  Bed Mobility Overal bed mobility: Needs Assistance Bed Mobility: Sidelying to Sit;Sit to Sidelying   Sidelying to sit: Min guard     Sit to sidelying: Min guard General bed mobility comments: Education for log roll technique 3x with improvement each time.  Pt required max VC's and demonstrated little carryover.  Transfers Overall transfer level: Needs assistance Equipment used: Rolling walker (2 wheeled) Transfers: Sit to/from Stand Sit to Stand: Min guard          General transfer comment: Able to rise from bedside on first attempt. VC's for hand placement and proper use of RW.  Ambulation/Gait Ambulation/Gait assistance: Min guard Gait Distance (Feet): 20 Feet Assistive device: Rolling walker (2 wheeled)     Gait velocity interpretation: 1.31 - 2.62 ft/sec, indicative of limited community ambulator General Gait Details: Fair foot clearance, decreased step length, flexed posture.  Pt had just completed ambulating 300+ ft with nursing when PT arrived.  Stairs            Wheelchair Mobility    Modified Rankin (Stroke Patients Only)       Balance Overall balance assessment: Needs assistance Sitting-balance support: Feet supported Sitting balance-Leahy Scale: Good     Standing balance support: Bilateral upper extremity supported Standing balance-Leahy Scale: Fair Standing balance comment: Pt unable to perform formal balance testing due to abdominal pain.  Maintained a flexed posture which affects balance.                             Pertinent Vitals/Pain Pain Assessment: 0-10 Pain Score: 4  Pain Location: mobility    Home Living Family/patient expects to be discharged to:: Private residence(Independent Living) Living Arrangements: Alone Available Help at Discharge: Family;Available 24 hours/day(Son) Type of Home: Independent living facility Home Access: Level entry     Home Layout: One level Home Equipment: Walker - 4 wheels;Shower seat - built in;Toilet riser;Walker - 2 wheels  Prior Function Level of Independence: Independent with assistive device(s)         Comments: patient was using rollator PTA, is active, drives.     Hand Dominance   Dominant Hand: Right    Extremity/Trunk Assessment   Upper Extremity Assessment Upper Extremity Assessment: Generalized weakness    Lower Extremity Assessment Lower Extremity Assessment: Overall WFL for tasks assessed(L LE grossly 4/5) RLE Deficits /  Details: s/p R recent THA, R LE: 4-/5 grossly    Cervical / Trunk Assessment Cervical / Trunk Assessment: Normal  Communication   Communication: No difficulties  Cognition Arousal/Alertness: Awake/alert Behavior During Therapy: WFL for tasks assessed/performed Overall Cognitive Status: Within Functional Limits for tasks assessed                                        General Comments      Exercises Other Exercises Other Exercises: Education regarding seated ther ex: ankle pumps x20, heel slides x10, hip abduction x10, quad sets, hip adduction.   Assessment/Plan    PT Assessment Patient needs continued PT services  PT Problem List Decreased strength;Decreased mobility;Decreased activity tolerance;Decreased balance;Decreased knowledge of use of DME;Pain       PT Treatment Interventions DME instruction;Gait training;Therapeutic exercise;Therapeutic activities;Patient/family education;Stair training;Balance training;Functional mobility training    PT Goals (Current goals can be found in the Care Plan section)  Acute Rehab PT Goals Patient Stated Goal: to return home with Home health PT Goal Formulation: With patient Time For Goal Achievement: 04/13/19 Potential to Achieve Goals: Good    Frequency Min 2X/week   Barriers to discharge        Co-evaluation               AM-PAC PT "6 Clicks" Mobility  Outcome Measure Help needed turning from your back to your side while in a flat bed without using bedrails?: A Little Help needed moving from lying on your back to sitting on the side of a flat bed without using bedrails?: A Little Help needed moving to and from a bed to a chair (including a wheelchair)?: A Little Help needed standing up from a chair using your arms (e.g., wheelchair or bedside chair)?: A Little Help needed to walk in hospital room?: A Little Help needed climbing 3-5 steps with a railing? : A Little 6 Click Score: 18    End of Session  Equipment Utilized During Treatment: Gait belt Activity Tolerance: Patient tolerated treatment well Patient left: in bed;with bed alarm set;with call bell/phone within reach Nurse Communication: Mobility status PT Visit Diagnosis: Muscle weakness (generalized) (M62.81);Difficulty in walking, not elsewhere classified (R26.2);Unsteadiness on feet (R26.81);Other abnormalities of gait and mobility (R26.89);History of falling (Z91.81)    Time: 7741-2878 PT Time Calculation (min) (ACUTE ONLY): 15 min   Charges:   PT Evaluation $PT Eval Low Complexity: 1 Low PT Treatments $Therapeutic Activity: 8-22 mins        Roxanne Gates, PT, DPT   Roxanne Gates 04/22/2019, 2:32 PM

## 2019-04-22 NOTE — Progress Notes (Signed)
[facility] Hospital Day(s): 4.   Post op day(s): 2 Days Post-Op.   Interval History: Patient seen and examined, no acute events or new complaints overnight. Patient reports she still has abdominal soreness around her incision. She denied any fever, chills, nausea, or emesis. She has tolerated a clear liquid diet without issue. Reports flatus. She has not been mobilizing as well secondary to pain. Hypokalemic to 3.2 this AM. No other acute issue.    Vital signs in last 24 hours: [min-max] current  Temp:  [97.6 F (36.4 C)-98.9 F (37.2 C)] 98.5 F (36.9 C) (08/17 0630) Pulse Rate:  [76-87] 77 (08/17 0630) Resp:  [18] 18 (08/17 0630) BP: (114-147)/(47-87) 114/61 (08/17 0630) SpO2:  [92 %-97 %] 95 % (08/17 0630)     Height: 4\' 11"  (149.9 cm) Weight: 54 kg BMI (Calculated): 24.02   Intake/Output last 2 shifts:  08/16 0701 - 08/17 0700 In: 1198.9 [I.V.:802.2; IV Piggyback:396.7] Out: 3950 [Urine:3950]   Physical Exam:  Constitutional: alert, cooperative and no distress  Respiratory: breathing non-labored at rest  Gastrointestinal: soft, expected incisional soreness, and non-distended. No rebound.guarding Integumentary: Laparotomy incision is CDI, no erythema, no drainage, dressing replaced  Labs:  CBC Latest Ref Rng & Units 04/22/2019 04/21/2019 04/20/2019  WBC 4.0 - 10.5 K/uL 9.5 17.7(H) 16.1(H)  Hemoglobin 12.0 - 15.0 g/dL 10.3(L) 11.2(L) 12.6  Hematocrit 36.0 - 46.0 % 31.1(L) 33.9(L) 38.4  Platelets 150 - 400 K/uL 174 189 232   CMP Latest Ref Rng & Units 04/22/2019 04/21/2019 04/20/2019  Glucose 70 - 99 mg/dL 92 130(H) 125(H)  BUN 8 - 23 mg/dL 11 13 15   Creatinine 0.44 - 1.00 mg/dL 0.81 0.85 0.84  Sodium 135 - 145 mmol/L 133(L) 131(L) 135  Potassium 3.5 - 5.1 mmol/L 3.2(L) 4.2 3.9  Chloride 98 - 111 mmol/L 93(L) 104 104  CO2 22 - 32 mmol/L 30 23 25   Calcium 8.9 - 10.3 mg/dL 7.2(L) 7.6(L) 8.5(L)  Total Protein 6.5 - 8.1 g/dL - - 5.1(L)   Total Bilirubin 0.3 - 1.2 mg/dL - - 0.7  Alkaline Phos 38 - 126 U/L - - 47  AST 15 - 41 U/L - - 22  ALT 0 - 44 U/L - - 15     Imaging studies: No new pertinent imaging studies   Assessment/Plan: 85 y.o. female overall doing well with evidence of bowel function return 2 Days Post-Op s/p exploratory laparotomy and small bowel resection for small bowel resection with internal hernia, complicated by pertinent comorbidities including advacned age and multiple comorbidities.   - Advance to full liquid diet  - Continue IVF with K+  - pain control prn  - Monitor abdominal examination + on-going bowel function    - Monitor K+  - medical management of comorbidities  - mobilization encouraged; will engage PT   All of the above findings and recommendations were discussed with the patient, patient's family (will update son once at bedside), and the medical team, and all of patient'squestions were answered to her expressed satisfaction.  -- Edison Simon, PA-C [facility] 04/22/2019, 8:29 AM [phone] M-F: 7am - 4pm

## 2019-04-23 LAB — BASIC METABOLIC PANEL
Anion gap: 4 — ABNORMAL LOW (ref 5–15)
BUN: 10 mg/dL (ref 8–23)
CO2: 25 mmol/L (ref 22–32)
Calcium: 7.6 mg/dL — ABNORMAL LOW (ref 8.9–10.3)
Chloride: 104 mmol/L (ref 98–111)
Creatinine, Ser: 0.65 mg/dL (ref 0.44–1.00)
GFR calc Af Amer: >60 mL/min (ref >60)
GFR calc non Af Amer: >60 mL/min (ref >60)
Glucose, Bld: 92 mg/dL (ref 70–99)
Potassium: 3.3 mmol/L — ABNORMAL LOW (ref 3.5–5.1)
Sodium: 133 mmol/L — ABNORMAL LOW (ref 135–145)

## 2019-04-23 LAB — SURGICAL PATHOLOGY

## 2019-04-23 MED ORDER — POTASSIUM CHLORIDE CRYS ER 20 MEQ PO TBCR
20.0000 meq | EXTENDED_RELEASE_TABLET | Freq: Two times a day (BID) | ORAL | Status: DC
  Administered 2019-04-23 – 2019-04-24 (×3): 20 meq via ORAL
  Filled 2019-04-23 (×3): qty 1

## 2019-04-23 MED ORDER — ENSURE ENLIVE PO LIQD
237.0000 mL | Freq: Two times a day (BID) | ORAL | Status: DC
  Administered 2019-04-23 – 2019-04-24 (×3): 237 mL via ORAL

## 2019-04-23 NOTE — Progress Notes (Signed)
Patient is progressing well. She has not had a bowel movement at this time. She walks down the hall frequently and tolerates it well.

## 2019-04-23 NOTE — Progress Notes (Signed)
[place] Hospital Day(s): 5.   Post op day(s): 3 Days Post-Op.   Interval History: Patient seen and examined, no acute events or new complaints overnight. Patient reports that she is feeling much better compared to presentation. She still has expected abdominal soreness at her incision. No fever, chills, nausea, or emesis. She has tolerated full liquid diet without issues. Still passing flatus but no BM. Mobilizing with PT and RN staff, and recommending HHPT. No other acute issues.    Vital signs in last 24 hours: [min-max] current  Temp:  [97.5 F (36.4 C)-99.4 F (37.4 C)] 98.3 F (36.8 C) (08/18 0321) Pulse Rate:  [69-81] 69 (08/18 0321) Resp:  [18] 18 (08/18 0321) BP: (117-135)/(61-66) 135/61 (08/18 0321) SpO2:  [94 %-96 %] 94 % (08/18 0321)     Height: 4\' 11"  (149.9 cm) Weight: 54 kg BMI (Calculated): 24.02   Intake/Output last 2 shifts:  08/17 0701 - 08/18 0700 In: 600 [P.O.:600] Out: 700 [Urine:700]   Physical Exam:  Constitutional: alert, cooperative and no distress  Respiratory: breathing non-labored at rest  Cardiovascular: regular rate and sinus rhythm  Gastrointestinal: soft, expected incisional soreness, and non-distended. o rebound/guarding.  Integumentary: Laparotomy incision is CDI, no erythema or drainage  Labs:  CBC Latest Ref Rng & Units 04/22/2019 04/21/2019 04/20/2019  WBC 4.0 - 10.5 K/uL 9.5 17.7(H) 16.1(H)  Hemoglobin 12.0 - 15.0 g/dL 10.3(L) 11.2(L) 12.6  Hematocrit 36.0 - 46.0 % 31.1(L) 33.9(L) 38.4  Platelets 150 - 400 K/uL 174 189 232   CMP Latest Ref Rng & Units 04/22/2019 04/21/2019 04/20/2019  Glucose 70 - 99 mg/dL 92 130(H) 125(H)  BUN 8 - 23 mg/dL 11 13 15   Creatinine 0.44 - 1.00 mg/dL 0.81 0.85 0.84  Sodium 135 - 145 mmol/L 133(L) 131(L) 135  Potassium 3.5 - 5.1 mmol/L 3.2(L) 4.2 3.9  Chloride 98 - 111 mmol/L 93(L) 104 104  CO2 22 - 32 mmol/L 30 23 25   Calcium 8.9 - 10.3 mg/dL 7.2(L) 7.6(L) 8.5(L)   Total Protein 6.5 - 8.1 g/dL - - 5.1(L)  Total Bilirubin 0.3 - 1.2 mg/dL - - 0.7  Alkaline Phos 38 - 126 U/L - - 47  AST 15 - 41 U/L - - 22  ALT 0 - 44 U/L - - 15    Imaging studies: No new pertinent imaging studies   Assessment/Plan:  85 y.o. female overall doing well with continued bowel function 3 Days Post-Op s/p exploratory laparotomy and small bowel resection for small bowel resection with internal hernia, complicated by pertinent comorbidities including advacned age and multiple comorbidities.   - Advance to soft diet             - Hep lock IV             - pain control prn             - Monitor abdominal examination + on-going bowel function               - PO K+ repletion; monitor; morning BMP             - medical management of comorbidities             - mobilization encouraged; PT following; recommending home health; will order   - Discharge Planning: Hopefully home in next 24-48 hours  All of the above findings and recommendations were discussed with the patient, patient's family, and the medical team, and all of  patient's and family's questions were answered to their expressed satisfaction.  -- Edison Simon, PA-C [facility] 04/23/2019, 7:26 AM [phone] M-F: 7am - 4pm

## 2019-04-24 LAB — BASIC METABOLIC PANEL
Anion gap: 5 (ref 5–15)
BUN: 13 mg/dL (ref 8–23)
CO2: 26 mmol/L (ref 22–32)
Calcium: 8.4 mg/dL — ABNORMAL LOW (ref 8.9–10.3)
Chloride: 104 mmol/L (ref 98–111)
Creatinine, Ser: 0.69 mg/dL (ref 0.44–1.00)
GFR calc Af Amer: >60 mL/min (ref >60)
GFR calc non Af Amer: >60 mL/min (ref >60)
Glucose, Bld: 89 mg/dL (ref 70–99)
Potassium: 3.5 mmol/L (ref 3.5–5.1)
Sodium: 135 mmol/L (ref 135–145)

## 2019-04-24 MED ORDER — TRAMADOL HCL 50 MG PO TABS: 50.0000 mg | ORAL_TABLET | Freq: Four times a day (QID) | ORAL | 0 refills | Status: DC | PRN

## 2019-04-24 NOTE — TOC Initial Note (Signed)
Transition of Care [facility]) - Initial/Assessment Note    Patient Details  Name: Mary Barker MRN: [id number] Date of Birth: May 05, 1930  Transition [facility]) CM/SW Contact:    Beverly Sessions, RN Phone Number: 04/24/2019, 5:29 PM  Clinical Narrative:                 Patient to discharge home today Patient lives alone at [place] that her sister will be picking her up today.  He sister and neighbors provide transportation  PCP Kary Kos  PT has assessed patient and recommends home health PT.  Patient has Environmental consultant - 4 wheels;Shower seat - built in;Toilet riser;Walker - 2 wheels in the home.  Patient states that she was just recently closed with [facility] and would like to use them again.  Referral made and accepted with Helene Kelp at [facility]  Expected Discharge Plan: [place] Services Barriers to Discharge: Barriers Resolved   Patient Goals and CMS Choice   CMS Medicare.gov Compare Post Acute Care list provided to:: Patient Choice offered to / list presented to : Patient  Expected Discharge Plan and Services Expected Discharge Plan: [place]   Discharge Planning Services: CM Consult Post Acute Care Choice: [place] arrangements for the past 2 months: Apartment Expected Discharge Date: 04/24/19                         HH Arranged: PT HH Agency: [facility] [employer] Date [place]: 04/24/19 Time [place]: 36 Representative spoke with at [place]: [place] Arrangements/Services Living arrangements for the past 2 months: [place] with:: Self Patient language and need for interpreter reviewed:: Yes Do you feel safe going back to the place where you live?: Yes      Need for Family Participation in Patient Care: No (Comment) Care giver support system in place?: Yes (comment)   Criminal Activity/Legal Involvement Pertinent  to Current Situation/Hospitalization: No - Comment as needed  Activities of Daily Living Home Assistive Devices/Equipment: Cane (specify quad or straight), Walker (specify type) ADL Screening (condition at time of admission) Patient's cognitive ability adequate to safely complete daily activities?: Yes Is the patient deaf or have difficulty hearing?: Yes Does the patient have difficulty seeing, even when wearing glasses/contacts?: No Does the patient have difficulty concentrating, remembering, or making decisions?: No Patient able to express need for assistance with ADLs?: Yes Does the patient have difficulty dressing or bathing?: No Independently performs ADLs?: Yes (appropriate for developmental age) Does the patient have difficulty walking or climbing stairs?: Yes Weakness of Legs: Right Weakness of Arms/Hands: None  Permission Sought/Granted                  Emotional Assessment Appearance:: Appears stated age     Orientation: : Oriented to Place, Oriented to Self, Oriented to  Time, Oriented to Situation Alcohol / Substance Use: Not Applicable Psych Involvement: No (comment)  Admission diagnosis:  SBO (small bowel obstruction) ([place]) [K56.609] Patient Active Problem List   Diagnosis Date Noted  . SBO (small bowel obstruction) ([place]) 04/18/2019  . Status post total hip replacement, right 03/26/2019  . Temporal arteritis ([place]) 09/03/2018  . Leukocytosis 08/12/2018  . Pneumonia 12/04/2016   PCP:  Maryland Pink, MD Pharmacy:   [place], [place] [address] [address]  Phone: [phone] Fax: [phone]  [place], [place] [address] [place] [address] Phone: [phone] Fax: [phone]     Social Determinants of Health (SDOH) Interventions    Readmission Risk Interventions No flowsheet data found.

## 2019-04-24 NOTE — Discharge Summary (Signed)
Patient ID: BEKKA QIAN MRN: [id number] DOB/AGE: 1930/04/28 85 y.o.  Admit date: 04/18/2019 Discharge date: 04/24/2019   Discharge Diagnoses:  Active Problems:   SBO (small bowel obstruction) ([place])   Procedures:Laparotomy bowel resection  Hospital Course:  67 yoadmitted with findings consistent with acute SBO , was admitted to the hospital for NGT, NPO. The patient failed medical rx, gastrografin challenge performed showing findings c/w obstruction. She was taken promptly to the operating room for an uneventful lapartomy w SB resection.  Patient did well post-operatively. Her NGT was removed and her diet was advanced. At  The time of discharge the patient was ambulating,  pain was controlled.  Her vital signs were stable and she was afebrile.   physical exam at discharge showed a pt  in no acute distress.  Awake and alert.  Abdomen: Soft incision healing well without infection or peritonitis. Some Ecchymosis around wound.  Extremities well-perfused and no edema.  Condition of the patient the time of discharge was stable. We will arrange PT as outpt given the recent hx of hip fracture.     Disposition: Discharge disposition: 01-Home or Self Care       Discharge Instructions    Call MD for:  difficulty breathing, headache or visual disturbances   Complete by: As directed    Call MD for:  extreme fatigue   Complete by: As directed    Call MD for:  hives   Complete by: As directed    Call MD for:  persistant dizziness or light-headedness   Complete by: As directed    Call MD for:  persistant nausea and vomiting   Complete by: As directed    Call MD for:  redness, tenderness, or signs of infection (pain, swelling, redness, odor or green/yellow discharge around incision site)   Complete by: As directed    Call MD for:  severe uncontrolled pain   Complete by: As directed    Call MD for:  temperature >100.4   Complete by: As directed    Diet - low sodium heart healthy    Complete by: As directed    Discharge instructions   Complete by: As directed    May place ice packs to abd incision   Increase activity slowly   Complete by: As directed    Lifting restrictions   Complete by: As directed    20 lbs x 6 wks     Allergies as of 04/24/2019      Reactions   Codeine Nausea And Vomiting      Medication List    TAKE these medications   acetaminophen 325 MG tablet Commonly known as: TYLENOL Take 1-2 tablets (325-650 mg total) by mouth every 6 (six) hours as needed for mild pain (pain score 1-3 or temp > 100.5).   Actemra 162 MG/0.9ML Sosy Generic drug: Tocilizumab Inject 162 mg into the skin every Tuesday.   alendronate 70 MG tablet Commonly known as: FOSAMAX Take 70 mg by mouth every Friday. Take with a full glass of water on an empty stomach.   aspirin 81 MG tablet Take 81 mg by mouth every evening.   atenolol 25 MG tablet Commonly known as: TENORMIN Take 12.5 mg by mouth daily.   azelastine 0.1 % nasal spray Commonly known as: ASTELIN Place 1 spray into both nostrils daily as needed for rhinitis or allergies. Use in each nostril as directed   Biotin Plus Keratin 10000-100 MCG-MG Tabs Take 1 tablet by mouth daily.  CALCIUM 600+D PO Take 1 tablet by mouth 2 (two) times a day.   CENTRUM SILVER PO Take 1 tablet by mouth every evening.   PRESERVISION AREDS 2 PO Take 1 capsule by mouth 2 (two) times daily.   enoxaparin 40 MG/0.4ML injection Commonly known as: Lovenox Inject 0.4 mLs (40 mg total) into the skin daily for 14 days.   fluticasone 50 MCG/ACT nasal spray Commonly known as: FLONASE Place 2 sprays into both nostrils daily.   hydrocortisone 25 MG suppository Commonly known as: ANUSOL-HC Place 25 mg rectally 2 (two) times daily as needed for hemorrhoids or anal itching.   levothyroxine 125 MCG tablet Commonly known as: SYNTHROID Take 125 mcg by mouth daily before breakfast.   losartan 25 MG tablet Commonly known as:  COZAAR Take 25 mg by mouth daily.   montelukast 10 MG tablet Commonly known as: SINGULAIR Take 10 mg by mouth at bedtime.   ondansetron 4 MG tablet Commonly known as: ZOFRAN Take 4 mg by mouth every 8 (eight) hours as needed for nausea or vomiting.   predniSONE 5 MG tablet Commonly known as: DELTASONE Take 7.5 mg by mouth daily.   spironolactone 25 MG tablet Commonly known as: ALDACTONE Take 25 mg by mouth daily.   SYSTANE ULTRA OP Place 1 drop into both eyes 2 (two) times daily as needed (dry eyes).   traMADol 50 MG tablet Commonly known as: ULTRAM Take 1 tablet (50 mg total) by mouth every 6 (six) hours. What changed: Another medication with the same name was added. Make sure you understand how and when to take each.   traMADol 50 MG tablet Commonly known as: Ultram Take 1 tablet (50 mg total) by mouth every 6 (six) hours as needed. What changed: You were already taking a medication with the same name, and this prescription was added. Make sure you understand how and when to take each.         Caroleen Hamman, MD FACS

## 2019-04-24 NOTE — Progress Notes (Signed)
Pt discharged per MD order. IV removed. Discharge instructions reviewed with pt. All questions answered to pt satisfaction. Pt taken to car in wheelchair by staff.  

## 2019-04-26 ENCOUNTER — Ambulatory Visit (INDEPENDENT_AMBULATORY_CARE_PROVIDER_SITE_OTHER): Payer: PPO | Admitting: Physician Assistant

## 2019-04-26 ENCOUNTER — Other Ambulatory Visit: Payer: Self-pay

## 2019-04-26 ENCOUNTER — Encounter: Payer: Self-pay | Admitting: Physician Assistant

## 2019-04-26 VITALS — BP 118/68 | HR 72 | Temp 97.7°F | Ht 59.0 in | Wt 118.0 lb

## 2019-04-26 DIAGNOSIS — K56609 Unspecified intestinal obstruction, unspecified as to partial versus complete obstruction: Secondary | ICD-10-CM

## 2019-04-26 DIAGNOSIS — Z09 Encounter for follow-up examination after completed treatment for conditions other than malignant neoplasm: Secondary | ICD-10-CM

## 2019-04-26 NOTE — Progress Notes (Signed)
[facility] POST-OP OFFICE VISIT  04/26/2019  HPI: Mary Barker is a 85 y.o. female 6 days s/p exploratory laparotomy and small bowel resection with Dr Dahlia Byes for internal hernia. Overall she is doing fantastic. She denied any abdominal pain, nausea, emesis, or bowel changes. She notes a decreased appetite but is supplementing with Ensure. Pain managed without narcotic pain medications. Mobilizing well. She was just concerned with the bruising around her incision and the possibility of a few staples coming off.   Vital signs: BP 118/68   Pulse 72   Temp 97.7 F (36.5 C) (Skin)   Ht 4\' 11"  (1.499 m)   Wt 118 lb (53.5 kg)   SpO2 97%   BMI 23.83 kg/m    Physical Exam: Constitutional: Well appearing female Abdomen: Soft, non-tender, non-distended Skin: Laparotomy incision is intact with staples, 2 staples removed to the medial portion of the incision and replaced with steri-strips, no erythema or drainage  Assessment/Plan: This is a 85 y.o. female overall doing very well 6 days s/p exploratory laparotomy and small bowel resection     - Continue current pain control regimen  - Okay to shower  - continue to supplement nutrition with ensure, Gatorade, Pedialyte   - 2 staples removed, steri-strips replaced  - Continue to mobilize; she should have home health PT  - I will see her in 10 days to remove staples  -- Edison Simon, PA-C [facility] 04/26/2019, 1:19 PM [phone] M-F: 7am - 4pm

## 2019-04-26 NOTE — Patient Instructions (Signed)
Call us to schedule your follow up for 8/31, 9/1, or 9/2

## 2019-04-27 DIAGNOSIS — E039 Hypothyroidism, unspecified: Secondary | ICD-10-CM | POA: Diagnosis not present

## 2019-04-27 DIAGNOSIS — J449 Chronic obstructive pulmonary disease, unspecified: Secondary | ICD-10-CM | POA: Diagnosis not present

## 2019-04-27 DIAGNOSIS — M316 Other giant cell arteritis: Secondary | ICD-10-CM | POA: Diagnosis not present

## 2019-04-27 DIAGNOSIS — I10 Essential (primary) hypertension: Secondary | ICD-10-CM | POA: Diagnosis not present

## 2019-04-27 DIAGNOSIS — Z471 Aftercare following joint replacement surgery: Secondary | ICD-10-CM | POA: Diagnosis not present

## 2019-04-27 DIAGNOSIS — M810 Age-related osteoporosis without current pathological fracture: Secondary | ICD-10-CM | POA: Diagnosis not present

## 2019-04-27 DIAGNOSIS — Z7901 Long term (current) use of anticoagulants: Secondary | ICD-10-CM | POA: Diagnosis not present

## 2019-04-27 DIAGNOSIS — Z96641 Presence of right artificial hip joint: Secondary | ICD-10-CM | POA: Diagnosis not present

## 2019-04-29 ENCOUNTER — Telehealth: Payer: Self-pay | Admitting: Surgery

## 2019-04-29 NOTE — Telephone Encounter (Signed)
Telephone Triage Questions   Date?  04/20/2019      Physician?    Dr. Dahlia Byes   Nausea only symptom she is having and is asking if something could be called into the pharmacy.

## 2019-04-30 DIAGNOSIS — Z1283 Encounter for screening for malignant neoplasm of skin: Secondary | ICD-10-CM | POA: Diagnosis not present

## 2019-04-30 DIAGNOSIS — L578 Other skin changes due to chronic exposure to nonionizing radiation: Secondary | ICD-10-CM | POA: Diagnosis not present

## 2019-04-30 DIAGNOSIS — Z85828 Personal history of other malignant neoplasm of skin: Secondary | ICD-10-CM | POA: Diagnosis not present

## 2019-04-30 DIAGNOSIS — L814 Other melanin hyperpigmentation: Secondary | ICD-10-CM | POA: Diagnosis not present

## 2019-04-30 DIAGNOSIS — L570 Actinic keratosis: Secondary | ICD-10-CM | POA: Diagnosis not present

## 2019-04-30 DIAGNOSIS — L820 Inflamed seborrheic keratosis: Secondary | ICD-10-CM | POA: Diagnosis not present

## 2019-04-30 DIAGNOSIS — L821 Other seborrheic keratosis: Secondary | ICD-10-CM | POA: Diagnosis not present

## 2019-04-30 NOTE — Telephone Encounter (Signed)
Telephone Triage Questions    Type of surgery?      EXPLORATORY LAPAROTOMY Small Bowel Obstruction                 Date?           04/20/2019                   Physician?     Dr. Drucie Ip pains having them all morning   Last Bowel movement?  This morning

## 2019-04-30 NOTE — Telephone Encounter (Signed)
I talked with the patient and she denies nausea and vomiting today. No fever or chills. Incisions healing well. Bowels moving daily just a little "constipated" at first. She states she will try Colace to see if that helps. She states the "gas" today has been the issue. In reviewing what she has eaten she states yesterday she ate bean soup, that maybe she needs to avoid that, encouraged to avoid "gas" causing foods for now, pt agrees. The patient is aware to call back for any questions or new concerns.

## 2019-05-01 ENCOUNTER — Encounter: Payer: Self-pay | Admitting: Physician Assistant

## 2019-05-02 ENCOUNTER — Telehealth: Payer: Self-pay | Admitting: *Deleted

## 2019-05-02 NOTE — Telephone Encounter (Signed)
Telephone Triage Questions    Type of surgery? Exploratory laparotomy small bowel obstruction                     Date? 04-20-19                        Physician? Dr. Dahlia Byes  Pain ? Where and how severe, (1-10) Gas Pain between 7-8 (only has gas pain in the morning that lasts about 2-2 1/2 hours)  Nausea, vomiting, Fever, chills? No  Last Bowel movement? today  Current Antibiotics or Narcotics? No  Have you tried Ibuprofen/Tylenol combination for your pain? Takes tylenol pm  Patient called the office today and states she has gas pains in the morning lasting between 2-2 1/2 hours. She wants to know if there is something she can take to relieve the pain. Patient states she is leaving the house about 3 pm today so if you aren't able to reach her then it is okay to leave a detailed message on her home number.    Follow up appointment scheduled for 05-07-19 at 2 pm with Thedore Mins, [place].  Note routed to [facility] Clinical Pool.

## 2019-05-02 NOTE — Telephone Encounter (Signed)
Left message for patient to return call.

## 2019-05-03 NOTE — Telephone Encounter (Signed)
Left message for patient to return call to office. 

## 2019-05-03 NOTE — Telephone Encounter (Signed)
Patient notified that she may try over the counter Tums or may try Gas-ex or Simethicone for gas. She needs to stay away from gas producing foods like brown beans, collards, broccoli, cabbage, and brussel sprouts. She is amendable to this.

## 2019-05-03 NOTE — Telephone Encounter (Signed)
Patient is calling said she had a missed call yesterday. Please call patient and advise.

## 2019-05-06 DIAGNOSIS — H353211 Exudative age-related macular degeneration, right eye, with active choroidal neovascularization: Secondary | ICD-10-CM | POA: Diagnosis not present

## 2019-05-07 ENCOUNTER — Encounter: Payer: PPO | Admitting: Physician Assistant

## 2019-05-07 ENCOUNTER — Ambulatory Visit (INDEPENDENT_AMBULATORY_CARE_PROVIDER_SITE_OTHER): Payer: PPO | Admitting: Physician Assistant

## 2019-05-07 ENCOUNTER — Other Ambulatory Visit: Payer: Self-pay

## 2019-05-07 ENCOUNTER — Encounter: Payer: Self-pay | Admitting: Physician Assistant

## 2019-05-07 VITALS — BP 131/83 | HR 84 | Temp 97.2°F | Resp 16 | Ht 59.0 in | Wt 116.0 lb

## 2019-05-07 DIAGNOSIS — K56609 Unspecified intestinal obstruction, unspecified as to partial versus complete obstruction: Secondary | ICD-10-CM

## 2019-05-07 DIAGNOSIS — Z09 Encounter for follow-up examination after completed treatment for conditions other than malignant neoplasm: Secondary | ICD-10-CM

## 2019-05-07 NOTE — Progress Notes (Signed)
[facility] POST-OP OFFICE VISIT  05/07/2019  HPI: Mary Barker is a 85 y.o. female 17 days s/p exploratory laparotomy and small bowel resection.   Overall, she reports that she has been doing well. She has had issues with gas pain recently. She took one 2 mg imodium over the weekend and since then she has not had any issues with gas pain. No fever, chills, nausea, or emesis. Having daily BMs. She feels that she has recouped some strength but still feels "weak in the knees." She was supposed to be set up with Home health and PT through [facility] but they never came and she was wondering about this. No other acute issues.   Vital signs: BP 131/83   Pulse 84   Temp (!) 97.2 F (36.2 C) (Temporal)   Resp 16   Ht 4\' 11"  (1.499 m)   Wt 116 lb (52.6 kg)   SpO2 94%   BMI 23.43 kg/m    Physical Exam: Constitutional: Well appearing female, NAD Abdomen: Soft, non-tender, non-distended, no rebound/guarding Skin: Laparotomy incision is healing well, staples removed, steri-strips replaced. No erythema or drainage.   Assessment/Plan: This is a 85 y.o. female 17 days s/p exploratory laparotomy and small bowel resection.    - pain control prn  - staples removed, steri-strips placed  - Complete lifting restrictions (6 weeks total)   - encouraged nutritional supplementation   - RTC in 2 weeks for re-check   -- Edison Simon, PA-C [facility] 05/07/2019, 10:22 AM [phone] M-F: 7am - 4pm

## 2019-05-07 NOTE — Patient Instructions (Addendum)
W removed staples today and placed steri strips. These will fall off in 7-10 days. You may shower as usual but do not scrub over the area.  You can try drinking boost to help with nourishment. Please call our office if you have questions or concerns.

## 2019-05-08 DIAGNOSIS — M1611 Unilateral primary osteoarthritis, right hip: Secondary | ICD-10-CM | POA: Diagnosis not present

## 2019-05-08 DIAGNOSIS — M316 Other giant cell arteritis: Secondary | ICD-10-CM | POA: Diagnosis not present

## 2019-05-08 DIAGNOSIS — M87151 Osteonecrosis due to drugs, right femur: Secondary | ICD-10-CM | POA: Diagnosis not present

## 2019-05-17 DIAGNOSIS — L570 Actinic keratosis: Secondary | ICD-10-CM | POA: Diagnosis not present

## 2019-05-17 DIAGNOSIS — L820 Inflamed seborrheic keratosis: Secondary | ICD-10-CM | POA: Diagnosis not present

## 2019-05-21 ENCOUNTER — Encounter: Payer: Self-pay | Admitting: Surgery

## 2019-05-22 ENCOUNTER — Encounter: Payer: Self-pay | Admitting: Surgery

## 2019-05-22 ENCOUNTER — Ambulatory Visit (INDEPENDENT_AMBULATORY_CARE_PROVIDER_SITE_OTHER): Payer: PPO | Admitting: Surgery

## 2019-05-22 ENCOUNTER — Other Ambulatory Visit: Payer: Self-pay

## 2019-05-22 VITALS — BP 157/83 | HR 94 | Temp 97.5°F | Resp 14 | Ht 60.0 in | Wt 116.8 lb

## 2019-05-22 DIAGNOSIS — Z09 Encounter for follow-up examination after completed treatment for conditions other than malignant neoplasm: Secondary | ICD-10-CM

## 2019-05-22 NOTE — Patient Instructions (Signed)
Please call with any questions or concerns.

## 2019-05-24 NOTE — Progress Notes (Signed)
S/p e lap and SB resection for SBO 8/15 Doing very well apparently she did have issues regarding constipation and diarrhea but those have resolved.  She is tolerating regular diet.  No fevers no chills.  PE NAD Abd: soft, nt, scar w/o infection  A/p Doing very well No complications RTC prn

## 2019-05-27 DIAGNOSIS — S80812A Abrasion, left lower leg, initial encounter: Secondary | ICD-10-CM | POA: Diagnosis not present

## 2019-05-29 ENCOUNTER — Other Ambulatory Visit: Payer: Self-pay | Admitting: Family Medicine

## 2019-05-29 DIAGNOSIS — Z1231 Encounter for screening mammogram for malignant neoplasm of breast: Secondary | ICD-10-CM

## 2019-06-12 DIAGNOSIS — R42 Dizziness and giddiness: Secondary | ICD-10-CM | POA: Diagnosis not present

## 2019-06-12 DIAGNOSIS — Z23 Encounter for immunization: Secondary | ICD-10-CM | POA: Diagnosis not present

## 2019-06-12 DIAGNOSIS — E039 Hypothyroidism, unspecified: Secondary | ICD-10-CM | POA: Diagnosis not present

## 2019-06-12 DIAGNOSIS — Z0000 Encounter for general adult medical examination without abnormal findings: Secondary | ICD-10-CM | POA: Diagnosis not present

## 2019-06-12 DIAGNOSIS — M316 Other giant cell arteritis: Secondary | ICD-10-CM | POA: Diagnosis not present

## 2019-06-12 DIAGNOSIS — H6011 Cellulitis of right external ear: Secondary | ICD-10-CM | POA: Diagnosis not present

## 2019-06-12 DIAGNOSIS — I10 Essential (primary) hypertension: Secondary | ICD-10-CM | POA: Diagnosis not present

## 2019-06-26 DIAGNOSIS — G5603 Carpal tunnel syndrome, bilateral upper limbs: Secondary | ICD-10-CM | POA: Diagnosis not present

## 2019-06-27 DIAGNOSIS — R220 Localized swelling, mass and lump, head: Secondary | ICD-10-CM | POA: Diagnosis not present

## 2019-06-27 DIAGNOSIS — M316 Other giant cell arteritis: Secondary | ICD-10-CM | POA: Diagnosis not present

## 2019-07-03 ENCOUNTER — Ambulatory Visit
Admission: RE | Admit: 2019-07-03 | Discharge: 2019-07-03 | Disposition: A | Discharge status: Home / Self Care | Payer: PPO | Source: Ambulatory Visit | Attending: Family Medicine | Admitting: Family Medicine

## 2019-07-03 DIAGNOSIS — Z1231 Encounter for screening mammogram for malignant neoplasm of breast: Secondary | ICD-10-CM

## 2019-07-11 DIAGNOSIS — M316 Other giant cell arteritis: Secondary | ICD-10-CM | POA: Diagnosis not present

## 2019-07-17 DIAGNOSIS — G5603 Carpal tunnel syndrome, bilateral upper limbs: Secondary | ICD-10-CM | POA: Diagnosis not present

## 2019-07-18 DIAGNOSIS — M810 Age-related osteoporosis without current pathological fracture: Secondary | ICD-10-CM | POA: Diagnosis not present

## 2019-07-18 DIAGNOSIS — M316 Other giant cell arteritis: Secondary | ICD-10-CM | POA: Diagnosis not present

## 2019-07-18 DIAGNOSIS — M25511 Pain in right shoulder: Secondary | ICD-10-CM | POA: Diagnosis not present

## 2019-08-13 DIAGNOSIS — H353211 Exudative age-related macular degeneration, right eye, with active choroidal neovascularization: Secondary | ICD-10-CM | POA: Diagnosis not present

## 2019-08-15 DIAGNOSIS — M810 Age-related osteoporosis without current pathological fracture: Secondary | ICD-10-CM | POA: Diagnosis not present

## 2019-08-15 DIAGNOSIS — M25511 Pain in right shoulder: Secondary | ICD-10-CM | POA: Diagnosis not present

## 2019-08-15 DIAGNOSIS — M316 Other giant cell arteritis: Secondary | ICD-10-CM | POA: Diagnosis not present

## 2019-08-20 DIAGNOSIS — R0602 Shortness of breath: Secondary | ICD-10-CM | POA: Diagnosis not present

## 2019-08-20 DIAGNOSIS — I10 Essential (primary) hypertension: Secondary | ICD-10-CM | POA: Diagnosis not present

## 2019-08-21 DIAGNOSIS — H8111 Benign paroxysmal vertigo, right ear: Secondary | ICD-10-CM | POA: Diagnosis not present

## 2019-08-21 DIAGNOSIS — I10 Essential (primary) hypertension: Secondary | ICD-10-CM | POA: Diagnosis not present

## 2019-08-21 DIAGNOSIS — E038 Other specified hypothyroidism: Secondary | ICD-10-CM | POA: Diagnosis not present

## 2019-08-23 ENCOUNTER — Ambulatory Visit: Payer: PPO | Attending: Internal Medicine

## 2019-08-23 DIAGNOSIS — Z20828 Contact with and (suspected) exposure to other viral communicable diseases: Secondary | ICD-10-CM | POA: Diagnosis not present

## 2019-08-23 DIAGNOSIS — Z20822 Contact with and (suspected) exposure to covid-19: Secondary | ICD-10-CM

## 2019-08-24 LAB — NOVEL CORONAVIRUS, NAA: SARS-CoV-2, NAA: NOT DETECTED

## 2019-08-27 ENCOUNTER — Telehealth: Payer: Self-pay | Admitting: General Practice

## 2019-08-27 DIAGNOSIS — E038 Other specified hypothyroidism: Secondary | ICD-10-CM | POA: Diagnosis not present

## 2019-08-27 DIAGNOSIS — R42 Dizziness and giddiness: Secondary | ICD-10-CM | POA: Diagnosis not present

## 2019-08-27 DIAGNOSIS — I10 Essential (primary) hypertension: Secondary | ICD-10-CM | POA: Diagnosis not present

## 2019-08-27 DIAGNOSIS — H8111 Benign paroxysmal vertigo, right ear: Secondary | ICD-10-CM | POA: Diagnosis not present

## 2019-08-27 NOTE — Telephone Encounter (Signed)
Negative COVID results given. Patient results "NOT Detected." Caller expressed understanding. ° °

## 2019-09-05 DIAGNOSIS — Z96641 Presence of right artificial hip joint: Secondary | ICD-10-CM | POA: Diagnosis not present

## 2019-09-16 ENCOUNTER — Ambulatory Visit: Payer: PPO

## 2019-09-17 ENCOUNTER — Ambulatory Visit: Payer: Medicare Other | Attending: Internal Medicine

## 2019-09-17 DIAGNOSIS — Z23 Encounter for immunization: Secondary | ICD-10-CM

## 2019-09-17 NOTE — Progress Notes (Signed)
   Covid-19 Vaccination Clinic  Name:  Mary Barker    MRN: [id number] DOB: [birth year]  09/17/2019  [name] was observed post Covid-19 immunization for 15 minutes without incidence. She was provided with Vaccine Information Sheet and instruction to access the V-Safe system.   [name] was instructed to call 911 with any severe reactions post vaccine: Marland Kitchen Difficulty breathing  . Swelling of your face and throat  . A fast heartbeat  . A bad rash all over your body  . Dizziness and weakness    Immunizations Administered    Name Date Dose VIS Date Route   Pfizer COVID-19 Vaccine 09/17/2019 11:54 AM 0.3 mL 08/16/2019 Intramuscular   Manufacturer: [place]   Lot: [id number]   [name]: [id number]

## 2019-09-18 DIAGNOSIS — R42 Dizziness and giddiness: Secondary | ICD-10-CM | POA: Diagnosis not present

## 2019-09-18 DIAGNOSIS — H903 Sensorineural hearing loss, bilateral: Secondary | ICD-10-CM | POA: Diagnosis not present

## 2019-09-23 DIAGNOSIS — R42 Dizziness and giddiness: Secondary | ICD-10-CM | POA: Diagnosis not present

## 2019-09-27 ENCOUNTER — Other Ambulatory Visit: Payer: Self-pay

## 2019-09-27 ENCOUNTER — Ambulatory Visit: Payer: PPO | Attending: Physician Assistant

## 2019-09-27 DIAGNOSIS — R42 Dizziness and giddiness: Secondary | ICD-10-CM | POA: Insufficient documentation

## 2019-09-27 NOTE — Patient Instructions (Signed)
Access Code: [email or web address]  URL: [email or web address]  Date: 09/27/2019  Prepared by: Roxana Hires   Exercises Seated Gaze Stabilization with Head Rotation - 3 reps - 60 seconds hold - 4x daily - 7x weekly

## 2019-09-27 NOTE — Therapy (Signed)
[place] MAIN [facility] SERVICES [address] [place], [place], [address] Phone: [phone]   Fax:  [phone]  Physical Therapy Evaluation  Patient Details  Name: Mary Barker MRN: [id number] Date of Birth: 05-18-1930 Referring Provider (PT): Brett Fairy   Encounter Date: 09/27/2019  PT End of Session - 09/27/19 1034    Visit Number  1    Number of Visits  9    Date for PT Re-Evaluation  11/22/19    Authorization Type  eval: 09/27/19    PT Start Time  1020    PT Stop Time  1120    PT Time Calculation (min)  60 min    Equipment Utilized During Treatment  Gait belt    Activity Tolerance  Patient tolerated treatment well    Behavior During Therapy  [facility] for tasks assessed/performed       Past Medical History:  Diagnosis Date  . Carpal tunnel syndrome   . COPD (chronic obstructive pulmonary disease) ([place])   . Dyspnea   . Family history of adverse reaction to anesthesia    sister hard time waking up  . GCA (giant cell arteritis) ([place])   . H/O multiple pulmonary nodules   . Hypertension   . Hyponatremia   . Hypothyroidism   . Osteoarthritis   . Seasonal allergies   . Thyroid disease   . Tingling in extremities     Past Surgical History:  Procedure Laterality Date  . APPENDECTOMY    . ARTERY BIOPSY Left 07/31/2018   Procedure: BIOPSY TEMPORAL ARTERY;  Surgeon: Benjamine Sprague, DO;  Location: [facility] ORS;  Service: General;  Laterality: Left;  . BREAST EXCISIONAL BIOPSY Left 1954   neg  . BREAST SURGERY    . COLONOSCOPY    . DILATION AND CURETTAGE OF UTERUS    . EYE SURGERY Bilateral    cataract  . KNEE ARTHROSCOPY    . LAPAROTOMY N/A 04/20/2019   Procedure: EXPLORATORY LAPAROTOMY Small Bowel Obstruction;  Surgeon: Jules Husbands, MD;  Location: [facility] ORS;  Service: General;  Laterality: N/A;  . NASAL POLYP SURGERY    . TONSILLECTOMY    . TOTAL HIP ARTHROPLASTY Right 03/26/2019   Procedure: RIGHT TOTAL HIP ARTHROPLASTY;   Surgeon: Hessie Knows, MD;  Location: [facility] ORS;  Service: Orthopedics;  Laterality: Right;    There were no vitals filed for this visit.   Subjective Assessment - 09/27/19 1031    Subjective  Dizziness    Pertinent History  Pt referred by ENT for dizziness. Medical record states VNG positive for peripheral hypofunction but does not specify laterality. Pt reports she was told there was an issue with her R ear. She reports that her symptoms started approximately 5 years ago when she first had BPPV. She was successfully treated with the Epley maneuver. More recently she first had vertigo approximately 1 month ago when she tipped her head back to put in eye drops. She has not had any vertigo since that time but complains of feeling like "my head just isn't right." She reports that she is feeling "swimmy headed" when she first wakes up in the morning for a couple minutes. Occasionally the symptoms will last approximately 5-10 minutes. Once she gets moving the symptoms don't bother her. She is very active with regular walking for exercise. History of bilateral carpal tunnel and she wears night splints. She lives at [facility] and has seen the physical therapist in the past. It sounds  like she was prescribed gaze stabilization exercises.    Limitations  Walking    Diagnostic tests  VNG demonstrating peripheral hypofunction    Patient Stated Goals  Decrease "swimmy-headed" feeling    Currently in Pain?  No/denies   Unrelated to current episode        [facility] PT Assessment - 09/27/19 1033      Assessment   Medical Diagnosis  Dizziness    Referring Provider (PT)  Brett Fairy    Onset Date/Surgical Date  08/27/19    Hand Dominance  Right    Next MD Visit  None with ENT    Prior Therapy  Previous vestibular therapy for BPPV at [facility]      Precautions   Precautions  Fall      Restrictions   Weight Bearing Restrictions  No      Balance Screen   Has the patient fallen  in the past 6 months  No    Has the patient had a decrease in activity level because of a fear of falling?   No    Is the patient reluctant to leave their home because of a fear of falling?   No      Home Environment   Living Environment  Private residence    Living Arrangements  Alone    Available Help at Discharge  Family    Type of [place] living apartment at [place] [facility]    [place]  One level      Prior Function   Level of [place]  Retired   Science writer, homemaker   Leisure  Walking, reading (fiction, mystery), puzzles      Cognition   Overall Cognitive Status  Within Functional Limits for tasks assessed         VESTIBULAR AND BALANCE EVALUATION   HISTORY:  Subjective history of current problem: Pt referred by ENT for dizziness. Medical record states VNG positive for peripheral hypofunction but does not specify laterality. Pt reports she was told there was an issue with her R ear. She reports that her symptoms started approximately 5 years ago when she first had BPPV. She was successfully treated with the Epley maneuver. More recently she first had vertigo approximately 1 month ago when she tipped her head back to put in eye drops. She has not had any vertigo since that time but complains of feeling like "my head just isn't right." She reports that she is feeling "swimmy headed" when she first wakes up in the morning for a couple minutes. Occasionally the symptoms will last approximately 5-10 minutes. Once she gets moving the symptoms don't bother her. She is very active with regular walking for exercise. History of bilateral carpal tunnel and she wears night splints. She lives at [facility] and has seen the physical therapist in the past. It sounds like she was prescribed gaze stabilization exercises. Description of dizziness: (vertigo, unsteadiness, lightheadedness,  falling, general unsteadiness, whoozy, swimmy-headed sensation, aural fullness): swimmy-headed sensation Frequency: Most mornings Duration: 5-10 minutes Symptom nature: (motion provoked, positional, spontaneous, constant, variable, intermittent) motion provoked when first rolling or sitting up in bed  Provocative Factors: transitioning from laying to sitting up, occasionally worsens with laying on back Easing Factors: Waiting for symptoms to resolve or lay back down  Progression of symptoms: (better, worse, no change since onset) better History of similar episodes:  History of vertigo as well as "swimmy headed sensation" in the past  Falls (yes/no): No Number of falls in past 6 months: None  Prior Functional Level: Independent with ADLs/IADLs  Auditory complaints (tinnitus, pain, drainage, hearing loss, aural fullness): None with the exception of "a couple episodes of a funny sensation in her R ear." Corrective hearing aids bilaterally Vision (diplopia, visual field loss, recent changes, last eye exam): 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 Well controlled macular degeneration in her R eye which she receives injections every 2-3 months. Also uses eye drops for chronic dry eyes.   Red Flags: (dysarthria, dysphagia, drop attacks, bowel and bladder changes, recent weight loss/gain) Review of systems negative for red flags.    EXAMINATION  POSTURE: Mild forward head but grossly WNL  NEUROLOGICAL SCREEN: (2+ unless otherwise noted.) N=normal  Ab=abnormal  Level Dermatome R L Myotome R L Reflex R L  C3 Anterior Neck N N Sidebend C2-3 N N Jaw CN V    C4 Top of Shoulder N N Shoulder Shrug C4 N N Hoffman's UMN    C5 Lateral Upper Arm N N Shoulder ABD C4-5 N N Biceps C5-6    C6 Lateral Arm/ Thumb N N Arm Flex/ Wrist Ext C5-6 N N Brachiorad. C5-6    C7 Middle Finger N N Arm Ext//Wrist Flex C6-7 N N Triceps C7    C8  4th & 5th Finger N N Flex/ Ext Carpi Ulnaris C8 N N Patellar (L3-4)    T1 Medial Arm N N Interossei T1 N N Gastrocnemius    L2 Medial thigh/groin N N Illiopsoas (L2-3) N N     L3 Lower thigh/med.knee N N Quadriceps (L3-4) N N     L4 Medial leg/lat thigh N N Tibialis Ant (L4-5) N N     L5 Lat. leg & dorsal foot N N EHL (L5) N N     S1 post/lat foot/thigh/leg N N Gastrocnemius (S1-2) N N     S2 Post./med. thigh & leg N N Hamstrings (L4-S3) N N      Reflex testing deferred  Cranial Nerves Visual acuity and visual fields are intact  Extraocular muscles are intact  Facial sensation is intact bilaterally  Facial strength is intact bilaterally  Hearing is normal as tested by gross conversation with use of hearing aids Normal phonation  Shoulder shrug strength is intact  Tongue protrudes midline    SOMATOSENSORY:         Sensation           Intact      Diminished         Absent  Light touch Normal       COORDINATION: Finger to Nose: Normal Heel to Shin: Normal Rapid Alternating Movements: Normal Finger to Thumb Opposition: Normal   MUSCULOSKELETAL SCREEN: Cervical Spine ROM: Moderate limitation in extension, mild limitation in bilateral rotation, lateral flexion, and flexion   ROM: Grossly WFL  MMT: Grossly WFL  Functional Mobility: Independent transfers and ambulation without assistive device  Gait: Scanning of visual environment with gait is: normal   OCULOMOTOR / VESTIBULAR TESTING:  Oculomotor Exam- Room Light  Findings Comments  Ocular Alignment normal   Ocular ROM normal   Spontaneous Nystagmus normal   Gaze-Holding Nystagmus abnormal L horizontal beating with mid-range L gaze and R horizontal beating with mid-range R gaze  End-Gaze Nystagmus abnormal See above  Vergence (normal 2-3") abnormal  Approximately 12 inches  Smooth Pursuit abnormal Saccadic however difficult to determine due to baseline vision deficits in both eyes  Cross-Cover Test not examined    Saccades abnormal Multiple corrections required to reach target  VOR Cancellation normal   Left Head Impulse normal   Right Head Impulse abnormal Corrective saccade observed   Static Acuity not examined   Dynamic Acuity not examined     Oculomotor Exam- Fixation Suppressed Deferred Testing during Initial evaluation  BPPV TESTS:  Symptoms Duration Intensity Nystagmus  L Dix-Hallpike None   None  R Dix-Hallpike None   None  L Head Roll None   None  R Head Roll None   None  L Sidelying Test      R Sidelying Test        FUNCTIONAL OUTCOME MEASURES   Results Comments  ABC Scale 83.1% WNL  DHI 14/100 Low perception of handicap            Objective measurements completed on examination: See above findings.      TREATMENT   Neuromuscular Re-education  VOR x 1 horizontal in sitting x 60s, pt denies any dizziness, pt requires verbal and tactile cues to perform correctly; Pt issued written handout with extensive education about how to perform HEP;        PT Education - 09/27/19 1034    Education Details  Plan of care    Person(s) Educated  Patient    Methods  Explanation    Comprehension  Verbalized understanding       PT Short Term Goals - 09/27/19 1226      PT SHORT TERM GOAL #1   Title  Pt will be independent with HEP in order to decrease dizziness and improve function at home.    Time  4    Period  Weeks    Status  New    Target Date  10/25/19        PT Long Term Goals - 09/27/19 1227      PT LONG TERM GOAL #1   Title  Pt will decrease DHI score to 0/100 in order to demonstrate clinically significant reduction in disability related to dizziness    Baseline  09/27/19: 14/100    Time  8    Period  Weeks    Status  New    Target Date  11/22/19      PT LONG TERM GOAL #2   Title  Pt will report no further episodes of feeling "swimmy-headed" when getting out of bed in the morning    Baseline  09/27/19: daily symptoms    Time  8    Period   Weeks    Status  New    Target Date  11/22/19             Plan - 09/27/19 1034    Clinical Impression Statement  Pt is a pleasant 86 year-old female referred for dizziness. Bedside oculomotor and vestibular testing is somewhat limited due to baseline vision deficits, especially in her L eyes. Her smooth pursuits appear saccadic and her horizontal/vertical saccade testing requires multiple corrections to reach target. Positive R head impulse test. All BPPV testing negative today. Per medical record pt with peripheral hypofunction on VNG. ABC is WNL at 83.1% and DHI of 14/100 demonstrates low perception of handicap. Pt presents with deficits in dizziness and will benefit from skilled PT services to address symptoms and decrease risk for falls    Personal Factors and Comorbidities  Age;Comorbidity 3+    Comorbidities  HTN, OA, hypothyroidism, giant cell arteritis    Examination-Activity Limitations  Other   Getting out of bed when first waking   Stability/Clinical Decision Making  Stable/Uncomplicated    Clinical Decision Making  Low    Rehab Potential  Good    PT Frequency  1x / week    PT Duration  8 weeks    PT Treatment/Interventions  ADLs/Self Care Home Management;Aquatic Therapy;Biofeedback;Canalith Repostioning;Cryotherapy;Electrical Stimulation;Iontophoresis 4mg /ml Dexamethasone;Moist Heat;Traction;Ultrasound;DME Instruction;Gait training;Stair training;Functional mobility training;Therapeutic activities;Therapeutic exercise;Balance training;Neuromuscular re-education;Patient/family education;Manual techniques;Passive range of motion;Dry needling;Vestibular;Joint Manipulations    PT Next Visit Plan  Additional outcome measures: BERG, DGI, 46m gait speed, 5TSTS, review VOR x 1 horizontal and progress to standing if appropriate, continue with adaptation and habituation exercises    PT Home Exercise Plan  Medbridge Access Code: [email or web address] (VOR x 1 horizontal in sitting)    Consulted and  Agree with Plan of Care  Patient       Patient will benefit from skilled therapeutic intervention in order to improve the following deficits and impairments:  Dizziness  Visit Diagnosis: Dizziness and giddiness     Problem List Patient Active Problem List   Diagnosis Date Noted  . SBO (small bowel obstruction) ([place]) 04/18/2019  . Status post total hip replacement, right 03/26/2019  . Temporal arteritis ([place]) 09/03/2018  . Leukocytosis 08/12/2018  . Pneumonia 12/04/2016    Phillips Grout PT, DPT, GCS  , 09/27/2019, 12:36 PM  [place] MAIN [facility] SERVICES [address] [place], [place], [address] Phone: [phone]   Fax:  [phone]  Name: Mary Barker MRN: [id number] Date of Birth: 1930/08/20

## 2019-10-03 ENCOUNTER — Other Ambulatory Visit: Payer: Self-pay

## 2019-10-03 ENCOUNTER — Ambulatory Visit: Payer: PPO

## 2019-10-03 DIAGNOSIS — R42 Dizziness and giddiness: Secondary | ICD-10-CM | POA: Diagnosis not present

## 2019-10-03 NOTE — Patient Instructions (Signed)
Access Code: [email or web address] URL: [email or web address] Date: 10/03/2019 Prepared by: Roxana Hires  Exercises Half Tandem Stance Balance with Head Rotation - 3 reps - 3 x 30s with each foot forward hold - 2x daily - 7x weekly Standing Gaze Stabilization with Head Rotation - 3 reps - 60 seconds hold - 4x daily - 7x weekly

## 2019-10-03 NOTE — Therapy (Signed)
[place] MAIN [facility] SERVICES [address] [place], [place], [address] Phone: [phone]   Fax:  [phone]  Physical Therapy Treatment  Patient Details  Name: Mary Barker MRN: [id number] Date of Birth: [birth year] Referring Provider (PT): Brett Fairy   Encounter Date: 10/03/2019  PT End of Session - 10/03/19 1527    Visit Number  2    Number of Visits  9    Date for PT Re-Evaluation  11/22/19    Authorization Type  eval: 09/27/19    PT Start Time  1522    PT Stop Time  1600    PT Time Calculation (min)  38 min    Equipment Utilized During Treatment  Gait belt    Activity Tolerance  Patient tolerated treatment well    Behavior During Therapy  [facility] for tasks assessed/performed       Past Medical History:  Diagnosis Date  . Carpal tunnel syndrome   . COPD (chronic obstructive pulmonary disease) ([place])   . Dyspnea   . Family history of adverse reaction to anesthesia    sister hard time waking up  . GCA (giant cell arteritis) ([place])   . H/O multiple pulmonary nodules   . Hypertension   . Hyponatremia   . Hypothyroidism   . Osteoarthritis   . Seasonal allergies   . Thyroid disease   . Tingling in extremities     Past Surgical History:  Procedure Laterality Date  . APPENDECTOMY    . ARTERY BIOPSY Left 07/31/2018   Procedure: BIOPSY TEMPORAL ARTERY;  Surgeon: Benjamine Sprague, DO;  Location: [facility] ORS;  Service: General;  Laterality: Left;  . BREAST EXCISIONAL BIOPSY Left 1954   neg  . BREAST SURGERY    . COLONOSCOPY    . DILATION AND CURETTAGE OF UTERUS    . EYE SURGERY Bilateral    cataract  . KNEE ARTHROSCOPY    . LAPAROTOMY N/A 04/20/2019   Procedure: EXPLORATORY LAPAROTOMY Small Bowel Obstruction;  Surgeon: Jules Husbands, MD;  Location: [facility] ORS;  Service: General;  Laterality: N/A;  . NASAL POLYP SURGERY    . TONSILLECTOMY    . TOTAL HIP ARTHROPLASTY Right 03/26/2019   Procedure: RIGHT TOTAL HIP ARTHROPLASTY;   Surgeon: Hessie Knows, MD;  Location: [facility] ORS;  Service: Orthopedics;  Laterality: Right;    There were no vitals filed for this visit.  Subjective Assessment - 10/03/19 1527    Subjective  Dizziness    Pertinent History  Pt referred by ENT for dizziness. Medical record states VNG positive for peripheral hypofunction but does not specify laterality. Pt reports she was told there was an issue with her R ear. She reports that her symptoms started approximately 5 years ago when she first had BPPV. She was successfully treated with the Epley maneuver. More recently she first had vertigo approximately 1 month ago when she tipped her head back to put in eye drops. She has not had any vertigo since that time but complains of feeling like "my head just isn't right." She reports that she is feeling "swimmy headed" when she first wakes up in the morning for a couple minutes. Occasionally the symptoms will last approximately 5-10 minutes. Once she gets moving the symptoms don't bother her. She is very active with regular walking for exercise. History of bilateral carpal tunnel and she wears night splints. She lives at [facility] and has seen the physical therapist in the past. It sounds like  she was prescribed gaze stabilization exercises.    Limitations  Walking    Diagnostic tests  VNG demonstrating peripheral hypofunction    Patient Stated Goals  Decrease "swimmy-headed" feeling    Currently in Pain?  No/denies         [facility] PT Assessment - 10/03/19 1542      Standardized Balance Assessment   Standardized Balance Assessment  Berg Balance Test;Dynamic Gait Index      Berg Balance Test   Sit to Stand  Able to stand without using hands and stabilize independently    Standing Unsupported  Able to stand safely 2 minutes    Sitting with Back Unsupported but Feet Supported on Floor or Stool  Able to sit safely and securely 2 minutes    Stand to Sit  Sits safely with minimal use of  hands    Transfers  Able to transfer safely, minor use of hands    Standing Unsupported with Eyes Closed  Able to stand 10 seconds safely    Standing Unsupported with Feet Together  Able to place feet together independently and stand 1 minute safely    From Standing, Reach Forward with Outstretched Arm  Can reach confidently >25 cm (10")    From Standing Position, Pick up Object from Floor  Able to pick up shoe safely and easily    From Standing Position, Turn to Look Behind Over each Shoulder  Looks behind from both sides and weight shifts well    Turn 360 Degrees  Able to turn 360 degrees safely in 4 seconds or less    Standing Unsupported, Alternately Place Feet on Step/Stool  Able to stand independently and safely and complete 8 steps in 20 seconds    Standing Unsupported, One Foot in Front  Able to plae foot ahead of the other independently and hold 30 seconds    Standing on One Leg  Able to lift leg independently and hold equal to or more than 3 seconds    Total Score  53      Dynamic Gait Index   Level Surface  Normal    Change in Gait Speed  Normal    Gait with Horizontal Head Turns  Mild Impairment    Gait with Vertical Head Turns  Normal    Gait and Pivot Turn  Normal    Step Over Obstacle  Normal    Step Around Obstacles  Normal    Steps  Normal    Total Score  23      Functional Gait  Assessment   Gait assessed   Yes    Gait Level Surface  Walks 20 ft in less than 5.5 sec, no assistive devices, good speed, no evidence for imbalance, normal gait pattern, deviates no more than 6 in outside of the 12 in walkway width.    Change in Gait Speed  Able to smoothly change walking speed without loss of balance or gait deviation. Deviate no more than 6 in outside of the 12 in walkway width.    Gait with Horizontal Head Turns  Performs head turns smoothly with slight change in gait velocity (eg, minor disruption to smooth gait path), deviates 6-10 in outside 12 in walkway width, or uses  an assistive device.    Gait with Vertical Head Turns  Performs head turns with no change in gait. Deviates no more than 6 in outside 12 in walkway width.    Gait and Pivot Turn  Pivot turns safely within  3 sec and stops quickly with no loss of balance.    Step Over Obstacle  Is able to step over 2 stacked shoe boxes taped together (9 in total height) without changing gait speed. No evidence of imbalance.    Gait with Narrow Base of Support  Ambulates 4-7 steps.    Gait with Eyes Closed  Walks 20 ft, slow speed, abnormal gait pattern, evidence for imbalance, deviates 10-15 in outside 12 in walkway width. Requires more than 9 sec to ambulate 20 ft.    Ambulating Backwards  Walks 20 ft, no assistive devices, good speed, no evidence for imbalance, normal gait    Steps  Alternating feet, no rail.    Total Score  25         TREATMENT   Neuromuscular Re-education  NuStep L1-2 x 5 minutes for warm-up during history and HEP review Additional outcome measures performed with patient including:  BERG: 53/56 DGI: 23/24 FGA: 25/30 71m gait speed: self selected: 8.1 = 1.23 m/s, fastest: 6.1 = 1.64 m/s 5TSTS: 7.8s TUG: 8.0s VOR x 1 horizontal in standing WBOS x 60s, NBOS x 0000000, NBOS with conflicting background pt denies any dizziness in all positions, she requires verbal and tactile cues to perform correctly with respect to proper speed; Semitandem balance with horizontal slow head turns x 30s each;   Additional outcome measures performed with patient. She scored 53/56 on the BERG, 23/24 on the DGI, and 25/30 on the FGA. 63m gait speed, 5TSTS, and TUG are all WNL. Progressed VOR x 1 horizontal to standing with feet together with conflicting background and pt encouraged to perform this as part of her HEP. Also adde semtandem balance with horizontal head turns. Will continue to progress gaze stabilization and habituation activities at next visit. Pt will benefit from PT services to address deficits in  dizziness in order to return to full function at home.                      PT Short Term Goals - 09/27/19 1226      PT SHORT TERM GOAL #1   Title  Pt will be independent with HEP in order to decrease dizziness and improve function at home.    Time  4    Period  Weeks    Status  New    Target Date  10/25/19        PT Long Term Goals - 09/27/19 1227      PT LONG TERM GOAL #1   Title  Pt will decrease DHI score to 0/100 in order to demonstrate clinically significant reduction in disability related to dizziness    Baseline  09/27/19: 14/100    Time  8    Period  Weeks    Status  New    Target Date  11/22/19      PT LONG TERM GOAL #2   Title  Pt will report no further episodes of feeling "swimmy-headed" when getting out of bed in the morning    Baseline  09/27/19: daily symptoms    Time  8    Period  Weeks    Status  New    Target Date  11/22/19            Plan - 10/03/19 1527    Clinical Impression Statement  Additional outcome measures performed with patient. She scored 53/56 on the BERG, 23/24 on the DGI, and 25/30 on the FGA. 85m gait speed, 5TSTS,  and TUG are all WNL. Progressed VOR x 1 horizontal to standing with feet together with conflicting background and pt encouraged to perform this as part of her HEP. Also adde semtandem balance with horizontal head turns. Will continue to progress gaze stabilization and habituation activities at next visit. Pt will benefit from PT services to address deficits in dizziness in order to return to full function at home.    Personal Factors and Comorbidities  Age;Comorbidity 3+    Comorbidities  HTN, OA, hypothyroidism, giant cell arteritis    Examination-Activity Limitations  Other   Getting out of bed when first waking   Stability/Clinical Decision Making  Stable/Uncomplicated    Clinical Decision Making  Low    Rehab Potential  Good    PT Frequency  1x / week    PT Duration  8 weeks    PT  Treatment/Interventions  ADLs/Self Care Home Management;Aquatic Therapy;Biofeedback;Canalith Repostioning;Cryotherapy;Electrical Stimulation;Iontophoresis 4mg /ml Dexamethasone;Moist Heat;Traction;Ultrasound;DME Instruction;Gait training;Stair training;Functional mobility training;Therapeutic activities;Therapeutic exercise;Balance training;Neuromuscular re-education;Patient/family education;Manual techniques;Passive range of motion;Dry needling;Vestibular;Joint Manipulations    PT Next Visit Plan  Continue with adaptation and habituation exercises    PT Home Exercise Plan  Medbridge Access Code: [email or web address] (VOR x 1 horizontal in sitting)    Consulted and Agree with Plan of Care  Patient       Patient will benefit from skilled therapeutic intervention in order to improve the following deficits and impairments:  Dizziness  Visit Diagnosis: Dizziness and giddiness     Problem List Patient Active Problem List   Diagnosis Date Noted  . SBO (small bowel obstruction) ([place]) 04/18/2019  . Status post total hip replacement, right 03/26/2019  . Temporal arteritis ([place]) 09/03/2018  . Leukocytosis 08/12/2018  . Pneumonia 12/04/2016   Phillips Grout PT, DPT, GCS  Mary Barker 10/04/2019, 1:25 PM  [place] MAIN [facility] SERVICES [address] [place], [place], [address] Phone: [phone]   Fax:  [phone]  Name: Mary Barker MRN: [id number] Date of Birth: 26-Oct-1929

## 2019-10-04 ENCOUNTER — Ambulatory Visit: Payer: PPO

## 2019-10-06 ENCOUNTER — Ambulatory Visit: Payer: PPO | Attending: Internal Medicine

## 2019-10-06 DIAGNOSIS — Z96641 Presence of right artificial hip joint: Secondary | ICD-10-CM | POA: Diagnosis not present

## 2019-10-06 DIAGNOSIS — Z23 Encounter for immunization: Secondary | ICD-10-CM

## 2019-10-06 NOTE — Progress Notes (Signed)
   Covid-19 Vaccination Clinic  Name:  Mary Barker    MRN: [id number] DOB: [birth year]  10/06/2019  Mary Barker was observed post Covid-19 immunization for 15 minutes without incidence. She was provided with Vaccine Information Sheet and instruction to access the V-Safe system.   Mary Barker was instructed to call 911 with any severe reactions post vaccine: Marland Kitchen Difficulty breathing  . Swelling of your face and throat  . A fast heartbeat  . A bad rash all over your body  . Dizziness and weakness    Immunizations Administered    Name Date Dose VIS Date Route   Pfizer COVID-19 Vaccine 10/06/2019 11:41 AM 0.3 mL 08/16/2019 Intramuscular   Manufacturer: [place]   Lot: [id number]   [place]: [id number]

## 2019-10-09 ENCOUNTER — Other Ambulatory Visit: Payer: Self-pay

## 2019-10-09 ENCOUNTER — Ambulatory Visit: Payer: PPO | Attending: Physician Assistant

## 2019-10-09 VITALS — BP 158/59 | HR 82

## 2019-10-09 DIAGNOSIS — R42 Dizziness and giddiness: Secondary | ICD-10-CM

## 2019-10-09 NOTE — Therapy (Signed)
[place] MAIN [facility] SERVICES [address] [place], [place], [address] Phone: [phone]   Fax:  [phone]  Physical Therapy Treatment  Patient Details  Name: Mary Barker MRN: [id number] Date of Birth: October 26, 1929 Referring Provider (PT): Brett Fairy   Encounter Date: 10/09/2019  PT End of Session - 10/09/19 1438    Visit Number  3    Number of Visits  9    Date for PT Re-Evaluation  11/22/19    Authorization Type  eval: 09/27/19    PT Start Time  1432    PT Stop Time  1515    PT Time Calculation (min)  43 min    Equipment Utilized During Treatment  Gait belt    Activity Tolerance  Patient tolerated treatment well    Behavior During Therapy  [facility] for tasks assessed/performed       Past Medical History:  Diagnosis Date  . Carpal tunnel syndrome   . COPD (chronic obstructive pulmonary disease) ([place])   . Dyspnea   . Family history of adverse reaction to anesthesia    sister hard time waking up  . GCA (giant cell arteritis) ([place])   . H/O multiple pulmonary nodules   . Hypertension   . Hyponatremia   . Hypothyroidism   . Osteoarthritis   . Seasonal allergies   . Thyroid disease   . Tingling in extremities     Past Surgical History:  Procedure Laterality Date  . APPENDECTOMY    . ARTERY BIOPSY Left 07/31/2018   Procedure: BIOPSY TEMPORAL ARTERY;  Surgeon: Benjamine Sprague, DO;  Location: [facility] ORS;  Service: General;  Laterality: Left;  . BREAST EXCISIONAL BIOPSY Left 1954   neg  . BREAST SURGERY    . COLONOSCOPY    . DILATION AND CURETTAGE OF UTERUS    . EYE SURGERY Bilateral    cataract  . KNEE ARTHROSCOPY    . LAPAROTOMY N/A 04/20/2019   Procedure: EXPLORATORY LAPAROTOMY Small Bowel Obstruction;  Surgeon: Jules Husbands, MD;  Location: [facility] ORS;  Service: General;  Laterality: N/A;  . NASAL POLYP SURGERY    . TONSILLECTOMY    . TOTAL HIP ARTHROPLASTY Right 03/26/2019   Procedure: RIGHT TOTAL HIP ARTHROPLASTY;   Surgeon: Hessie Knows, MD;  Location: [facility] ORS;  Service: Orthopedics;  Laterality: Right;    Vitals:   10/09/19 1436  BP: (!) 158/59  Pulse: 82  SpO2: 98%    Subjective Assessment - 10/09/19 1430    Subjective  Pt reports that she is feeling a little "off" today. She reports being more "swimmy-headed over the last couple days." She has also had some mild lightheadedness when transitioning from sitting to standing. No pain reported today. No specific questions currently.    Pertinent History  Pt referred by ENT for dizziness. Medical record states VNG positive for peripheral hypofunction but does not specify laterality. Pt reports she was told there was an issue with her R ear. She reports that her symptoms started approximately 5 years ago when she first had BPPV. She was successfully treated with the Epley maneuver. More recently she first had vertigo approximately 1 month ago when she tipped her head back to put in eye drops. She has not had any vertigo since that time but complains of feeling like "my head just isn't right." She reports that she is feeling "swimmy headed" when she first wakes up in the morning for a couple minutes. Occasionally the symptoms will last approximately  5-10 minutes. Once she gets moving the symptoms don't bother her. She is very active with regular walking for exercise. History of bilateral carpal tunnel and she wears night splints. She lives at [facility] and has seen the physical therapist in the past. It sounds like she was prescribed gaze stabilization exercises.    Limitations  Walking    Diagnostic tests  VNG demonstrating peripheral hypofunction    Patient Stated Goals  Decrease "swimmy-headed" feeling    Currently in Pain?  No/denies          TREATMENT   Neuromuscular Re-education  NuStep L2 x 4 minutes for warm-up during history (2 minutes unbilled); VOR x 1 horizontal in standing NBOS with conflicting background x 60s pt  denies any dizziness, no cues required today for proper technique; VOR x 1 horizontal in semitandem with conflicting background x 60s with each foot forward, pt reporting mild increase in dizziness; Semitandem balance with horizontal slow head turns alternating forward LE x 30s each, progressed front foot farther so that it is closer to full tandem x 30s each; 6" Airex step taps without UE support x 10 each; Airex WBOS ball passes around body with head/eye follow with therapist providing return catch over opposite shoulder and varying height from waist to overhead x 10 on each side; Airex NBOS ball passes around body with head/eye follow with therapist providing return catch over opposite shoulder and varying height from waist to overhead x 10 on each side; Forward gait in hallway with horizontal ball passes with therapist while patient performed head/eye follow 75'x 2each direction; Forward gait in hallway with vertical ball lift to self while patient performed head/eye follow 75' x 2; Eyes closed forward gait 75' x 2; Retro gait eyes open 75' x 2; Tandem gait without UE support 20' x 4; Reviewed HEP and added tandem gait to program.    Pt educated throughout session about proper posture and technique with exercises. Improved exercise technique, movement at target joints, use of target muscles after min to mod verbal, visual, tactile cues.    Pt demonstrates excellent motivation during session today. She reports feeling more "swimmy-headed" over the last couple days. During session she reports mild increase in dizziness during VOR x 1 horizontal in semi-tandem position. She also demonstrates some lateral gait deviation with walking head turns during ball passes. Added tandem gait next to counter to HEP. Will continue to progress gaze stabilization and habituation activities at next visit. Pt will benefit from PT services to address deficits in dizziness in order to return to full function at  home.                         PT Short Term Goals - 09/27/19 1226      PT SHORT TERM GOAL #1   Title  Pt will be independent with HEP in order to decrease dizziness and improve function at home.    Time  4    Period  Weeks    Status  New    Target Date  10/25/19        PT Long Term Goals - 09/27/19 1227      PT LONG TERM GOAL #1   Title  Pt will decrease DHI score to 0/100 in order to demonstrate clinically significant reduction in disability related to dizziness    Baseline  09/27/19: 14/100    Time  8    Period  Weeks  Status  New    Target Date  11/22/19      PT LONG TERM GOAL #2   Title  Pt will report no further episodes of feeling "swimmy-headed" when getting out of bed in the morning    Baseline  09/27/19: daily symptoms    Time  8    Period  Weeks    Status  New    Target Date  11/22/19            Plan - 10/09/19 1438    Clinical Impression Statement  t demonstrates excellent motivation during session today. She reports feeling more "swimmy-headed" over the last couple days. During session she reports mild increase in dizziness during VOR x 1 horizontal in semi-tandem position. She also demonstrates some lateral gait deviation with walking head turns during ball passes. Added tandem gait next to counter to HEP. Will continue to progress gaze stabilization and habituation activities at next visit. Pt will benefit from PT services to address deficits in dizziness in order to return to full function at home.    Personal Factors and Comorbidities  Age;Comorbidity 3+    Comorbidities  HTN, OA, hypothyroidism, giant cell arteritis    Examination-Activity Limitations  Other   Getting out of bed when first waking   Stability/Clinical Decision Making  Stable/Uncomplicated    Clinical Decision Making  Low    Rehab Potential  Good    PT Frequency  1x / week    PT Duration  8 weeks    PT Treatment/Interventions  ADLs/Self Care Home  Management;Aquatic Therapy;Biofeedback;Canalith Repostioning;Cryotherapy;Electrical Stimulation;Iontophoresis 4mg /ml Dexamethasone;Moist Heat;Traction;Ultrasound;DME Instruction;Gait training;Stair training;Functional mobility training;Therapeutic activities;Therapeutic exercise;Balance training;Neuromuscular re-education;Patient/family education;Manual techniques;Passive range of motion;Dry needling;Vestibular;Joint Manipulations    PT Next Visit Plan  Continue with adaptation and habituation exercises    PT Home Exercise Plan  Medbridge Access Code: [email or web address] (VOR x 1 horizontal in sitting)    Consulted and Agree with Plan of Care  Patient       Patient will benefit from skilled therapeutic intervention in order to improve the following deficits and impairments:  Dizziness  Visit Diagnosis: Dizziness and giddiness     Problem List Patient Active Problem List   Diagnosis Date Noted  . SBO (small bowel obstruction) ([place]) 04/18/2019  . Status post total hip replacement, right 03/26/2019  . Temporal arteritis ([place]) 09/03/2018  . Leukocytosis 08/12/2018  . Pneumonia 12/04/2016   Phillips Grout PT, DPT, GCS  Kiele Heavrin 10/09/2019, 3:36 PM  [place] MAIN [facility] SERVICES [address] [place], [place], [address] Phone: [phone]   Fax:  [phone]  Name: ESTIE HEGDE MRN: [id number] Date of Birth: 06-08-1930

## 2019-10-09 NOTE — Patient Instructions (Addendum)
Access Code: [email or web address] URL: [email or web address] Date: 10/09/2019 Prepared by: Roxana Hires  Exercises Half Tandem Stance Balance with Head Rotation - 3 reps - 3 x 30s with each foot forward hold - 2x daily - 7x weekly Standing Gaze Stabilization with Head Rotation - 3 reps - 60 seconds hold - 4x daily - 7x weekly Tandem Walking Next to Counter - 3 reps - 30s hold - 2x daily - 7x weekly

## 2019-10-18 ENCOUNTER — Other Ambulatory Visit: Payer: Self-pay

## 2019-10-18 ENCOUNTER — Ambulatory Visit: Payer: PPO

## 2019-10-22 DIAGNOSIS — M316 Other giant cell arteritis: Secondary | ICD-10-CM | POA: Diagnosis not present

## 2019-10-22 DIAGNOSIS — R700 Elevated erythrocyte sedimentation rate: Secondary | ICD-10-CM | POA: Diagnosis not present

## 2019-10-23 ENCOUNTER — Ambulatory Visit: Payer: PPO

## 2019-11-03 DIAGNOSIS — Z96641 Presence of right artificial hip joint: Secondary | ICD-10-CM | POA: Diagnosis not present

## 2019-11-06 DIAGNOSIS — M545 Low back pain: Secondary | ICD-10-CM | POA: Diagnosis not present

## 2019-11-06 DIAGNOSIS — M25511 Pain in right shoulder: Secondary | ICD-10-CM | POA: Diagnosis not present

## 2019-11-06 DIAGNOSIS — G8929 Other chronic pain: Secondary | ICD-10-CM | POA: Diagnosis not present

## 2019-11-11 DIAGNOSIS — M545 Low back pain: Secondary | ICD-10-CM | POA: Diagnosis not present

## 2019-11-11 DIAGNOSIS — S43491D Other sprain of right shoulder joint, subsequent encounter: Secondary | ICD-10-CM | POA: Diagnosis not present

## 2019-11-13 ENCOUNTER — Ambulatory Visit: Payer: PPO

## 2019-11-13 DIAGNOSIS — M545 Low back pain: Secondary | ICD-10-CM | POA: Diagnosis not present

## 2019-11-13 DIAGNOSIS — S43491D Other sprain of right shoulder joint, subsequent encounter: Secondary | ICD-10-CM | POA: Diagnosis not present

## 2019-11-17 IMAGING — MG DIGITAL SCREENING BILAT W/ TOMO
6 of 10 series · 6 of 30 positions shown · non-contrast
Comparison: None.

ACR Breast Density Category a: The breast tissue is almost entirely
fatty.

CLINICAL DATA: Screening.

EXAM:
DIGITAL SCREENING BILATERAL MAMMOGRAM WITH TOMO AND CAD

[R MLO synth-2D]
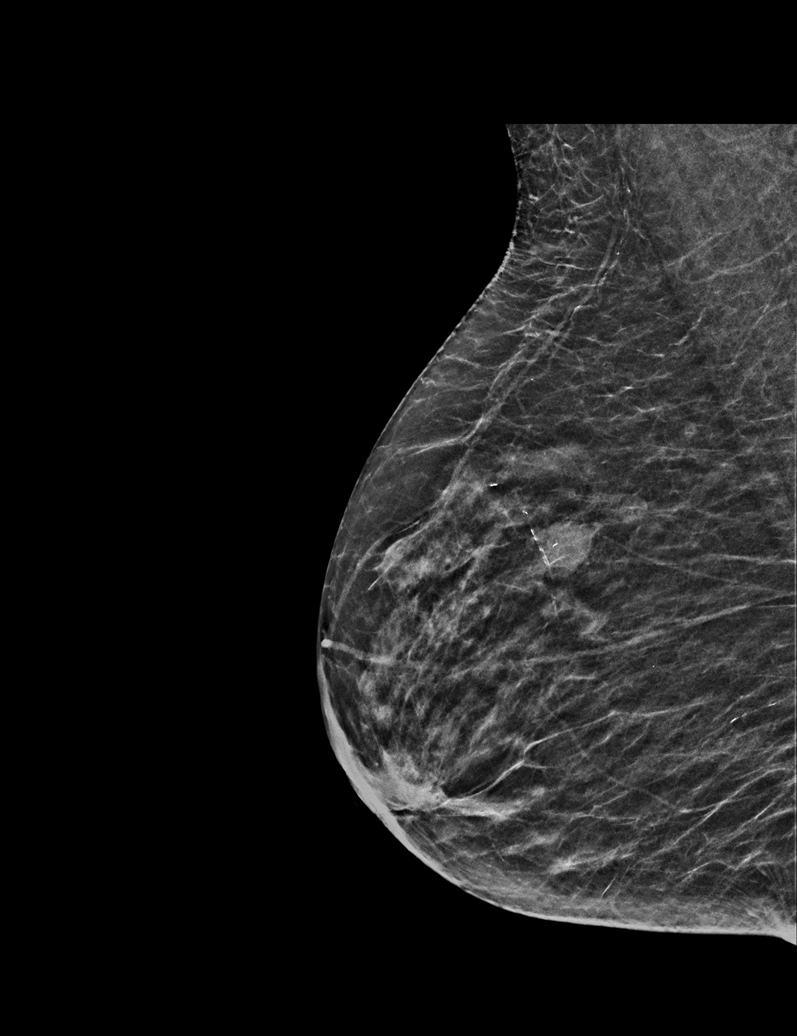

[L MLO synth-2D (1 of 2)]
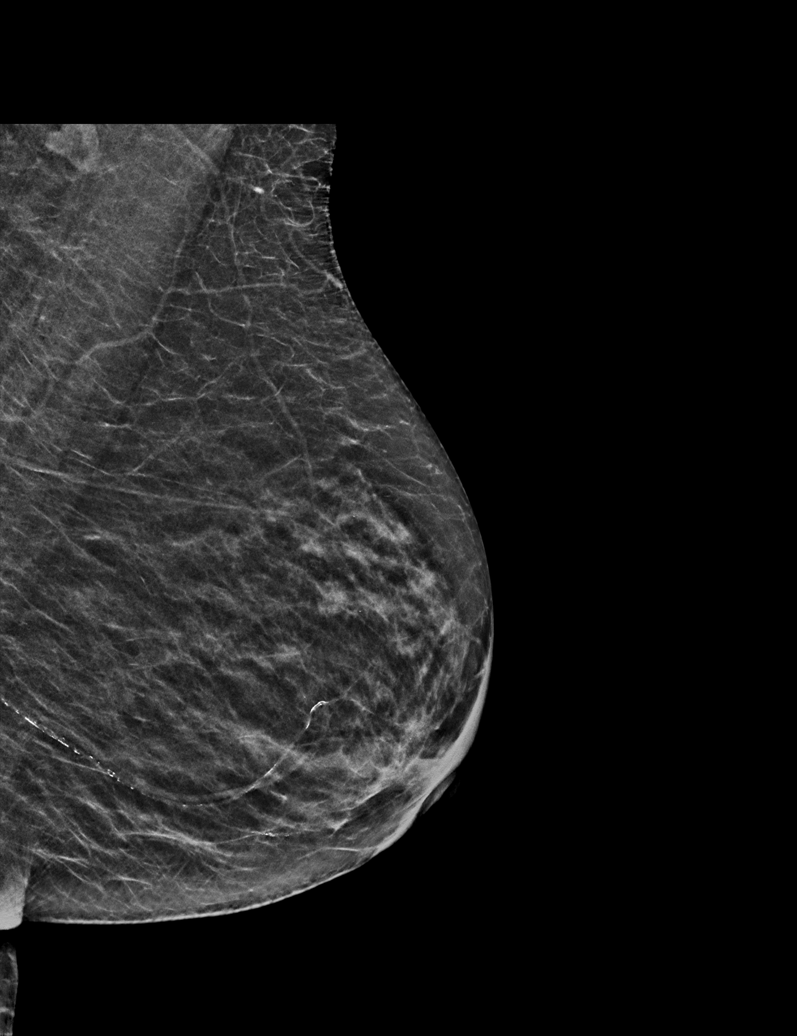

[L CC synth-2D]
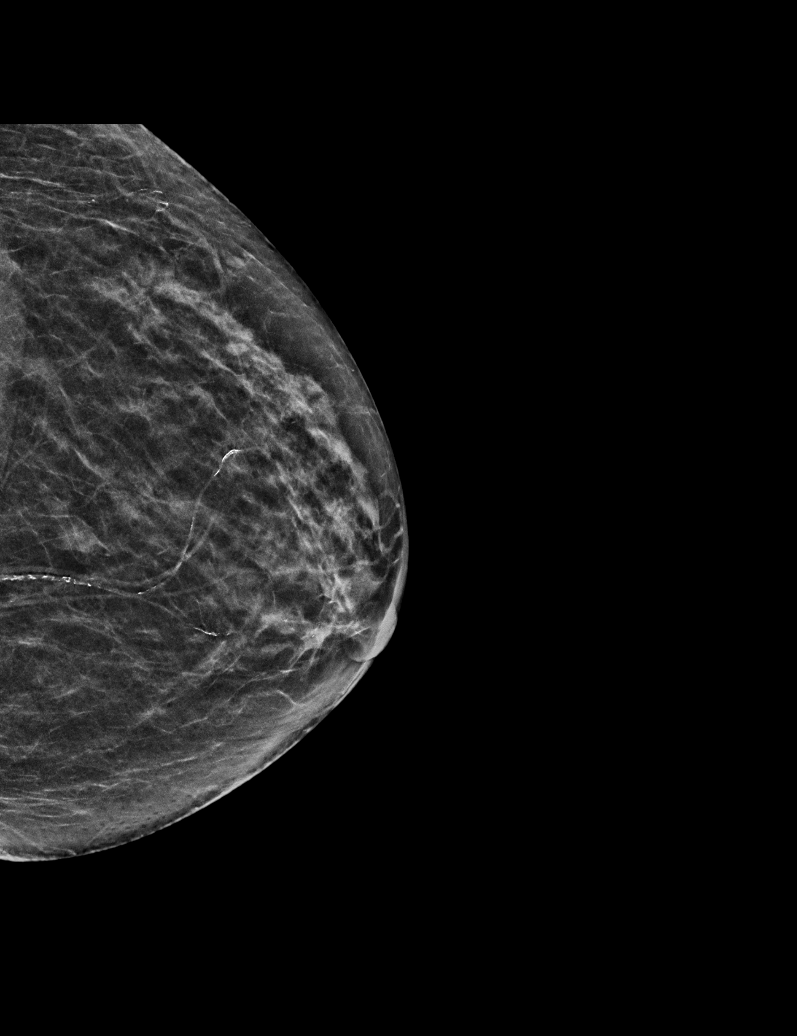

[R CC synth-2D]
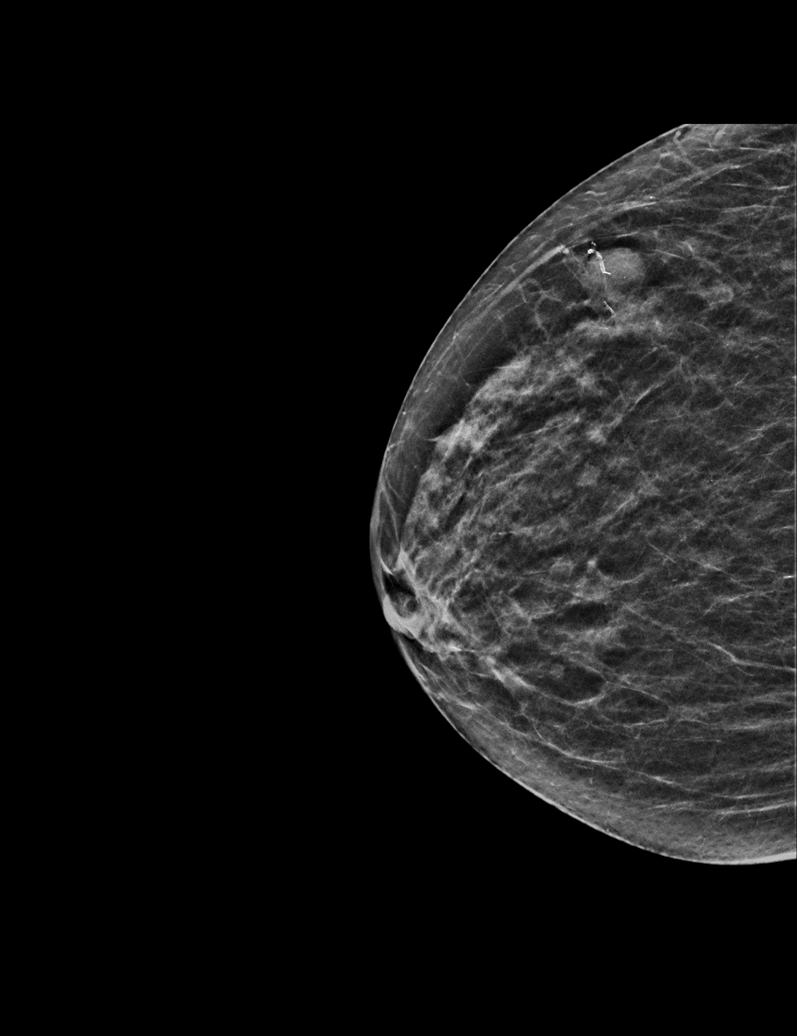

[L MLO synth-2D (2 of 2)]
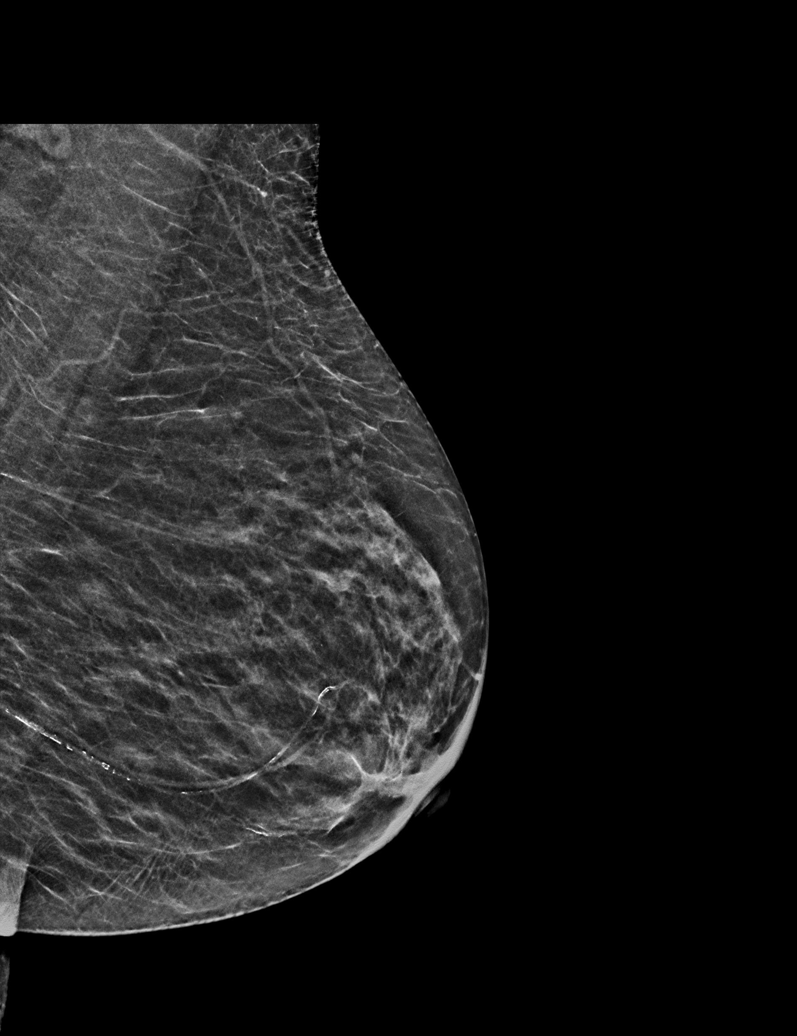

[R CC tomo · tomo slice 20/39.0]
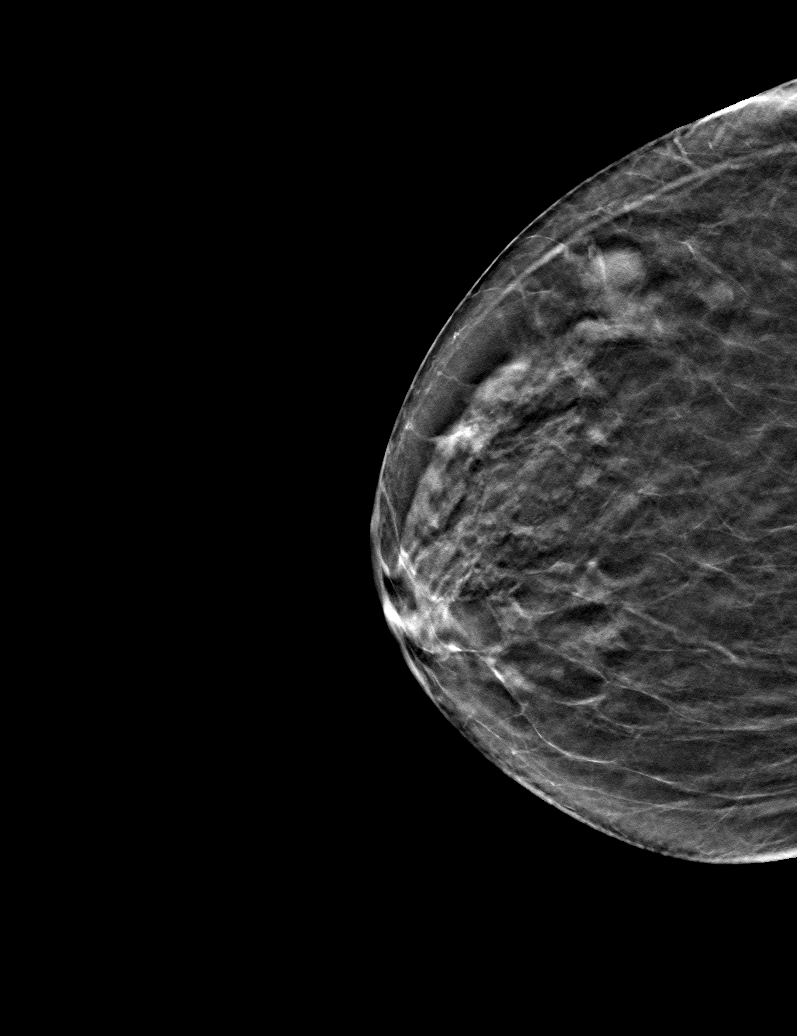

[6 of 30 positions shown; findings below may reference images not displayed]

FINDINGS: There are no findings suspicious for malignancy. Images were
processed with CAD.
IMPRESSION: No mammographic evidence of malignancy. A result letter of this
screening mammogram will be mailed directly to the patient.

RECOMMENDATION:
Screening mammogram in one year. (Code:[id number])

BI-RADS CATEGORY  1: Negative.

## 2019-11-18 DIAGNOSIS — S43491D Other sprain of right shoulder joint, subsequent encounter: Secondary | ICD-10-CM | POA: Diagnosis not present

## 2019-11-19 DIAGNOSIS — M316 Other giant cell arteritis: Secondary | ICD-10-CM | POA: Diagnosis not present

## 2019-11-19 DIAGNOSIS — H353211 Exudative age-related macular degeneration, right eye, with active choroidal neovascularization: Secondary | ICD-10-CM | POA: Diagnosis not present

## 2019-11-20 DIAGNOSIS — S43491D Other sprain of right shoulder joint, subsequent encounter: Secondary | ICD-10-CM | POA: Diagnosis not present

## 2019-11-25 DIAGNOSIS — S43491D Other sprain of right shoulder joint, subsequent encounter: Secondary | ICD-10-CM | POA: Diagnosis not present

## 2019-11-28 DIAGNOSIS — M545 Low back pain: Secondary | ICD-10-CM | POA: Diagnosis not present

## 2019-11-29 DIAGNOSIS — H6123 Impacted cerumen, bilateral: Secondary | ICD-10-CM | POA: Diagnosis not present

## 2019-11-29 DIAGNOSIS — H8111 Benign paroxysmal vertigo, right ear: Secondary | ICD-10-CM | POA: Diagnosis not present

## 2019-12-04 DIAGNOSIS — Z96641 Presence of right artificial hip joint: Secondary | ICD-10-CM | POA: Diagnosis not present

## 2019-12-04 DIAGNOSIS — H8111 Benign paroxysmal vertigo, right ear: Secondary | ICD-10-CM | POA: Diagnosis not present

## 2019-12-06 DIAGNOSIS — S43491D Other sprain of right shoulder joint, subsequent encounter: Secondary | ICD-10-CM | POA: Diagnosis not present

## 2019-12-12 DIAGNOSIS — S43491D Other sprain of right shoulder joint, subsequent encounter: Secondary | ICD-10-CM | POA: Diagnosis not present

## 2019-12-23 DIAGNOSIS — M316 Other giant cell arteritis: Secondary | ICD-10-CM | POA: Diagnosis not present

## 2019-12-26 DIAGNOSIS — M810 Age-related osteoporosis without current pathological fracture: Secondary | ICD-10-CM | POA: Diagnosis not present

## 2019-12-26 DIAGNOSIS — M25511 Pain in right shoulder: Secondary | ICD-10-CM | POA: Diagnosis not present

## 2019-12-26 DIAGNOSIS — G8929 Other chronic pain: Secondary | ICD-10-CM | POA: Diagnosis not present

## 2019-12-26 DIAGNOSIS — M8949 Other hypertrophic osteoarthropathy, multiple sites: Secondary | ICD-10-CM | POA: Diagnosis not present

## 2019-12-26 DIAGNOSIS — M316 Other giant cell arteritis: Secondary | ICD-10-CM | POA: Diagnosis not present

## 2019-12-31 DIAGNOSIS — M316 Other giant cell arteritis: Secondary | ICD-10-CM | POA: Diagnosis not present

## 2019-12-31 DIAGNOSIS — G5603 Carpal tunnel syndrome, bilateral upper limbs: Secondary | ICD-10-CM | POA: Diagnosis not present

## 2019-12-31 DIAGNOSIS — R531 Weakness: Secondary | ICD-10-CM | POA: Diagnosis not present

## 2019-12-31 DIAGNOSIS — R42 Dizziness and giddiness: Secondary | ICD-10-CM | POA: Diagnosis not present

## 2020-01-03 ENCOUNTER — Other Ambulatory Visit: Payer: Self-pay | Admitting: Neurology

## 2020-01-03 ENCOUNTER — Other Ambulatory Visit (HOSPITAL_COMMUNITY): Payer: Self-pay | Admitting: Neurology

## 2020-01-03 DIAGNOSIS — Z96641 Presence of right artificial hip joint: Secondary | ICD-10-CM | POA: Diagnosis not present

## 2020-01-03 DIAGNOSIS — R42 Dizziness and giddiness: Secondary | ICD-10-CM

## 2020-01-09 DIAGNOSIS — M19011 Primary osteoarthritis, right shoulder: Secondary | ICD-10-CM | POA: Diagnosis not present

## 2020-01-09 DIAGNOSIS — M75101 Unspecified rotator cuff tear or rupture of right shoulder, not specified as traumatic: Secondary | ICD-10-CM | POA: Diagnosis not present

## 2020-01-16 ENCOUNTER — Other Ambulatory Visit: Payer: Self-pay

## 2020-01-16 ENCOUNTER — Ambulatory Visit
Admission: RE | Admit: 2020-01-16 | Discharge: 2020-01-16 | Disposition: A | Discharge status: Home / Self Care | Payer: PPO | Source: Ambulatory Visit | Attending: Neurology | Admitting: Neurology

## 2020-01-16 DIAGNOSIS — R42 Dizziness and giddiness: Secondary | ICD-10-CM | POA: Diagnosis not present

## 2020-01-16 MED ORDER — GADOBUTROL 1 MMOL/ML IV SOLN
5.0000 mL | Freq: Once | INTRAVENOUS | Status: AC | PRN
  Administered 2020-01-16: 5 mL via INTRAVENOUS

## 2020-01-22 DIAGNOSIS — M25511 Pain in right shoulder: Secondary | ICD-10-CM | POA: Diagnosis not present

## 2020-01-22 DIAGNOSIS — M316 Other giant cell arteritis: Secondary | ICD-10-CM | POA: Diagnosis not present

## 2020-01-22 DIAGNOSIS — M19011 Primary osteoarthritis, right shoulder: Secondary | ICD-10-CM | POA: Diagnosis not present

## 2020-01-22 DIAGNOSIS — G8929 Other chronic pain: Secondary | ICD-10-CM | POA: Diagnosis not present

## 2020-01-27 DIAGNOSIS — S43491D Other sprain of right shoulder joint, subsequent encounter: Secondary | ICD-10-CM | POA: Diagnosis not present

## 2020-01-30 DIAGNOSIS — G5603 Carpal tunnel syndrome, bilateral upper limbs: Secondary | ICD-10-CM | POA: Diagnosis not present

## 2020-02-03 DIAGNOSIS — Z96641 Presence of right artificial hip joint: Secondary | ICD-10-CM | POA: Diagnosis not present

## 2020-02-05 DIAGNOSIS — S43491D Other sprain of right shoulder joint, subsequent encounter: Secondary | ICD-10-CM | POA: Diagnosis not present

## 2020-02-06 DIAGNOSIS — R569 Unspecified convulsions: Secondary | ICD-10-CM | POA: Diagnosis not present

## 2020-02-11 DIAGNOSIS — M316 Other giant cell arteritis: Secondary | ICD-10-CM | POA: Diagnosis not present

## 2020-02-11 DIAGNOSIS — I10 Essential (primary) hypertension: Secondary | ICD-10-CM | POA: Diagnosis not present

## 2020-02-11 DIAGNOSIS — R42 Dizziness and giddiness: Secondary | ICD-10-CM | POA: Diagnosis not present

## 2020-02-11 DIAGNOSIS — R0602 Shortness of breath: Secondary | ICD-10-CM | POA: Diagnosis not present

## 2020-02-19 DIAGNOSIS — S43491D Other sprain of right shoulder joint, subsequent encounter: Secondary | ICD-10-CM | POA: Diagnosis not present

## 2020-02-25 DIAGNOSIS — H353211 Exudative age-related macular degeneration, right eye, with active choroidal neovascularization: Secondary | ICD-10-CM | POA: Diagnosis not present

## 2020-03-17 DIAGNOSIS — R0600 Dyspnea, unspecified: Secondary | ICD-10-CM | POA: Diagnosis not present

## 2020-03-17 DIAGNOSIS — J439 Emphysema, unspecified: Secondary | ICD-10-CM | POA: Diagnosis not present

## 2020-03-23 DIAGNOSIS — M19011 Primary osteoarthritis, right shoulder: Secondary | ICD-10-CM | POA: Diagnosis not present

## 2020-03-23 DIAGNOSIS — M25511 Pain in right shoulder: Secondary | ICD-10-CM | POA: Diagnosis not present

## 2020-03-23 DIAGNOSIS — G8929 Other chronic pain: Secondary | ICD-10-CM | POA: Diagnosis not present

## 2020-03-30 DIAGNOSIS — G5603 Carpal tunnel syndrome, bilateral upper limbs: Secondary | ICD-10-CM | POA: Diagnosis not present

## 2020-03-30 DIAGNOSIS — R42 Dizziness and giddiness: Secondary | ICD-10-CM | POA: Diagnosis not present

## 2020-03-30 DIAGNOSIS — M65341 Trigger finger, right ring finger: Secondary | ICD-10-CM | POA: Diagnosis not present

## 2020-04-01 DIAGNOSIS — M25511 Pain in right shoulder: Secondary | ICD-10-CM | POA: Diagnosis not present

## 2020-04-01 DIAGNOSIS — M19011 Primary osteoarthritis, right shoulder: Secondary | ICD-10-CM | POA: Diagnosis not present

## 2020-04-01 DIAGNOSIS — G8929 Other chronic pain: Secondary | ICD-10-CM | POA: Diagnosis not present

## 2020-04-02 NOTE — Therapy (Signed)
[place] MAIN [facility] SERVICES [address] [place], [place], [address] Phone: [phone]   Fax:  [phone]  Patient Details  Name: Mary Barker MRN: [id number] Date of Birth: 07/10/1930 Referring Provider:  Arturo Morton, Mamie Nick*  Encounter Date: 10/18/2019  Arrived in error. Appt cancelled  Phillips Grout PT, DPT, GCS  Kynley Metzger 04/02/2020, 2:53 PM  [place] MAIN [facility] SERVICES [address] [place], [place], [address] Phone: [phone]   Fax:  [phone]

## 2020-04-08 DIAGNOSIS — M19011 Primary osteoarthritis, right shoulder: Secondary | ICD-10-CM | POA: Diagnosis not present

## 2020-04-08 DIAGNOSIS — G8929 Other chronic pain: Secondary | ICD-10-CM | POA: Diagnosis not present

## 2020-04-08 DIAGNOSIS — M25511 Pain in right shoulder: Secondary | ICD-10-CM | POA: Diagnosis not present

## 2020-04-15 DIAGNOSIS — M25511 Pain in right shoulder: Secondary | ICD-10-CM | POA: Diagnosis not present

## 2020-04-15 DIAGNOSIS — M19011 Primary osteoarthritis, right shoulder: Secondary | ICD-10-CM | POA: Diagnosis not present

## 2020-04-15 DIAGNOSIS — G8929 Other chronic pain: Secondary | ICD-10-CM | POA: Diagnosis not present

## 2020-04-23 DIAGNOSIS — G5602 Carpal tunnel syndrome, left upper limb: Secondary | ICD-10-CM | POA: Diagnosis not present

## 2020-04-27 DIAGNOSIS — M25552 Pain in left hip: Secondary | ICD-10-CM | POA: Diagnosis not present

## 2020-04-27 DIAGNOSIS — G5702 Lesion of sciatic nerve, left lower limb: Secondary | ICD-10-CM | POA: Diagnosis not present

## 2020-04-28 DIAGNOSIS — G5602 Carpal tunnel syndrome, left upper limb: Secondary | ICD-10-CM | POA: Diagnosis not present

## 2020-05-04 DIAGNOSIS — R42 Dizziness and giddiness: Secondary | ICD-10-CM | POA: Diagnosis not present

## 2020-05-11 DIAGNOSIS — H8113 Benign paroxysmal vertigo, bilateral: Secondary | ICD-10-CM | POA: Diagnosis not present

## 2020-05-13 DIAGNOSIS — H8113 Benign paroxysmal vertigo, bilateral: Secondary | ICD-10-CM | POA: Diagnosis not present

## 2020-05-15 DIAGNOSIS — H8113 Benign paroxysmal vertigo, bilateral: Secondary | ICD-10-CM | POA: Diagnosis not present

## 2020-05-19 DIAGNOSIS — H8113 Benign paroxysmal vertigo, bilateral: Secondary | ICD-10-CM | POA: Diagnosis not present

## 2020-05-25 DIAGNOSIS — H8113 Benign paroxysmal vertigo, bilateral: Secondary | ICD-10-CM | POA: Diagnosis not present

## 2020-05-26 ENCOUNTER — Ambulatory Visit: Payer: PPO | Admitting: Dermatology

## 2020-05-26 ENCOUNTER — Other Ambulatory Visit: Payer: Self-pay

## 2020-05-26 DIAGNOSIS — L578 Other skin changes due to chronic exposure to nonionizing radiation: Secondary | ICD-10-CM

## 2020-05-26 DIAGNOSIS — D692 Other nonthrombocytopenic purpura: Secondary | ICD-10-CM | POA: Diagnosis not present

## 2020-05-26 DIAGNOSIS — L820 Inflamed seborrheic keratosis: Secondary | ICD-10-CM

## 2020-05-26 DIAGNOSIS — L821 Other seborrheic keratosis: Secondary | ICD-10-CM

## 2020-05-26 DIAGNOSIS — Z85828 Personal history of other malignant neoplasm of skin: Secondary | ICD-10-CM

## 2020-05-26 DIAGNOSIS — T148XXA Other injury of unspecified body region, initial encounter: Secondary | ICD-10-CM

## 2020-05-26 DIAGNOSIS — D1800 Hemangioma unspecified site: Secondary | ICD-10-CM | POA: Diagnosis not present

## 2020-05-26 DIAGNOSIS — S90512A Abrasion, left ankle, initial encounter: Secondary | ICD-10-CM

## 2020-05-26 NOTE — Progress Notes (Signed)
Follow-Up Visit   Subjective  Mary Barker is a 86 y.o. female who presents for the following: Lesion.  Patient here today for a spot on her scalp. Patient's hairdresser and another person both made a comment about a dark spot. She does have a history of Moh's at nasal tip and at scalp. She does have a history of BCC, AK's.  She has some other scaly spots that she picks at.  The following portions of the chart were reviewed this encounter and updated as appropriate:      Review of Systems:  No other skin or systemic complaints except as noted in HPI or Assessment and Plan.  Objective  Well appearing patient in no apparent distress; mood and affect are within normal limits.  A focused examination was performed including face, neck, chest and back and scalp, arms, legs. Relevant physical exam findings are noted in the Assessment and Plan.  Objective  L vertex scalp x 1, R inf jaw x 2, L postauricular neck x 5, L preauricular x 1, L nasal ala x 1, L cheek x 1, L lower cheek x 1, L pretibia x 1 (13): 25mm waxy brown macule at left vertex scalp Erythematous keratotic or waxy stuck-on papule  Objective  Left Ankle - Anterior: Healing excoriation with surrounding small pink papules   Assessment & Plan  Inflamed seborrheic keratosis (13) L vertex scalp x 1, R inf jaw x 2, L postauricular neck x 5, L preauricular x 1, L nasal ala x 1, L cheek x 1, L lower cheek x 1, L pretibia x 1  Recheck left vertex scalp at follow up Location at right lateral ankle not treated with LN2 today due to location, will treat with Vaseline and bandaid and recheck on f/up  Destruction of lesion - L vertex scalp x 1, R inf jaw x 2, L postauricular neck x 5, L preauricular x 1, L nasal ala x 1, L cheek x 1, L lower cheek x 1, L pretibia x 1 Complexity: simple   Destruction method: cryotherapy   Informed consent: discussed and consent obtained   Lesion destroyed using liquid nitrogen: Yes   Region frozen  until ice ball extended beyond lesion: Yes   Outcome: patient tolerated procedure well with no complications   Post-procedure details: wound care instructions given    Excoriation Left Ankle - Anterior  History of shaving trauma Healing excoriation with dermatitis possibly from OTC abx ointment Recommend Vaseline and bandaid, d/c antibiotic ointment   History of Basal Cell Carcinoma of the Skin - No evidence of recurrence today at right upper occipital scalp, basosquamous at nasal tip - Recommend regular full body skin exams - Recommend daily broad spectrum sunscreen SPF 30+ to sun-exposed areas, reapply every 2 hours as needed.  - Call if any new or changing lesions are noted between office visits  Seborrheic Keratoses - Stuck-on, waxy, tan-brown papules and plaques  - Discussed benign etiology and prognosis. - Observe - Call for any changes  Actinic Damage - diffuse scaly erythematous macules with underlying dyspigmentation - Recommend daily broad spectrum sunscreen SPF 30+ to sun-exposed areas, reapply every 2 hours as needed.  - Call for new or changing lesions.  Hemangiomas - Red papules - Discussed benign nature - Observe - Call for any changes  Purpura - Violaceous macules and patches at arms - Benign - Related to age, sun damage and/or use of blood thinners - Observe - Can use OTC arnica containing moisturizer  such as Dermend Bruise Formula if desired - Call for worsening or other concerns  Return in about 2 months (around 07/26/2020) for ISK follow up.  Graciella Belton, RMA, am acting as scribe for Brendolyn Patty, MD . Documentation: I have reviewed the above documentation for accuracy and completeness, and I agree with the above.  Brendolyn Patty MD

## 2020-05-26 NOTE — Patient Instructions (Signed)
Cryotherapy Aftercare  . Wash gently with soap and water everyday.   . Apply Vaseline and Band-Aid daily until healed.  

## 2020-05-27 DIAGNOSIS — H353211 Exudative age-related macular degeneration, right eye, with active choroidal neovascularization: Secondary | ICD-10-CM | POA: Diagnosis not present

## 2020-06-02 DIAGNOSIS — H353211 Exudative age-related macular degeneration, right eye, with active choroidal neovascularization: Secondary | ICD-10-CM | POA: Diagnosis not present

## 2020-06-03 DIAGNOSIS — H8113 Benign paroxysmal vertigo, bilateral: Secondary | ICD-10-CM | POA: Diagnosis not present

## 2020-06-08 DIAGNOSIS — H8113 Benign paroxysmal vertigo, bilateral: Secondary | ICD-10-CM | POA: Diagnosis not present

## 2020-06-15 DIAGNOSIS — Z0000 Encounter for general adult medical examination without abnormal findings: Secondary | ICD-10-CM | POA: Diagnosis not present

## 2020-06-29 ENCOUNTER — Other Ambulatory Visit: Payer: Self-pay

## 2020-06-29 ENCOUNTER — Encounter: Payer: Self-pay | Admitting: Physical Therapy

## 2020-06-29 ENCOUNTER — Ambulatory Visit: Payer: PPO | Attending: Otolaryngology | Admitting: Physical Therapy

## 2020-06-29 DIAGNOSIS — R42 Dizziness and giddiness: Secondary | ICD-10-CM | POA: Diagnosis not present

## 2020-06-29 NOTE — Therapy (Signed)
[place] MAIN [facility] SERVICES [address] [place], [place], [address] Phone: [phone]   Fax:  [phone]  Physical Therapy Evaluation  Patient Details  Name: Mary Barker MRN: [id number] Date of Birth: [birth year] Referring Provider (PT): Dr. Richardson Landry   Encounter Date: 06/29/2020   PT End of Session - 06/29/20 1401    Visit Number 1    Number of Visits 9    Date for PT Re-Evaluation 08/24/20    Authorization Type Eval 06/29/2020    PT Start Time 1401    PT Stop Time 1515    PT Time Calculation (min) 74 min    Equipment Utilized During Treatment Gait belt    Activity Tolerance Patient tolerated treatment well    Behavior During Therapy [facility] for tasks assessed/performed           Past Medical History:  Diagnosis Date  . Basal cell carcinoma 10/06/2015   Left nasal tip   . Carpal tunnel syndrome   . COPD (chronic obstructive pulmonary disease) ([place])   . Dyspnea   . Family history of adverse reaction to anesthesia    sister hard time waking up  . GCA (giant cell arteritis) ([place])   . H/O multiple pulmonary nodules   . Hypertension   . Hyponatremia   . Hypothyroidism   . Osteoarthritis   . Seasonal allergies   . Thyroid disease   . Tingling in extremities     Past Surgical History:  Procedure Laterality Date  . APPENDECTOMY    . ARTERY BIOPSY Left 07/31/2018   Procedure: BIOPSY TEMPORAL ARTERY;  Surgeon: Benjamine Sprague, DO;  Location: [facility] ORS;  Service: General;  Laterality: Left;  . BREAST EXCISIONAL BIOPSY Left 1954   neg  . BREAST SURGERY    . COLONOSCOPY    . DILATION AND CURETTAGE OF UTERUS    . EYE SURGERY Bilateral    cataract  . KNEE ARTHROSCOPY    . LAPAROTOMY N/A 04/20/2019   Procedure: EXPLORATORY LAPAROTOMY Small Bowel Obstruction;  Surgeon: Jules Husbands, MD;  Location: [facility] ORS;  Service: General;  Laterality: N/A;  . NASAL POLYP SURGERY    . TONSILLECTOMY    . TOTAL HIP ARTHROPLASTY Right 03/26/2019    Procedure: RIGHT TOTAL HIP ARTHROPLASTY;  Surgeon: Hessie Knows, MD;  Location: [facility] ORS;  Service: Orthopedics;  Laterality: Right;    There were no vitals filed for this visit.    Subjective Assessment - 06/29/20 2119    Subjective Patient reports that she is having a difficult day in regards to her dizziness symptoms.  Patient states that her head just does not feel right.  Patient denies vertigo this date.    Pertinent History History obtained from patient report and medical record.  Patient was referred for vestibular therapy by Dr. Richardson Landry, ENT physician.  Patient has approximately 5-year history of intermittent episodes of dizziness and vertigo and a history of BPPV. Patient reports in January, she had vertigo and BPPV. In [place] she saw Dr. Richardson Landry and Brett Fairy PA and received treatment maneuvers for right BPPV.  Patient was referred to neurology.  Per medical record, patient had a positive VNG for peripheral hypofunction, but does not specify laterality. Patient states that she has intermittent dizziness symptoms. Patient reports she gets lightheadedness especially when she gets out of bed. Patient states she states she sits for 10 seconds or more and again when she first stands up. Patient reports that the mornings are more  challenging due to dizziness and imbalance. Patient reports she had 2 episodes of vertigo in the past 2 months and she had to grab hold for support when she was getting out of bed. Patient said she saw Eden Emms, PT and states she saw him for therapy and states she is not doing the exercises currently.  Patient describes Brant-Daroff exercises and states she does these occasionally and at times this brings on her dizziness symptoms. Patient reports changes in position and head position bring on her dizziness and states she gets vertigo if she tries to tip her head back when putting in eye drops. Patient reports rolling over in bed sometimes brings on her symptoms,  but states it is not drastic. Patient reports the more active she is during the day helps to decrease her dizziness. Patient reports walking helps her to feel better.  Patient reports she walks twice a day for three quarters of a mile each.  Patient states that her head just does not feel right.  Patient reports her symptoms of dizziness can last seconds or longer before she can function and states then her "function is not up to par".    Diagnostic tests VNG demonstrating peripheral hypofunction per medical record    Patient Stated Goals To have decreased dizziness and lightheadedness    Currently in Pain? No/denies              [facility] PT Assessment - 06/29/20 1432      Assessment   Medical Diagnosis Dizziness and giddiness    Referring Provider (PT) Dr. Richardson Landry    Prior Therapy Previous vestibular therapy for BPPV at [facility]      Precautions   Precautions Fall      Restrictions   Weight Bearing Restrictions No      Balance Screen   Has the patient fallen in the past 6 months No    Has the patient had a decrease in activity level because of a fear of falling?  No    Is the patient reluctant to leave their home because of a fear of falling?  No      Home Ecologist residence   Independent living at [facility] at Discharge Friend(s);Family    Type of Home Independent living facility   Independent cottage at [facility] One level      Prior Function   Level of Independence Independent with community mobility without device    Leisure Walking, reading (fiction, mystery), puzzles      Cognition   Overall Cognitive Status Within Functional Limits for tasks assessed      Posture/Postural Control   Posture/Postural Control No significant limitations      Standardized Balance Assessment   Standardized Balance Assessment Dynamic Gait Index      Dynamic Gait Index   Level Surface  Normal    Change in Gait Speed Normal    Gait with Horizontal Head Turns Normal    Gait with Vertical Head Turns Normal    Gait and Pivot Turn Normal    Step Over Obstacle Mild Impairment    Step Around Obstacles Normal    Steps Normal    Total Score 23             VESTIBULAR AND BALANCE EVALUATION   HISTORY:  Subjective history of current problem: History obtained from patient report and medical record.  Patient was referred for vestibular therapy by Dr. Richardson Landry, ENT physician.  Patient has approximately 5-year history of intermittent episodes of dizziness and vertigo and a history of BPPV. Patient reports in January, she had vertigo and BPPV. In [place] she saw Dr. Richardson Landry and Brett Fairy PA and received treatment maneuvers for right BPPV.  Patient was referred to neurology.  Per medical record, patient had a positive VNG for peripheral hypofunction, but does not specify laterality. Patient states that she has intermittent dizziness symptoms. Patient reports she gets lightheadedness especially when she gets out of bed. Patient states she states she sits for 10 seconds or more and again when she first stands up. Patient reports that the mornings are more challenging due to dizziness and imbalance. Patient reports she had 2 episodes of vertigo in the past 2 months and she had to grab hold for support when she was getting out of bed. Patient said she saw Eden Emms, PT and states she saw him for therapy and states she is not doing the exercises currently.  Patient describes Brant-Daroff exercises and states she does these occasionally and at times this brings on her dizziness symptoms. Patient reports changes in position and head position bring on her dizziness and states she gets vertigo if she tries to tip her head back when putting in eye drops. Patient reports rolling over in bed sometimes brings on her symptoms, but states it is not drastic. Patient reports the more active she is during  the day helps to decrease her dizziness. Patient reports walking helps her to feel better.  Patient reports she walks twice a day for three quarters of a mile each.  Patient states that her head just does not feel right.  Patient reports her symptoms of dizziness can last seconds or longer before she can function and states then her "function is not up to par".  Description of dizziness: vertigo, unsteadiness, lightheadedness, swimmy-headed sensation Frequency: daily especially in the morning and in the middle of the night if she gets up Duration: seconds before she can function and states her "function is not up to par" Symptom nature: motion provoked, positional, variable, intermittent  Progression of symptoms: better History of similar episodes: yes  Falls (yes/no): no Number of falls in past 6 months:  0  Auditory complaints (tinnitus, pain, drainage): denies Vision (last eye exam, diplopia, recent changes): wear glasses, last eye exam was last month; patient reports she feels she lacks focus for distance and the eye doctor said her prescription did not change and they felt it was due to light. Reports she has macular degeneration.   Current Symptoms: (dysarthria, dysphagia, drop attacks, bowel and bladder changes, recent weight loss/gain)    Review of systems negative for red flags. Patient reports she lost 20 pounds due to giant cell arteritis (GCA) and was on prednisone. States she as stabilized since then.     EXAMINATION       COORDINATION: Finger to Nose:  Normal Past Pointing:  Normal  MUSCULOSKELETAL SCREEN: Cervical Spine ROM: Within functional limits cervical active range of motion flexion, extension, right and left rotation without discomfort.   Functional Mobility: Independent with transfers.  Gait: Patient arrives ambulating without AD. Patient ambulates with fair cadence with step through gait pattern with reciprocal arm movement and fair scanning of the visual  environment. Patient able to change gait speed without loss of balance.  Balance: Patient is challenged by narrow base of support, eyes closed, uneven surfaces and single-leg  stance activities.  POSTURAL CONTROL TESTS:  Clinical Test of Sensory Interaction for Balance (CTSIB):  CONDITION TIME STRATEGY SWAY  Eyes open, firm surface 30 seconds ankle   Eyes closed, firm surface 30 seconds ankle +1  Eyes open, foam surface 30 seconds ankle +1  Eyes closed, foam surface 30 seconds ankle +3    OCULOMOTOR / VESTIBULAR TESTING: Oculomotor Exam- Room Light  Normal Abnormal Comments  Ocular Alignment N    Ocular ROM N    Spontaneous Nystagmus N    Gaze evoked Nystagmus  Abn Noted left beating nystagmus in left gaze  Smooth Pursuit  Abn Multiple corrective saccades noted  Saccades  abn Hypometric saccades noted all fields  VOR N  Denies dizziness and blurring of target  VOR Cancellation N    Left Head Impulse N   difficult to accurately test as patient had difficulty relaxing neck for testing  Right Head Impulse N   difficult to accurately test as patient had difficulty relaxing neck for testing  Head Shaking Nystagmus    deferred  Static Acuity    deferred  Dynamic Acuity    deferred    BPPV TESTS:  Symptoms Duration Intensity Nystagmus  Left Dix-Hallpike None; states head does not feel quite right, no vertigo  N/A None observed  Right Dix-Hallpike None; states head does not feel quite right, no vertigo  N/A None observed  Left Head Roll None  N/A None observed  Right Head Roll None  N/A None observed     FUNCTIONAL OUTCOME MEASURES:  Results Comments  DHI  20/100  low perception of handicap  ABC Scale --  deferred-patient to bring in form at next session  DGI  23/24  safe for community mobility  FOTO  83/100 Given the patient's risk adjustment variables, like-patients nationally had a FS score of 58/100 at intake    VOR X 1 exercise:  Demonstrated and educated as to VOR X1.   Patient performed VOR X 1 horizontal in sitting 1 repetition of 30 seconds and 2 reps of 1 minute each with verbal cues for technique.  Patient reports the target is staying in focus and denies dizziness.   Issued for home exercise program.  Orthostatic Strategies: Patient reports episodes of dizziness and light-headedness when moving quickly during transitional movements such as supine to sit to stand. Discussed and demonstrated strategies to try to help minimize dizziness from position changes including maintaining adequate hydration, moving more slowly when transitioning and before standing performing sitting marches or long arc quads and upon standing performing side to side sways or marching in place. Patient repeated back movement demonstrations.     PT Education - 06/29/20 2056    Education Details Discussed plan of care; issued VOR times one 1 minute reps with plain background for home exercise program    Person(s) Educated Patient    Methods Explanation;Demonstration;Verbal cues;Handout    Comprehension Returned demonstration            PT Short Term Goals - 06/29/20 2057      PT SHORT TERM GOAL #1   Title Pt will be independent with HEP in order to decrease dizziness and improve function at home.    Time 4    Period Weeks    Status New    Target Date 07/27/20             PT Long Term Goals - 06/29/20 2057      PT LONG TERM GOAL #1  Title Patient will report 50% or greater improvement in her symptoms of dizziness and imbalance with provoking motions or positions    Baseline Reports 6-7/10 dizziness on 06/29/2020 with moving from supine to sitting;    Time 8    Period Weeks    Status New    Target Date 08/24/20      PT LONG TERM GOAL #2   Title Pt will report no further episodes episodes of feeling "swimmy-headed" when getting out of bed in the morning when using orthostatic hypotension strategies.    Baseline 09/27/19: daily symptoms; 06/29/2020 daily symptoms     Time 8    Period Weeks    Status New    Target Date 08/24/20                  Plan - 06/29/20 1504    Clinical Impression Statement Patient is a pleasant 86 year old female with a history of intermittent episodes of dizziness for approximately 5 years.  Patient with potential indicators of central signs as evidenced by abnormal gaze evoked nystagmus, smooth pursuit and saccades.  Patient with negative canal testing left and right sides this date.  Patient noted to have in supine blood pressure of 145/56 and upon sitting up patient reported 6-7/10 dizziness and her blood pressure increased to 161/67 mmHg.  Discussed orthostatic hypotension strategies.  Patient scored 83/100 on FOTO indicating good functional status with daily activities.  Patient scored 20/100 indicating low perception of handicap on the dizziness handicap inventory.  Patient scored 23/24 on the dynamic gait index indicating safe for community mobility.  Patient presents with the listed functional deficits and would benefit from skilled PT services to try to address goals and to decrease patient's subjective symptoms of dizziness.    Personal Factors and Comorbidities Comorbidity 3+;Time since onset of injury/illness/exacerbation    Comorbidities HTN, OA, hypothyroidism, giant cell arteritis, COPD    Examination-Activity Limitations Stand;Other   getting out of bed when first getting up in am   Stability/Clinical Decision Making Evolving/Moderate complexity    Clinical Decision Making Moderate    Rehab Potential Good    PT Frequency 1x / week    PT Duration 8 weeks    PT Treatment/Interventions ADLs/Self Care Home Management;Canalith Repostioning;Gait training;Stair training;Therapeutic exercise;Balance training;Neuromuscular re-education;Patient/family education;Therapeutic activities;Vestibular    Consulted and Agree with Plan of Care Patient           Patient will benefit from skilled therapeutic intervention in  order to improve the following deficits and impairments:  Decreased balance, Dizziness  Visit Diagnosis: Dizziness and giddiness     Problem List Patient Active Problem List   Diagnosis Date Noted  . SBO (small bowel obstruction) ([place]) 04/18/2019  . Status post total hip replacement, right 03/26/2019  . Temporal arteritis ([place]) 09/03/2018  . Leukocytosis 08/12/2018  . Pneumonia 12/04/2016   Lady Deutscher PT, DPT [phone] Lady Deutscher 06/30/2020, 5:48 PM  [place] MAIN [facility] SERVICES [address] [place], [place], [address] Phone: [phone]   Fax:  [phone]  Name: MARISHKA RENTFROW MRN: [id number] Date of Birth: March 23, 1930

## 2020-07-07 ENCOUNTER — Other Ambulatory Visit: Payer: Self-pay

## 2020-07-07 ENCOUNTER — Ambulatory Visit: Payer: PPO | Attending: Otolaryngology

## 2020-07-07 DIAGNOSIS — R42 Dizziness and giddiness: Secondary | ICD-10-CM | POA: Insufficient documentation

## 2020-07-07 NOTE — Therapy (Signed)
[place] MAIN [facility] SERVICES [address] [place], [place], [address] Phone: [phone]   Fax:  [phone]  Physical Therapy Treatment  Patient Details  Name: Mary Barker MRN: [id number] Date of Birth: 14-Jun-1930 Referring Provider (PT): Dr. Richardson Landry   Encounter Date: 07/07/2020   PT End of Session - 07/07/20 1106    Visit Number 2    Number of Visits 9    Date for PT Re-Evaluation 08/24/20    Authorization Type Eval 06/29/2020    PT Start Time 1017    PT Stop Time 1100    PT Time Calculation (min) 43 min    Equipment Utilized During Treatment Gait belt    Activity Tolerance Patient tolerated treatment well    Behavior During Therapy [facility] for tasks assessed/performed           Past Medical History:  Diagnosis Date  . Basal cell carcinoma 10/06/2015   Left nasal tip   . Carpal tunnel syndrome   . COPD (chronic obstructive pulmonary disease) ([place])   . Dyspnea   . Family history of adverse reaction to anesthesia    sister hard time waking up  . GCA (giant cell arteritis) ([place])   . H/O multiple pulmonary nodules   . Hypertension   . Hyponatremia   . Hypothyroidism   . Osteoarthritis   . Seasonal allergies   . Thyroid disease   . Tingling in extremities     Past Surgical History:  Procedure Laterality Date  . APPENDECTOMY    . ARTERY BIOPSY Left 07/31/2018   Procedure: BIOPSY TEMPORAL ARTERY;  Surgeon: Benjamine Sprague, DO;  Location: [facility] ORS;  Service: General;  Laterality: Left;  . BREAST EXCISIONAL BIOPSY Left 1954   neg  . BREAST SURGERY    . COLONOSCOPY    . DILATION AND CURETTAGE OF UTERUS    . EYE SURGERY Bilateral    cataract  . KNEE ARTHROSCOPY    . LAPAROTOMY N/A 04/20/2019   Procedure: EXPLORATORY LAPAROTOMY Small Bowel Obstruction;  Surgeon: Jules Husbands, MD;  Location: [facility] ORS;  Service: General;  Laterality: N/A;  . NASAL POLYP SURGERY    . TONSILLECTOMY    . TOTAL HIP ARTHROPLASTY Right 03/26/2019     Procedure: RIGHT TOTAL HIP ARTHROPLASTY;  Surgeon: Hessie Knows, MD;  Location: [facility] ORS;  Service: Orthopedics;  Laterality: Right;    There were no vitals filed for this visit.   Subjective Assessment - 07/07/20 1019    Subjective Pt reports that she continues with dizziness. She feels "swimmy-headed" and her dizziness seems dependent upon position.She reports difficulty thismorning when tipping her head back to put in her eye drops. Denies stumbles or falls since initial evaluation.    Pertinent History History obtained from patient report and medical record.  Patient was referred for vestibular therapy by Dr. Richardson Landry, ENT physician.  Patient has approximately 5-year history of intermittent episodes of dizziness and vertigo and a history of BPPV. Patient reports in January, she had vertigo and BPPV. In [place] she saw Dr. Richardson Landry and Brett Fairy PA and received treatment maneuvers for right BPPV.  Patient was referred to neurology.  Per medical record, patient had a positive VNG for peripheral hypofunction, but does not specify laterality. Patient states that she has intermittent dizziness symptoms. Patient reports she gets lightheadedness especially when she gets out of bed. Patient states she states she sits for 10 seconds or more and again when she first stands up. Patient  reports that the mornings are more challenging due to dizziness and imbalance. Patient reports she had 2 episodes of vertigo in the past 2 months and she had to grab hold for support when she was getting out of bed. Patient said she saw Eden Emms, PT and states she saw him for therapy and states she is not doing the exercises currently.  Patient describes Brant-Daroff exercises and states she does these occasionally and at times this brings on her dizziness symptoms. Patient reports changes in position and head position bring on her dizziness and states she gets vertigo if she tries to tip her head back when putting in eye  drops. Patient reports rolling over in bed sometimes brings on her symptoms, but states it is not drastic. Patient reports the more active she is during the day helps to decrease her dizziness. Patient reports walking helps her to feel better.  Patient reports she walks twice a day for three quarters of a mile each.  Patient states that her head just does not feel right.  Patient reports her symptoms of dizziness can last seconds or longer before she can function and states then her "function is not up to par".    Diagnostic tests VNG demonstrating peripheral hypofunction per medical record    Patient Stated Goals To have decreased dizziness and lightheadedness    Currently in Pain? No/denies                  TREATMENT   Neuromuscular Re-education  All balance exercises performed with CGA and no UE support unless otherwise documented  BPPV TESTS:  Symptoms Duration Intensity Nystagmus  Left Dix-Hallpike None   None  Right Dix-Hallpike Mild "dizziness" Less than 3s  3-5 upbeating R torsional beats  Left Head Roll None   None  Right Head Roll None   None    VOR X 1 exercise:  VOR X 1 horizontal in standing WBOS x 60s, pt denies dizziness; VOR X 1 horizontal in standing NBOS x 60s, pt denies dizziness;  VOR X 1 horizontal in standing Airex pad WBOS x 60s, pt denies dizziness;   Airex Pad NBOS eyes open/closed x 30s each; Alternating 6" step taps x 10 BLE; Rearfoot on Airex and front foot on 6" step semitandem balance alternating forward LE x 30s each; NBOS horizontal and vertical head turns x 30s each;  1/2 Foam Roll Static balance x 30s; Heel/toe rocking x 30s;   Canalith Repositioning Treatment Pt treated with 2 bouts of Epley Maneuver for presumed R posterior canal BPPV. 1 minute holds in each position (due to pt discomfort with prolonged positioning) and retesting between maneuvers as well as after final maneuver. After first maneuver pt reports very mild dizziness  and no nystagmus observed. After final maneuver Dix-Hallpike is negative for both vertigo and nystagmus.   Pt educated throughout session about proper posture and technique with exercises. Improved exercise technique, movement at target joints, use of target muscles after min to mod verbal, visual, tactile cues.    Pt demonstrates good motivation during session. She does report some continued dizziness when extending her head/neck to put in her eye drops. Dix-Hallpike performed and is positive for very brief dizziness with a few faint upbeating R torsional beats. Pt taken through 2 bouts of the Epley maneuver with complete resolution of nystagmus and dizziness. Proceeded with adaptation and habituation exercises. Pt denies any dizziness with VOR x 1 horizontal exercise in all conditions. Performed additional balance exercises on  Airex pad and 1/2 foam roll. Pt encouraged to follow-up as scheduled. Pt will benefit from PT services to address deficits in strength, balance, and mobility in order to return to full function at home.                PT Short Term Goals - 06/29/20 2057      PT SHORT TERM GOAL #1   Title Pt will be independent with HEP in order to decrease dizziness and improve function at home.    Time 4    Period Weeks    Status New    Target Date 07/27/20             PT Long Term Goals - 06/29/20 2057      PT LONG TERM GOAL #1   Title Patient will report 50% or greater improvement in her symptoms of dizziness and imbalance with provoking motions or positions    Baseline Reports 6-7/10 dizziness on 06/29/2020 with moving from supine to sitting;    Time 8    Period Weeks    Status New    Target Date 08/24/20      PT LONG TERM GOAL #2   Title Pt will report no further episodes episodes of feeling "swimmy-headed" when getting out of bed in the morning when using orthostatic hypotension strategies.    Baseline 09/27/19: daily symptoms; 06/29/2020 daily symptoms     Time 8    Period Weeks    Status New    Target Date 08/24/20                 Plan - 07/07/20 1106    Clinical Impression Statement Pt demonstrates good motivation during session. She does report some continued dizziness when extending her head/neck to put in her eye drops. Dix-Hallpike performed and is positive for very brief dizziness with a few faint upbeating R torsional beats. Pt taken through 2 bouts of the Epley maneuver with complete resolution of nystagmus and dizziness. Proceeded with adaptation and habituation exercises. Pt denies any dizziness with VOR x 1 horizontal exercise in all conditions. Performed additional balance exercises on Airex pad and 1/2 foam roll. Pt encouraged to follow-up as scheduled. Pt will benefit from PT services to address deficits in strength, balance, and mobility in order to return to full function at home.    Personal Factors and Comorbidities Comorbidity 3+;Time since onset of injury/illness/exacerbation    Comorbidities HTN, OA, hypothyroidism, giant cell arteritis, COPD    Examination-Activity Limitations Stand;Other   getting out of bed when first getting up in am   Stability/Clinical Decision Making Evolving/Moderate complexity    Rehab Potential Good    PT Frequency 1x / week    PT Duration 8 weeks    PT Treatment/Interventions ADLs/Self Care Home Management;Canalith Repostioning;Gait training;Stair training;Therapeutic exercise;Balance training;Neuromuscular re-education;Patient/family education;Therapeutic activities;Vestibular    PT Next Visit Plan Continue with adaptation and habituation exercises    Consulted and Agree with Plan of Care Patient           Patient will benefit from skilled therapeutic intervention in order to improve the following deficits and impairments:  Decreased balance, Dizziness  Visit Diagnosis: Dizziness and giddiness     Problem List Patient Active Problem List   Diagnosis Date Noted  . SBO (small  bowel obstruction) ([place]) 04/18/2019  . Status post total hip replacement, right 03/26/2019  . Temporal arteritis ([place]) 09/03/2018  . Leukocytosis 08/12/2018  . Pneumonia 12/04/2016   Lyndel Safe  Shaquille Janes PT, DPT, GCS  Eliza Grissinger 07/07/2020, 12:50 PM  [place] MAIN [facility] SERVICES [address] [place], [place], [address] Phone: [phone]   Fax:  [phone]  Name: KETRA DUCHESNE MRN: [id number] Date of Birth: [birth year]

## 2020-07-13 ENCOUNTER — Other Ambulatory Visit: Payer: Self-pay

## 2020-07-13 ENCOUNTER — Ambulatory Visit: Payer: PPO

## 2020-07-13 DIAGNOSIS — R42 Dizziness and giddiness: Secondary | ICD-10-CM

## 2020-07-13 NOTE — Therapy (Signed)
[place] MAIN [facility] SERVICES [address] [place], [place], [address] Phone: [phone]   Fax:  [phone]  Physical Therapy Treatment  Patient Details  Name: Mary Barker MRN: [id number] Date of Birth: [birth year] Referring Provider (PT): Dr. Richardson Landry   Encounter Date: 07/13/2020   PT End of Session - 07/14/20 1130    Visit Number 3    Number of Visits 9    Date for PT Re-Evaluation 08/24/20    Authorization Type Eval 06/29/2020    PT Start Time 1430    PT Stop Time 1515    PT Time Calculation (min) 45 min    Equipment Utilized During Treatment Gait belt    Activity Tolerance Patient tolerated treatment well    Behavior During Therapy [facility] for tasks assessed/performed           Past Medical History:  Diagnosis Date  . Basal cell carcinoma 10/06/2015   Left nasal tip   . Carpal tunnel syndrome   . COPD (chronic obstructive pulmonary disease) ([place])   . Dyspnea   . Family history of adverse reaction to anesthesia    sister hard time waking up  . GCA (giant cell arteritis) ([place])   . H/O multiple pulmonary nodules   . Hypertension   . Hyponatremia   . Hypothyroidism   . Osteoarthritis   . Seasonal allergies   . Thyroid disease   . Tingling in extremities     Past Surgical History:  Procedure Laterality Date  . APPENDECTOMY    . ARTERY BIOPSY Left 07/31/2018   Procedure: BIOPSY TEMPORAL ARTERY;  Surgeon: Benjamine Sprague, DO;  Location: [facility] ORS;  Service: General;  Laterality: Left;  . BREAST EXCISIONAL BIOPSY Left 1954   neg  . BREAST SURGERY    . COLONOSCOPY    . DILATION AND CURETTAGE OF UTERUS    . EYE SURGERY Bilateral    cataract  . KNEE ARTHROSCOPY    . LAPAROTOMY N/A 04/20/2019   Procedure: EXPLORATORY LAPAROTOMY Small Bowel Obstruction;  Surgeon: Jules Husbands, MD;  Location: [facility] ORS;  Service: General;  Laterality: N/A;  . NASAL POLYP SURGERY    . TONSILLECTOMY    . TOTAL HIP ARTHROPLASTY Right 03/26/2019     Procedure: RIGHT TOTAL HIP ARTHROPLASTY;  Surgeon: Hessie Knows, MD;  Location: [facility] ORS;  Service: Orthopedics;  Laterality: Right;    There were no vitals filed for this visit.   Subjective Assessment - 07/13/20 1432    Subjective Pt reports that she is doing alright. She states that the day after her last therapy session she felt like her head was more "clear" than it had been in a long time. Howver the following day she looked upward into a shelf and the dizziness returned. She had one additional significant episode when tipping her head back. No specific questions upon arrival.    Pertinent History History obtained from patient report and medical record.  Patient was referred for vestibular therapy by Dr. Richardson Landry, ENT physician.  Patient has approximately 5-year history of intermittent episodes of dizziness and vertigo and a history of BPPV. Patient reports in January, she had vertigo and BPPV. In [place] she saw Dr. Richardson Landry and Brett Fairy PA and received treatment maneuvers for right BPPV.  Patient was referred to neurology.  Per medical record, patient had a positive VNG for peripheral hypofunction, but does not specify laterality. Patient states that she has intermittent dizziness symptoms. Patient reports she gets lightheadedness especially  when she gets out of bed. Patient states she states she sits for 10 seconds or more and again when she first stands up. Patient reports that the mornings are more challenging due to dizziness and imbalance. Patient reports she had 2 episodes of vertigo in the past 2 months and she had to grab hold for support when she was getting out of bed. Patient said she saw Eden Emms, PT and states she saw him for therapy and states she is not doing the exercises currently.  Patient describes Brant-Daroff exercises and states she does these occasionally and at times this brings on her dizziness symptoms. Patient reports changes in position and head position bring on  her dizziness and states she gets vertigo if she tries to tip her head back when putting in eye drops. Patient reports rolling over in bed sometimes brings on her symptoms, but states it is not drastic. Patient reports the more active she is during the day helps to decrease her dizziness. Patient reports walking helps her to feel better.  Patient reports she walks twice a day for three quarters of a mile each.  Patient states that her head just does not feel right.  Patient reports her symptoms of dizziness can last seconds or longer before she can function and states then her "function is not up to par".    Diagnostic tests VNG demonstrating peripheral hypofunction per medical record    Patient Stated Goals To have decreased dizziness and lightheadedness    Currently in Pain? No/denies            TREATMENT   Canalith Repositioning Treatment Performed Dix-Hallpike testing which is positive on the right side for upbeating R torsional nystamgus and negative on the L side for either nystagmus or vertigo. Completed 1 bout of the Epley maneuver for the R side with 1 minute holds in each positioning. Retesting with Dix-Hallpike is now positive on the L side for upbeating L torsional nystagmus. On the R side pt presents with downbeating R torsional nystagmus. Roll test negative bilaterally. Pt treated with 2 additional bouts of the Epley maneuver but this time for the L side with 1 minute holds in each position. Retesting between maneuvers demonstrates improvement in symptoms and nystagmus but still present. Will repeat testing and treatment at next visit as necessary.                                  PT Short Term Goals - 06/29/20 2057      PT SHORT TERM GOAL #1   Title Pt will be independent with HEP in order to decrease dizziness and improve function at home.    Time 4    Period Weeks    Status New    Target Date 07/27/20             PT Long Term Goals -  06/29/20 2057      PT LONG TERM GOAL #1   Title Patient will report 50% or greater improvement in her symptoms of dizziness and imbalance with provoking motions or positions    Baseline Reports 6-7/10 dizziness on 06/29/2020 with moving from supine to sitting;    Time 8    Period Weeks    Status New    Target Date 08/24/20      PT LONG TERM GOAL #2   Title Pt will report no further episodes episodes of feeling "swimmy-headed"  when getting out of bed in the morning when using orthostatic hypotension strategies.    Baseline 09/27/19: daily symptoms; 06/29/2020 daily symptoms    Time 8    Period Weeks    Status New    Target Date 08/24/20                 Plan - 07/13/20 1434    Clinical Impression Statement Performed Dix-Hallpike testing which is positive on the right side for upbeating R torsional nystamgus and negative on the L side for either nystagmus or vertigo. Completed 1 bout of the Epley maneuver for the R side with 1 minute holds in each positioning. Retesting with Dix-Hallpike is now positive on the L side for upbeating L torsional nystagmus. On the R side pt presents with downbeating R torsional nystagmus. Roll test negative bilaterally. Pt treated with 2 additional bouts of the Epley maneuver but this time for the L side with 1 minute holds in each position. Retesting between maneuvers demonstrates improvement in symptoms and nystagmus but still present. Will repeat testing and treatment at next visit as necessary.    Personal Factors and Comorbidities Comorbidity 3+;Time since onset of injury/illness/exacerbation    Comorbidities HTN, OA, hypothyroidism, giant cell arteritis, COPD    Examination-Activity Limitations Stand;Other   getting out of bed when first getting up in am   Stability/Clinical Decision Making Evolving/Moderate complexity    Rehab Potential Good    PT Frequency 1x / week    PT Duration 8 weeks    PT Treatment/Interventions ADLs/Self Care Home  Management;Canalith Repostioning;Gait training;Stair training;Therapeutic exercise;Balance training;Neuromuscular re-education;Patient/family education;Therapeutic activities;Vestibular    PT Next Visit Plan Retesting and treatment for BPPV, Continue with adaptation and habituation exercises    Consulted and Agree with Plan of Care Patient           Patient will benefit from skilled therapeutic intervention in order to improve the following deficits and impairments:  Decreased balance, Dizziness  Visit Diagnosis: Dizziness and giddiness     Problem List Patient Active Problem List   Diagnosis Date Noted  . SBO (small bowel obstruction) ([place]) 04/18/2019  . Status post total hip replacement, right 03/26/2019  . Temporal arteritis ([place]) 09/03/2018  . Leukocytosis 08/12/2018  . Pneumonia 12/04/2016   Phillips Grout PT, DPT, GCS  Mary Barker 07/14/2020, 11:39 AM  [place] MAIN [facility] SERVICES [address] [place], [place], [address] Phone: [phone]   Fax:  [phone]  Name: Mary Barker MRN: [id number] Date of Birth: Jun 18, 1930

## 2020-07-20 ENCOUNTER — Other Ambulatory Visit: Payer: Self-pay

## 2020-07-20 ENCOUNTER — Ambulatory Visit: Payer: PPO | Admitting: Physical Therapy

## 2020-07-20 ENCOUNTER — Encounter: Payer: Self-pay | Admitting: Physical Therapy

## 2020-07-20 DIAGNOSIS — R42 Dizziness and giddiness: Secondary | ICD-10-CM | POA: Diagnosis not present

## 2020-07-20 NOTE — Therapy (Signed)
[place] MAIN [facility] SERVICES [address] [place], [place], [address] Phone: [phone]   Fax:  [phone]  Physical Therapy Treatment  Patient Details  Name: Mary Barker MRN: [id number] Date of Birth: 1930-04-19 Referring Provider (PT): Dr. Richardson Landry   Encounter Date: 07/20/2020   PT End of Session - 07/20/20 1413    Visit Number 4    Number of Visits 9    Date for PT Re-Evaluation 08/24/20    Authorization Type Eval 06/29/2020    PT Start Time 1413    PT Stop Time 1458    PT Time Calculation (min) 45 min    Equipment Utilized During Treatment Gait belt    Activity Tolerance Patient tolerated treatment well    Behavior During Therapy [facility] for tasks assessed/performed           Past Medical History:  Diagnosis Date  . Basal cell carcinoma 10/06/2015   Left nasal tip   . Carpal tunnel syndrome   . COPD (chronic obstructive pulmonary disease) ([place])   . Dyspnea   . Family history of adverse reaction to anesthesia    sister hard time waking up  . GCA (giant cell arteritis) ([place])   . H/O multiple pulmonary nodules   . Hypertension   . Hyponatremia   . Hypothyroidism   . Osteoarthritis   . Seasonal allergies   . Thyroid disease   . Tingling in extremities     Past Surgical History:  Procedure Laterality Date  . APPENDECTOMY    . ARTERY BIOPSY Left 07/31/2018   Procedure: BIOPSY TEMPORAL ARTERY;  Surgeon: Benjamine Sprague, DO;  Location: [facility] ORS;  Service: General;  Laterality: Left;  . BREAST EXCISIONAL BIOPSY Left 1954   neg  . BREAST SURGERY    . COLONOSCOPY    . DILATION AND CURETTAGE OF UTERUS    . EYE SURGERY Bilateral    cataract  . KNEE ARTHROSCOPY    . LAPAROTOMY N/A 04/20/2019   Procedure: EXPLORATORY LAPAROTOMY Small Bowel Obstruction;  Surgeon: Jules Husbands, MD;  Location: [facility] ORS;  Service: General;  Laterality: N/A;  . NASAL POLYP SURGERY    . TONSILLECTOMY    . TOTAL HIP ARTHROPLASTY Right 03/26/2019     Procedure: RIGHT TOTAL HIP ARTHROPLASTY;  Surgeon: Hessie Knows, MD;  Location: [facility] ORS;  Service: Orthopedics;  Laterality: Right;    There were no vitals filed for this visit.   Subjective Assessment - 07/20/20 1413    Subjective Patient states that the last time did not help and states she has been having episodes of vertigo in the morning.    Pertinent History History obtained from patient report and medical record.  Patient was referred for vestibular therapy by Dr. Richardson Landry, ENT physician.  Patient has approximately 5-year history of intermittent episodes of dizziness and vertigo and a history of BPPV. Patient reports in January, she had vertigo and BPPV. In [place] she saw Dr. Richardson Landry and Brett Fairy PA and received treatment maneuvers for right BPPV.  Patient was referred to neurology.  Per medical record, patient had a positive VNG for peripheral hypofunction, but does not specify laterality. Patient states that she has intermittent dizziness symptoms. Patient reports she gets lightheadedness especially when she gets out of bed. Patient states she states she sits for 10 seconds or more and again when she first stands up. Patient reports that the mornings are more challenging due to dizziness and imbalance. Patient reports she had 2  episodes of vertigo in the past 2 months and she had to grab hold for support when she was getting out of bed. Patient said she saw Eden Emms, PT and states she saw him for therapy and states she is not doing the exercises currently.  Patient describes Brant-Daroff exercises and states she does these occasionally and at times this brings on her dizziness symptoms. Patient reports changes in position and head position bring on her dizziness and states she gets vertigo if she tries to tip her head back when putting in eye drops. Patient reports rolling over in bed sometimes brings on her symptoms, but states it is not drastic. Patient reports the more active she is  during the day helps to decrease her dizziness. Patient reports walking helps her to feel better.  Patient reports she walks twice a day for three quarters of a mile each.  Patient states that her head just does not feel right.  Patient reports her symptoms of dizziness can last seconds or longer before she can function and states then her "function is not up to par".    Diagnostic tests VNG demonstrating peripheral hypofunction per medical record    Patient Stated Goals To have decreased dizziness and lightheadedness           Canalith Repositioning Maneuver: On inverted mat table, performed left Dix-Hallpike testing and it was negative with no nystagmus observed and patient denied vertigo.  On inverted mat table, performed right Dix-Hallpike test and it was potentially positive with no nystagmus observed, but patient reports mild dizziness lasting only a few seconds. Performed one right Epley maneuver.  Performed follow-up right Dix-Hallpike test and patient denied dizziness and no vertigo was observed.  Neuromuscular Re-education:  VOR X 1 exercise:  Patient performed VOR X 1 in standing 1 rep of horizontal and 1 rep of vertical of 1 minute each. Patient denies dizziness with this activity.  Airex pad:  On firm surface and then on Airex pad, patient performed feet together and semi-tandem progressions with alternating lead leg with and without body turns and horizontal and vertical head turns with contact-guard assistance.  Patient reports no dizziness with this activity.  Airex balance beam: On Airex balance beam, performed sideways stepping with horizontal head turns 5' times multiple reps. On Airex balance beam, performed sideways stepping with vertical head turns 5' times multiple reps. On Airex balance beam, performed tandem stance static holds with horizontal and vertical head turns. Patient required contact-guard assistance with all above activities and patient briefly touched  parallel bar with 1 hand to regain balance multiple times.  Body Wall Rolls:  Patient performed 5 reps of supported, body wall rolls with eyes open with CGA.  Patient reports dizziness with this activity.     Note: This note was written using dragon dictation.   PT Education - 07/20/20 1413    Education Details reviewed VOR X 1 in standing; discussed plan of care    Person(s) Educated Patient    Methods Explanation    Comprehension Verbalized understanding            PT Short Term Goals - 06/29/20 2057      PT SHORT TERM GOAL #1   Title Pt will be independent with HEP in order to decrease dizziness and improve function at home.    Time 4    Period Weeks    Status New    Target Date 07/27/20  PT Long Term Goals - 06/29/20 2057      PT LONG TERM GOAL #1   Title Patient will report 50% or greater improvement in her symptoms of dizziness and imbalance with provoking motions or positions    Baseline Reports 6-7/10 dizziness on 06/29/2020 with moving from supine to sitting;    Time 8    Period Weeks    Status New    Target Date 08/24/20      PT LONG TERM GOAL #2   Title Pt will report no further episodes episodes of feeling "swimmy-headed" when getting out of bed in the morning when using orthostatic hypotension strategies.    Baseline 09/27/19: daily symptoms; 06/29/2020 daily symptoms    Time 8    Period Weeks    Status New    Target Date 08/24/20                 Plan - 07/20/20 1801    Clinical Impression Statement Patient returns to clinic with reports that she has continued to have vertigo with turning over in bed.  Patient with negative left Dix-Hallpike test this date and potentially positive right Dix-Hallpike test with no nystagmus observed, but patient reports mild dizziness lasting a few seconds.  Performed 1 right Epley maneuver and upon follow-up Dix-Hallpike test patient denied dizziness and no vertigo was observed.  Patient denied  dizziness during Airex pad, Airex balance beam and body wall roll activities, but patient's balance was challenged by activities on uneven surfaces.  Next session will plan on repeating Dix-Hallpike test and performing repositioning maneuver if indicated.  Patient would benefit from continued vestibular PT services to further address balance deficits and goals as set on plan of care.    Personal Factors and Comorbidities Comorbidity 3+;Time since onset of injury/illness/exacerbation    Comorbidities HTN, OA, hypothyroidism, giant cell arteritis, COPD    Examination-Activity Limitations Stand;Other   getting out of bed when first getting up in am   Stability/Clinical Decision Making Evolving/Moderate complexity    Rehab Potential Good    PT Frequency 1x / week    PT Duration 8 weeks    PT Treatment/Interventions ADLs/Self Care Home Management;Canalith Repostioning;Gait training;Stair training;Therapeutic exercise;Balance training;Neuromuscular re-education;Patient/family education;Therapeutic activities;Vestibular    PT Next Visit Plan Retesting and treatment for BPPV, Continue with adaptation and habituation exercises    Consulted and Agree with Plan of Care Patient           Patient will benefit from skilled therapeutic intervention in order to improve the following deficits and impairments:  Decreased balance, Dizziness  Visit Diagnosis: Dizziness and giddiness     Problem List Patient Active Problem List   Diagnosis Date Noted  . SBO (small bowel obstruction) ([place]) 04/18/2019  . Status post total hip replacement, right 03/26/2019  . Temporal arteritis ([place]) 09/03/2018  . Leukocytosis 08/12/2018  . Pneumonia 12/04/2016   Lady Deutscher PT, DPT [phone] Lady Deutscher 07/20/2020, 6:05 PM  [place] MAIN [facility] [address] [place], [place], [address] Phone: [phone]   Fax:  [phone]  Name: Mary Barker MRN:  [id number] Date of Birth: 25-Jun-1930

## 2020-07-27 ENCOUNTER — Encounter: Payer: Self-pay | Admitting: Physical Therapy

## 2020-07-27 ENCOUNTER — Ambulatory Visit: Payer: PPO | Admitting: Physical Therapy

## 2020-07-27 ENCOUNTER — Other Ambulatory Visit: Payer: Self-pay

## 2020-07-27 DIAGNOSIS — R42 Dizziness and giddiness: Secondary | ICD-10-CM

## 2020-07-27 NOTE — Therapy (Signed)
[place] MAIN [facility] SERVICES [address] [place], [place], [address] Phone: [phone]   Fax:  [phone]  Physical Therapy Treatment  Patient Details  Name: Mary Barker MRN: [id number] Date of Birth: March 01, 1930 Referring Provider (PT): Dr. Richardson Landry   Encounter Date: 07/27/2020   PT End of Session - 07/27/20 1455    Visit Number 5    Number of Visits 9    Date for PT Re-Evaluation 08/24/20    Authorization Type Eval 06/29/2020    PT Start Time 1450    PT Stop Time 1534    PT Time Calculation (min) 44 min    Equipment Utilized During Treatment Gait belt    Activity Tolerance Patient tolerated treatment well    Behavior During Therapy [facility] for tasks assessed/performed           Past Medical History:  Diagnosis Date  . Basal cell carcinoma 10/06/2015   Left nasal tip   . Carpal tunnel syndrome   . COPD (chronic obstructive pulmonary disease) ([place])   . Dyspnea   . Family history of adverse reaction to anesthesia    sister hard time waking up  . GCA (giant cell arteritis) ([place])   . H/O multiple pulmonary nodules   . Hypertension   . Hyponatremia   . Hypothyroidism   . Osteoarthritis   . Seasonal allergies   . Thyroid disease   . Tingling in extremities     Past Surgical History:  Procedure Laterality Date  . APPENDECTOMY    . ARTERY BIOPSY Left 07/31/2018   Procedure: BIOPSY TEMPORAL ARTERY;  Surgeon: Benjamine Sprague, DO;  Location: [facility] ORS;  Service: General;  Laterality: Left;  . BREAST EXCISIONAL BIOPSY Left 1954   neg  . BREAST SURGERY    . COLONOSCOPY    . DILATION AND CURETTAGE OF UTERUS    . EYE SURGERY Bilateral    cataract  . KNEE ARTHROSCOPY    . LAPAROTOMY N/A 04/20/2019   Procedure: EXPLORATORY LAPAROTOMY Small Bowel Obstruction;  Surgeon: Jules Husbands, MD;  Location: [facility] ORS;  Service: General;  Laterality: N/A;  . NASAL POLYP SURGERY    . TONSILLECTOMY    . TOTAL HIP ARTHROPLASTY Right 03/26/2019     Procedure: RIGHT TOTAL HIP ARTHROPLASTY;  Surgeon: Hessie Knows, MD;  Location: [facility] ORS;  Service: Orthopedics;  Laterality: Right;    There were no vitals filed for this visit.   Subjective Assessment - 07/27/20 1846    Subjective Patient states that she has had no further episodes of vertigo but that she continues to have dizziness and lightheadedness episodes daily.  Patient reports that her symptoms are worse in the mornings and that as the day progresses she does better.  Patient reports that she has experienced some lightheadedness with positional changes at home recently. Patient reports that she tried doing the eye drops in the bed and it caused her to get dizziness. Patient reports when she was keeping score while playing a game she kept getting episodes of dizziness this past week. Patient reports she has gotten lightheaded several times after sitting and reading and going to get up quickly.    Pertinent History History obtained from patient report and medical record.  Patient was referred for vestibular therapy by Dr. Richardson Landry, ENT physician.  Patient has approximately 5-year history of intermittent episodes of dizziness and vertigo and a history of BPPV. Patient reports in January, she had vertigo and BPPV. In [place] she  saw Dr. Richardson Landry and Brett Fairy PA and received treatment maneuvers for right BPPV.  Patient was referred to neurology.  Per medical record, patient had a positive VNG for peripheral hypofunction, but does not specify laterality. Patient states that she has intermittent dizziness symptoms. Patient reports she gets lightheadedness especially when she gets out of bed. Patient states she states she sits for 10 seconds or more and again when she first stands up. Patient reports that the mornings are more challenging due to dizziness and imbalance. Patient reports she had 2 episodes of vertigo in the past 2 months and she had to grab hold for support when she was getting out of  bed. Patient said she saw Eden Emms, PT and states she saw him for therapy and states she is not doing the exercises currently.  Patient describes Brant-Daroff exercises and states she does these occasionally and at times this brings on her dizziness symptoms. Patient reports changes in position and head position bring on her dizziness and states she gets vertigo if she tries to tip her head back when putting in eye drops. Patient reports rolling over in bed sometimes brings on her symptoms, but states it is not drastic. Patient reports the more active she is during the day helps to decrease her dizziness. Patient reports walking helps her to feel better.  Patient reports she walks twice a day for three quarters of a mile each.  Patient states that her head just does not feel right.  Patient reports her symptoms of dizziness can last seconds or longer before she can function and states then her "function is not up to par".    Diagnostic tests VNG demonstrating peripheral hypofunction per medical record    Patient Stated Goals To have decreased dizziness and lightheadedness           Canalith Repositioning Maneuver: On inverted mat table performed right Dix-Hallpike testing no nystagmus was observed and patient denied vertigo, but reports her head did not feel quite right and lightheadedness.  Performed 1 Epley maneuver to help ensure full clearance of particles. On inverted mat table, performed left Dix-Hallpike test and patient denied vertigo and no nystagmus was observed.  Neuromuscular Re-education:  Orthostatic Strategies: During session, patient reported getting mild lightheadedness/dizziness when changing positions on the mat table from supine to sitting to standing in the clinic. Patient reports episodes of dizziness and light-headedness when moving quickly during transitional movements such as sit to stand. Discussed and demonstrated strategies to try to help minimize dizziness from position  changes including maintaining adequate hydration, moving more slowly when transitioning and before standing performing sitting marches and upon standing performing marching in place. Patient verbalized understanding.  Airex pad:  On Airex pad, patient performed feet together progressions and was able to progress to feet together and semi-tandem progressions with alternating lead leg with and without body turns and horizontal and vertical head turns with contact-guard assist multiple 60 second repetitions.  Patient performed these exercises standing in a corner with chair in front for safety.  Added these activities to home exercise program. Patient reports unsteadiness and dizziness but states she has been dizzy since performing the canal testing in the Epley maneuver this date. Discussed safety precautions with performing home exercise program at home. Demonstrated and discussed standing in corner with chair in front for safety and then patient demonstrated.  Ambulation with head turns:  Patient performed 125' trials of forwards and retro ambulation with horizontal and vertical head turns with CGA.  Patient demonstrates no veering or uneven steppage.  Patient reports no increase in her dizziness symptoms.  Patient reports that she has been performing VOR x1 exercise, but states this does not reproduce her dizziness.  Therefore did not perform this date and will have patient pause from doing VOR x1 this week for home exercise program and instead focus on feet together and semitandem progressions with horizontal and vertical head turns and body turns standing on a pillow standing in the corner for safety at home.  Note: This note was written using dragon dictation.    PT Education - 07/27/20 1847    Education Details Added feet together and semitandem progressions with horizontal and vertical head turns and body turns standing on a pillow while standing in the corner for safety for home exercise  program.    Person(s) Educated Patient    Methods Explanation;Demonstration;Verbal cues;Handout    Comprehension Verbalized understanding;Returned demonstration            PT Short Term Goals - 06/29/20 2057      PT SHORT TERM GOAL #1   Title Pt will be independent with HEP in order to decrease dizziness and improve function at home.    Time 4    Period Weeks    Status New    Target Date 07/27/20             PT Long Term Goals - 06/29/20 2057      PT LONG TERM GOAL #1   Title Patient will report 50% or greater improvement in her symptoms of dizziness and imbalance with provoking motions or positions    Baseline Reports 6-7/10 dizziness on 06/29/2020 with moving from supine to sitting;    Time 8    Period Weeks    Status New    Target Date 08/24/20      PT LONG TERM GOAL #2   Title Pt will report no further episodes episodes of feeling "swimmy-headed" when getting out of bed in the morning when using orthostatic hypotension strategies.    Baseline 09/27/19: daily symptoms; 06/29/2020 daily symptoms    Time 8    Period Weeks    Status New    Target Date 08/24/20                 Plan - 07/27/20 1901    Clinical Impression Statement Patient reports no further episodes of vertigo but states she has been getting daily episodes of dizziness and lightheadedness.  Patient's balance was challenged by uneven surface with feet together and semitandem progressions with head turns and body turns.  Added these activities to home exercise program with patient standing in a corner with chair in front for safety and reviewed this in clinic this date.  We will plan on reviewing home exercise program next session.  Patient denied any change to her dizziness symptoms with ambulation with head turns.  Will try ambulation with ball toss over shoulder and ball toss to self next session.  Patient would benefit from continued skilled vestibular services to further address functional deficits,  goals and to try to further reduce patient's subjective symptoms of dizziness and imbalance.    Personal Factors and Comorbidities Comorbidity 3+;Time since onset of injury/illness/exacerbation    Comorbidities HTN, OA, hypothyroidism, giant cell arteritis, COPD    Examination-Activity Limitations Stand;Other   getting out of bed when first getting up in am   Stability/Clinical Decision Making Evolving/Moderate complexity    Rehab Potential Good  PT Frequency 1x / week    PT Duration 8 weeks    PT Treatment/Interventions ADLs/Self Care Home Management;Canalith Repostioning;Gait training;Stair training;Therapeutic exercise;Balance training;Neuromuscular re-education;Patient/family education;Therapeutic activities;Vestibular    PT Next Visit Plan Retesting and treatment for BPPV, Continue with adaptation and habituation exercises    Consulted and Agree with Plan of Care Patient           Patient will benefit from skilled therapeutic intervention in order to improve the following deficits and impairments:  Decreased balance, Dizziness  Visit Diagnosis: Dizziness and giddiness     Problem List Patient Active Problem List   Diagnosis Date Noted  . SBO (small bowel obstruction) ([place]) 04/18/2019  . Status post total hip replacement, right 03/26/2019  . Temporal arteritis ([place]) 09/03/2018  . Leukocytosis 08/12/2018  . Pneumonia 12/04/2016   Lady Deutscher PT, DPT [phone] Lady Deutscher 07/27/2020, 7:04 PM  [place] MAIN [facility] [address] [place], [place], [address] Phone: [phone]   Fax:  [phone]  Name: KAMERON GLAZEBROOK MRN: [id number] Date of Birth: 05-29-1930

## 2020-07-28 ENCOUNTER — Other Ambulatory Visit: Payer: Self-pay

## 2020-07-28 ENCOUNTER — Ambulatory Visit: Payer: PPO | Admitting: Dermatology

## 2020-07-28 ENCOUNTER — Encounter: Payer: Self-pay | Admitting: Dermatology

## 2020-07-28 DIAGNOSIS — Z85828 Personal history of other malignant neoplasm of skin: Secondary | ICD-10-CM | POA: Diagnosis not present

## 2020-07-28 DIAGNOSIS — L820 Inflamed seborrheic keratosis: Secondary | ICD-10-CM | POA: Diagnosis not present

## 2020-07-28 DIAGNOSIS — L578 Other skin changes due to chronic exposure to nonionizing radiation: Secondary | ICD-10-CM

## 2020-07-28 NOTE — Progress Notes (Signed)
   Follow-Up Visit   Subjective  Mary Barker is a 86 y.o. female who presents for the following: Follow-up (ISKs, recheck left vertex scalp).  She has h/o BCC.  The following portions of the chart were reviewed this encounter and updated as appropriate:      Review of Systems:  No other skin or systemic complaints except as noted in HPI or Assessment and Plan.  Objective  Well appearing patient in no apparent distress; mood and affect are within normal limits.  A focused examination was performed including face, scalp. Relevant physical exam findings are noted in the Assessment and Plan.  Objective  L vertex scalp x 1: Small residual erythematous keratotic or waxy stuck-on papule. No pigment or discoloration   Right Lateral Ankle: 1.2cm pink scaly patch   Assessment & Plan    History of Basal Cell Carcinoma of the Skin - No evidence of recurrence today of the right posterior scalp and basosquamous of the left nasal tip - Recommend regular full body skin exams - Recommend daily broad spectrum sunscreen SPF 30+ to sun-exposed areas, reapply every 2 hours as needed.  - Call if any new or changing lesions are noted between office visits  Actinic Damage - chronic, secondary to cumulative UV radiation exposure/sun exposure over time - diffuse scaly erythematous macules with underlying dyspigmentation - Recommend daily broad spectrum sunscreen SPF 30+ to sun-exposed areas, reapply every 2 hours as needed.  - Call for new or changing lesions.  Inflamed seborrheic keratosis (2) Right Lateral Ankle; L vertex scalp x 1  Scalp improved residual treated today R ankle not treated due to location- rec. moisturizer  Destruction of lesion - L vertex scalp x 1  Destruction method: cryotherapy   Informed consent: discussed and consent obtained   Lesion destroyed using liquid nitrogen: Yes   Region frozen until ice ball extended beyond lesion: Yes   Outcome: patient tolerated  procedure well with no complications   Post-procedure details: wound care instructions given    Return in about 6 months (around 01/25/2021) for TBSE.   IJamesetta Orleans, CMA, am acting as scribe for Brendolyn Patty, MD .  Documentation: I have reviewed the above documentation for accuracy and completeness, and I agree with the above.  Brendolyn Patty MD

## 2020-07-28 NOTE — Patient Instructions (Signed)
Cryotherapy Aftercare  . Wash gently with soap and water everyday.   . Apply Vaseline and Band-Aid daily until healed.  

## 2020-08-04 ENCOUNTER — Encounter: Payer: Self-pay | Admitting: Physical Therapy

## 2020-08-04 ENCOUNTER — Other Ambulatory Visit: Payer: Self-pay

## 2020-08-04 ENCOUNTER — Ambulatory Visit: Payer: PPO | Admitting: Physical Therapy

## 2020-08-04 DIAGNOSIS — R42 Dizziness and giddiness: Secondary | ICD-10-CM

## 2020-08-04 NOTE — Therapy (Signed)
[place] MAIN [facility] SERVICES [address] [place], [place], [address] Phone: [phone]   Fax:  [phone]  Physical Therapy Treatment  Patient Details  Name: Mary Barker MRN: [id number] Date of Birth: [birth year] Referring Provider (PT): Dr. Richardson Landry   Encounter Date: 08/04/2020   PT End of Session - 08/04/20 1306    Visit Number 6    Number of Visits 9    Date for PT Re-Evaluation 08/24/20    Authorization Type Eval 06/29/2020    PT Start Time 1305    PT Stop Time 1346    PT Time Calculation (min) 41 min    Equipment Utilized During Treatment Gait belt    Activity Tolerance Patient tolerated treatment well    Behavior During Therapy [facility] for tasks assessed/performed           Past Medical History:  Diagnosis Date   Basal cell carcinoma 10/06/2015   Left nasal tip, basosquamous    Basal cell carcinoma ? 2014   R posterior scalp, MOHs   Carpal tunnel syndrome    COPD (chronic obstructive pulmonary disease) (HCC)    Dyspnea    Family history of adverse reaction to anesthesia    sister hard time waking up   GCA (giant cell arteritis) ([place])    H/O multiple pulmonary nodules    Hypertension    Hyponatremia    Hypothyroidism    Osteoarthritis    Seasonal allergies    Thyroid disease    Tingling in extremities     Past Surgical History:  Procedure Laterality Date   APPENDECTOMY     ARTERY BIOPSY Left 07/31/2018   Procedure: BIOPSY TEMPORAL ARTERY;  Surgeon: Benjamine Sprague, DO;  Location: [facility] ORS;  Service: General;  Laterality: Left;   BREAST EXCISIONAL BIOPSY Left 1954   neg   [place] OF UTERUS     EYE SURGERY Bilateral    cataract   KNEE ARTHROSCOPY     LAPAROTOMY N/A 04/20/2019   Procedure: EXPLORATORY LAPAROTOMY Small Bowel Obstruction;  Surgeon: Jules Husbands, MD;  Location: [facility] ORS;  Service: General;  Laterality: N/A;   NASAL POLYP  SURGERY     TONSILLECTOMY     TOTAL HIP ARTHROPLASTY Right 03/26/2019   Procedure: RIGHT TOTAL HIP ARTHROPLASTY;  Surgeon: Hessie Knows, MD;  Location: [facility] ORS;  Service: Orthopedics;  Laterality: Right;    There were no vitals filed for this visit.   Subjective Assessment - 08/04/20 1306    Subjective Patient states feels she is doing better. Patient states she has been having less problems with standing up and the dizziness that she has had has been milder and lesser.    Pertinent History History obtained from patient report and medical record.  Patient was referred for vestibular therapy by Dr. Richardson Landry, ENT physician.  Patient has approximately 5-year history of intermittent episodes of dizziness and vertigo and a history of BPPV. Patient reports in January, she had vertigo and BPPV. In [place] she saw Dr. Richardson Landry and Brett Fairy PA and received treatment maneuvers for right BPPV.  Patient was referred to neurology.  Per medical record, patient had a positive VNG for peripheral hypofunction, but does not specify laterality. Patient states that she has intermittent dizziness symptoms. Patient reports she gets lightheadedness especially when she gets out of bed. Patient states she states she sits for 10 seconds or more and again when  she first stands up. Patient reports that the mornings are more challenging due to dizziness and imbalance. Patient reports she had 2 episodes of vertigo in the past 2 months and she had to grab hold for support when she was getting out of bed. Patient said she saw Eden Emms, PT and states she saw him for therapy and states she is not doing the exercises currently.  Patient describes Brant-Daroff exercises and states she does these occasionally and at times this brings on her dizziness symptoms. Patient reports changes in position and head position bring on her dizziness and states she gets vertigo if she tries to tip her head back when putting in eye drops. Patient  reports rolling over in bed sometimes brings on her symptoms, but states it is not drastic. Patient reports the more active she is during the day helps to decrease her dizziness. Patient reports walking helps her to feel better.  Patient reports she walks twice a day for three quarters of a mile each.  Patient states that her head just does not feel right.  Patient reports her symptoms of dizziness can last seconds or longer before she can function and states then her "function is not up to par".    Diagnostic tests VNG demonstrating peripheral hypofunction per medical record    Patient Stated Goals To have decreased dizziness and lightheadedness    Currently in Pain? No/denies            Neuromuscular Re-education:  Patient reports she has been trying the orthostatic strategies in the morning and she feels it helps sometimes and other times not. Patient reports that she has been having less problems with standing up and the dizziness that she has had has been milder and lesser this past week.   Ambulation with head turns:  Patient performed 125' trials of forwards and retro ambulation with horizontal and then diagonal head turns with CGA.  Patient able to maintain fair cadence with retro ambulation with head turns and noted a few mild veers/uneven steps, but improved with practice.   Ball tracking : Patient performed static standing while holding ball while moving ball horizontally while tracking ball with head and eyes with verbal cues to turn the head. Attempted ball toss to self but patient had difficulty coordinating catching and became frustrated with task so tried tracking/ball follows in stead. Performed 125' forward ambulation while doing ball follows horizontally with CGA. Patient did well with this activity and denied dizziness.   Ball toss over shoulder: Patient performed multiple 125' trials of forward and retro ambulation while tossing ball over one shoulder with return catch over  opposite shoulder with CGA.  Patient's balance challenged by these activities with some uneven steppage but no overt losses of balance and patient denies dizziness.   Airex balance beam: On Airex balance beam, performed sideways stance feet progressions (able to progress to feet together) static holds with horizontal and vertical head turns.  On Airex balance beam, performed side-stepping without and then with horizontal head turns 5' times multiple reps of each with CGA. On Airex balance beam, performed tandem stance static holds with and without horizontal and vertical head turns. On Airex balance beam, performed feet together with eyes closed static 30 second holds and then eyes open with body turns with CGA. Patient unsteady at times with activities on balance beam and needed to reach several times to regain her balance, but she did first try to utilize ankle and hip strategies for balance  recovery. Patient is able to tell when she is off balance and tries to correct, but has difficulty at times trying to regain her balance.   Cone tapping: On firm surface, patient performed foot tapping to cones in series of one, two and then three targets as called out by therapist. Patient occasionally touching // bar for support due to losses of balance. In standing on Airex pad, patient performed foot tapping to cones in series of one and two cones as called out by therapist with CGA/Min A.  Patient with multiple small losses of balance where she needed to reach // bars for support.  Patient improved with practice and was able to stand on one foot and do 3 target series at a time. Discussed doing slow marching with 3 second holds for home exercise program and added this to HEP with handout provided. Discussed safety with performing slow marching by standing in corner with chair in front. Patient verbalized understanding.      PT Education - 08/04/20 1306    Education Details discussed home exercise  program and added slow marching standing in corner with chair in front for safety to home exercise program    Person(s) Educated Patient    Methods Explanation;Handout;Verbal cues    Comprehension Verbalized understanding            PT Short Term Goals - 08/04/20 1407      PT SHORT TERM GOAL #1   Title Pt will be independent with HEP in order to decrease dizziness and improve function at home.    Time 4    Period Weeks    Status Achieved    Target Date 07/27/20             PT Long Term Goals - 06/29/20 2057      PT LONG TERM GOAL #1   Title Patient will report 50% or greater improvement in her symptoms of dizziness and imbalance with provoking motions or positions    Baseline Reports 6-7/10 dizziness on 06/29/2020 with moving from supine to sitting;    Time 8    Period Weeks    Status New    Target Date 08/24/20      PT LONG TERM GOAL #2   Title Pt will report no further episodes episodes of feeling "swimmy-headed" when getting out of bed in the morning when using orthostatic hypotension strategies.    Baseline 09/27/19: daily symptoms; 06/29/2020 daily symptoms    Time 8    Period Weeks    Status New    Target Date 08/24/20                 Plan - 08/04/20 1420    Clinical Impression Statement Patient reports that she has noticed a decrease in her dizziness symptoms with fewer episodes and less intensity this past week. In addtion, patient reports that she has been trying some of the orthostatic strategies that we discussed in the morning when she goes to get out of bed and reports some of the time it is helping. Patient performed a variety of higher level balance activites this date and did not report dizziness but rather unsteadiness at times which is an improvement as compared to prior sessions. Added slow marching on firm surface to home exercise program. Patient would benefit from continued PT services to further address goals and functional deficits.     Personal Factors and Comorbidities Comorbidity 3+;Time since onset of injury/illness/exacerbation    Comorbidities HTN, OA,  hypothyroidism, giant cell arteritis, COPD    Examination-Activity Limitations Stand;Other   getting out of bed when first getting up in am   Stability/Clinical Decision Making Evolving/Moderate complexity    Rehab Potential Good    PT Frequency 1x / week    PT Duration 8 weeks    PT Treatment/Interventions ADLs/Self Care Home Management;Canalith Repostioning;Gait training;Stair training;Therapeutic exercise;Balance training;Neuromuscular re-education;Patient/family education;Therapeutic activities;Vestibular    PT Next Visit Plan Retesting and treatment for BPPV, Continue with adaptation and habituation exercises    Consulted and Agree with Plan of Care Patient           Patient will benefit from skilled therapeutic intervention in order to improve the following deficits and impairments:  Decreased balance, Dizziness  Visit Diagnosis: Dizziness and giddiness     Problem List Patient Active Problem List   Diagnosis Date Noted   SBO (small bowel obstruction) ([place]) 04/18/2019   Status post total hip replacement, right 03/26/2019   Temporal arteritis ([place]) 09/03/2018   Leukocytosis 08/12/2018   Pneumonia 12/04/2016   Lady Deutscher PT, DPT [phone] Lady Deutscher 08/04/2020, 2:27 PM  [place] MAIN [facility] SERVICES [address] [place], [place], [address] Phone: [phone]   Fax:  [phone]  Name: Mary Barker MRN: [id number] Date of Birth: [birth year]

## 2020-08-11 DIAGNOSIS — H353211 Exudative age-related macular degeneration, right eye, with active choroidal neovascularization: Secondary | ICD-10-CM | POA: Diagnosis not present

## 2020-08-13 ENCOUNTER — Other Ambulatory Visit: Payer: Self-pay

## 2020-08-13 ENCOUNTER — Encounter: Payer: Self-pay | Admitting: Physical Therapy

## 2020-08-13 ENCOUNTER — Ambulatory Visit: Payer: PPO | Attending: Otolaryngology | Admitting: Physical Therapy

## 2020-08-13 DIAGNOSIS — M316 Other giant cell arteritis: Secondary | ICD-10-CM | POA: Diagnosis not present

## 2020-08-13 DIAGNOSIS — R42 Dizziness and giddiness: Secondary | ICD-10-CM | POA: Insufficient documentation

## 2020-08-13 DIAGNOSIS — R0602 Shortness of breath: Secondary | ICD-10-CM | POA: Diagnosis not present

## 2020-08-13 DIAGNOSIS — I10 Essential (primary) hypertension: Secondary | ICD-10-CM | POA: Diagnosis not present

## 2020-08-13 NOTE — Therapy (Signed)
[place] MAIN [facility] SERVICES [address] [place], [place], [address] Phone: [phone]   Fax:  [phone]  Physical Therapy Treatment  Patient Details  Name: Mary Barker MRN: [id number] Date of Birth: [birth year] Referring Provider (PT): Dr. Richardson Landry   Encounter Date: 08/13/2020   PT End of Session - 08/13/20 1259    Visit Number 7    Number of Visits 9    Date for PT Re-Evaluation 08/24/20    Authorization Type Eval 06/29/2020    PT Start Time 1259    PT Stop Time 1333    PT Time Calculation (min) 34 min    Equipment Utilized During Treatment Gait belt    Activity Tolerance Patient tolerated treatment well    Behavior During Therapy [facility] for tasks assessed/performed           Past Medical History:  Diagnosis Date  . Basal cell carcinoma 10/06/2015   Left nasal tip, basosquamous   . Basal cell carcinoma ? 2014   R posterior scalp, MOHs  . Carpal tunnel syndrome   . COPD (chronic obstructive pulmonary disease) ([place])   . Dyspnea   . Family history of adverse reaction to anesthesia    sister hard time waking up  . GCA (giant cell arteritis) ([place])   . H/O multiple pulmonary nodules   . Hypertension   . Hyponatremia   . Hypothyroidism   . Osteoarthritis   . Seasonal allergies   . Thyroid disease   . Tingling in extremities     Past Surgical History:  Procedure Laterality Date  . APPENDECTOMY    . ARTERY BIOPSY Left 07/31/2018   Procedure: BIOPSY TEMPORAL ARTERY;  Surgeon: Benjamine Sprague, DO;  Location: [facility] ORS;  Service: General;  Laterality: Left;  . BREAST EXCISIONAL BIOPSY Left 1954   neg  . BREAST SURGERY    . COLONOSCOPY    . DILATION AND CURETTAGE OF UTERUS    . EYE SURGERY Bilateral    cataract  . KNEE ARTHROSCOPY    . LAPAROTOMY N/A 04/20/2019   Procedure: EXPLORATORY LAPAROTOMY Small Bowel Obstruction;  Surgeon: Jules Husbands, MD;  Location: [facility] ORS;  Service: General;  Laterality: N/A;  . NASAL POLYP  SURGERY    . TONSILLECTOMY    . TOTAL HIP ARTHROPLASTY Right 03/26/2019   Procedure: RIGHT TOTAL HIP ARTHROPLASTY;  Surgeon: Hessie Knows, MD;  Location: [facility] ORS;  Service: Orthopedics;  Laterality: Right;    There were no vitals filed for this visit.   Subjective Assessment - 08/13/20 1258    Subjective Patient states Saturday night she was reaching up to put a wreath on her door and she got very dizzy and since then she said whenever she rolls over in bed her head spins. Patient reports she has a follow-up appointment with Dr. Richardson Landry next week 12/16 and her cardiologist this afternoon. Patient reports she is going to discuss her dizziness issues with her doctors.    Pertinent History History obtained from patient report and medical record.  Patient was referred for vestibular therapy by Dr. Richardson Landry, ENT physician.  Patient has approximately 5-year history of intermittent episodes of dizziness and vertigo and a history of BPPV. Patient reports in January, she had vertigo and BPPV. In [place] she saw Dr. Richardson Landry and Brett Fairy PA and received treatment maneuvers for right BPPV.  Patient was referred to neurology.  Per medical record, patient had a positive VNG for peripheral hypofunction, but does not specify  laterality. Patient states that she has intermittent dizziness symptoms. Patient reports she gets lightheadedness especially when she gets out of bed. Patient states she states she sits for 10 seconds or more and again when she first stands up. Patient reports that the mornings are more challenging due to dizziness and imbalance. Patient reports she had 2 episodes of vertigo in the past 2 months and she had to grab hold for support when she was getting out of bed. Patient said she saw Eden Emms, PT and states she saw him for therapy and states she is not doing the exercises currently.  Patient describes Brant-Daroff exercises and states she does these occasionally and at times this brings on  her dizziness symptoms. Patient reports changes in position and head position bring on her dizziness and states she gets vertigo if she tries to tip her head back when putting in eye drops. Patient reports rolling over in bed sometimes brings on her symptoms, but states it is not drastic. Patient reports the more active she is during the day helps to decrease her dizziness. Patient reports walking helps her to feel better.  Patient reports she walks twice a day for three quarters of a mile each.  Patient states that her head just does not feel right.  Patient reports her symptoms of dizziness can last seconds or longer before she can function and states then her "function is not up to par".    Diagnostic tests VNG demonstrating peripheral hypofunction per medical record    Patient Stated Goals To have decreased dizziness and lightheadedness           Neuromuscular Re-education:  Dix-Hallpike: Performed left and right Dix-Hallpike tests and both were negative with patient denying vertigo and no nystagmus observed. Patient does however, report dizziness with right testing. Performed left and right horizontal canal testing and both were negative with patient denying vertigo and no nystagmus observed.  Rockerboard: On small wooden rocker board, worked on side to side and anterior/posterior sways with and without horizontal and vertical head turns with contact-guard assistance. On medium wooden rocker board, worked on side to side and anterior/posterior sways with and without horizontal and vertical head turns with contact-guard assistance. Patient reports increased dizziness with anterior/posterior sways with vertical head turns.  Slow Marching: Patient performed on firm surface, slow marching with 5-second holds 10 reps each leg with contact-guard assist and patient intermittently touching with 3 finger support of 1 hand for balance. Patient reports that she feels better about being able to perform  this for her home exercise program after practicing again.  Semitandem balance: Patient performed on firm surface semitandem progressions with alternating lead leg with horizontal and vertical head turns and then with body turns with contact-guard assistance. Patient reports mild dizziness with these activities.  Patient able to advance forward foot further then with prior visits and discussed this progression for her home exercise program.  Performed vestibular basilar insufficiency screen sitting in chair and this did not reproduce any symptoms of dizziness or other concerning symptoms for patient.    PT Education - 08/13/20 1259    Education Details reviewed home exercise program; discussed progressions and using light pressure 3 finger assist of 1 hand on chair for slow marching activity and patient reports that she feels that she will now be able to do this exercise better at home.    Person(s) Educated Patient    Methods Explanation    Comprehension Verbalized understanding  PT Short Term Goals - 08/04/20 1407      PT SHORT TERM GOAL #1   Title Pt will be independent with HEP in order to decrease dizziness and improve function at home.    Time 4    Period Weeks    Status Achieved    Target Date 07/27/20             PT Long Term Goals - 06/29/20 2057      PT LONG TERM GOAL #1   Title Patient will report 50% or greater improvement in her symptoms of dizziness and imbalance with provoking motions or positions    Baseline Reports 6-7/10 dizziness on 06/29/2020 with moving from supine to sitting;    Time 8    Period Weeks    Status New    Target Date 08/24/20      PT LONG TERM GOAL #2   Title Pt will report no further episodes episodes of feeling "swimmy-headed" when getting out of bed in the morning when using orthostatic hypotension strategies.    Baseline 09/27/19: daily symptoms; 06/29/2020 daily symptoms    Time 8    Period Weeks    Status New    Target  Date 08/24/20                 Plan - 08/13/20 1259    Clinical Impression Statement Patient had been reporting decreased dizziness symptoms however this states she returns to clinic saying that she has had increased difficulty since Saturday when she was trying to hang a wreath on a door and had difficulty looking up.  Retested Dix-Hallpike and horizontal canal testing bilaterally and all were negative with no nystagmus observed and patient denying vertigo.  Patient reports that she has a follow-up appointment with Dr. Richardson Landry, ENT physician, next week and that she has an appointment with Dr. Ubaldo Glassing, cardiologist, this afternoon.  Encourage patient to discuss her persistent symptoms of dizziness with her physicians.  Patient able to progress foot placement with semitandem stance with head turns and body turns this date.  Patient did well with rocker board activities but reports that anterior/posterior sways with vertical head turns did increase her dizziness symptoms.  Consider retesting functional outcome measures in the next visit or 2 and updating goals.  Patient would benefit from continued skilled PT services to further address goals and functional deficits.    Personal Factors and Comorbidities Comorbidity 3+;Time since onset of injury/illness/exacerbation    Comorbidities HTN, OA, hypothyroidism, giant cell arteritis, COPD    Examination-Activity Limitations Stand;Other   getting out of bed when first getting up in am   Stability/Clinical Decision Making Evolving/Moderate complexity    Rehab Potential Good    PT Frequency 1x / week    PT Duration 8 weeks    PT Treatment/Interventions ADLs/Self Care Home Management;Canalith Repostioning;Gait training;Stair training;Therapeutic exercise;Balance training;Neuromuscular re-education;Patient/family education;Therapeutic activities;Vestibular    PT Next Visit Plan Retesting and treatment for BPPV, Continue with adaptation and habituation exercises     Consulted and Agree with Plan of Care Patient           Patient will benefit from skilled therapeutic intervention in order to improve the following deficits and impairments:  Decreased balance,Dizziness  Visit Diagnosis: Dizziness and giddiness     Problem List Patient Active Problem List   Diagnosis Date Noted  . SBO (small bowel obstruction) ([place]) 04/18/2019  . Status post total hip replacement, right 03/26/2019  . Temporal arteritis ([place]) 09/03/2018  .  Leukocytosis 08/12/2018  . Pneumonia 12/04/2016   Lady Deutscher PT, DPT [phone] Lady Deutscher 08/13/2020, 5:11 PM  [place] MAIN [facility] [address] [place], [place], [address] Phone: [phone]   Fax:  [phone]  Name: ZAARA SPROWL MRN: [id number] Date of Birth: 20-Mar-1930

## 2020-08-17 ENCOUNTER — Other Ambulatory Visit: Payer: Self-pay | Admitting: Family Medicine

## 2020-08-17 DIAGNOSIS — Z1231 Encounter for screening mammogram for malignant neoplasm of breast: Secondary | ICD-10-CM

## 2020-08-18 ENCOUNTER — Other Ambulatory Visit: Payer: Self-pay

## 2020-08-18 ENCOUNTER — Encounter: Payer: Self-pay | Admitting: Physical Therapy

## 2020-08-18 ENCOUNTER — Ambulatory Visit: Payer: PPO | Admitting: Physical Therapy

## 2020-08-18 DIAGNOSIS — R42 Dizziness and giddiness: Secondary | ICD-10-CM

## 2020-08-18 NOTE — Therapy (Signed)
[place] [facility] SERVICES [address] [place], [place], [address] Phone: [phone]   Fax:  [phone]  Physical Therapy Treatment/Discharge Summary Dates of Service: 06/29/20-08/18/2020 Total number of visits: 8  Patient Details  Name: Mary Barker MRN: [id number] Date of Birth: 07-22-1930 Referring Provider (PT): Dr. Richardson Landry   Encounter Date: 08/18/2020   PT End of Session - 08/18/20 1117    Visit Number 8    Number of Visits 9    Date for PT Re-Evaluation 08/24/20    Authorization Type Eval 06/29/2020    PT Start Time 1117    PT Stop Time 1150    PT Time Calculation (min) 33 min    Equipment Utilized During Treatment Gait belt    Activity Tolerance Patient tolerated treatment well    Behavior During Therapy [facility] for tasks assessed/performed           Past Medical History:  Diagnosis Date  . Basal cell carcinoma 10/06/2015   Left nasal tip, basosquamous   . Basal cell carcinoma ? 2014   R posterior scalp, MOHs  . Carpal tunnel syndrome   . COPD (chronic obstructive pulmonary disease) ([place])   . Dyspnea   . Family history of adverse reaction to anesthesia    sister hard time waking up  . GCA (giant cell arteritis) ([place])   . H/O multiple pulmonary nodules   . Hypertension   . Hyponatremia   . Hypothyroidism   . Osteoarthritis   . Seasonal allergies   . Thyroid disease   . Tingling in extremities     Past Surgical History:  Procedure Laterality Date  . APPENDECTOMY    . ARTERY BIOPSY Left 07/31/2018   Procedure: BIOPSY TEMPORAL ARTERY;  Surgeon: Benjamine Sprague, DO;  Location: [facility] ORS;  Service: General;  Laterality: Left;  . BREAST EXCISIONAL BIOPSY Left 1954   neg  . BREAST SURGERY    . COLONOSCOPY    . DILATION AND CURETTAGE OF UTERUS    . EYE SURGERY Bilateral    cataract  . KNEE ARTHROSCOPY    . LAPAROTOMY N/A 04/20/2019   Procedure: EXPLORATORY LAPAROTOMY Small Bowel Obstruction;  Surgeon: Jules Husbands, MD;  Location: [facility] ORS;  Service: General;  Laterality: N/A;  . NASAL POLYP SURGERY    . TONSILLECTOMY    . TOTAL HIP ARTHROPLASTY Right 03/26/2019   Procedure: RIGHT TOTAL HIP ARTHROPLASTY;  Surgeon: Hessie Knows, MD;  Location: [facility] ORS;  Service: Orthopedics;  Laterality: Right;    There were no vitals filed for this visit.   Subjective Assessment - 08/18/20 1119    Subjective Patient reports that when she rolled towards her side and another time when she went from supine to sit, she feels like the window is moving up and down. Patient states she cannot hardly change positions in bed without having dizziness. Patient states it does not last long and then she can get to sleep once it eases. Patient reports that she is going for a follow-up appointment with Dr. Richardson Landry on Thursday. She is going to discuss her continued dizziness symptoms and concerns with the doctor. Patient states she saw her cardiologist, Dr. Ubaldo Glassing, last week and that he was going to order a posterior blood circulation test of the brain but wanted to have patient do her follow-up visit with Dr. Richardson Landry first. Patient states if Dr. Richardson Landry does not order the test then Dr. Ubaldo Glassing is going to order it.  Pertinent History History obtained from patient report and medical record.  Patient was referred for vestibular therapy by Dr. Richardson Landry, ENT physician.  Patient has approximately 5-year history of intermittent episodes of dizziness and vertigo and a history of BPPV. Patient reports in January, she had vertigo and BPPV. In [place] she saw Dr. Richardson Landry and Brett Fairy PA and received treatment maneuvers for right BPPV.  Patient was referred to neurology.  Per medical record, patient had a positive VNG for peripheral hypofunction, but does not specify laterality. Patient states that she has intermittent dizziness symptoms. Patient reports she gets lightheadedness especially when she gets out of bed. Patient states she states she  sits for 10 seconds or more and again when she first stands up. Patient reports that the mornings are more challenging due to dizziness and imbalance. Patient reports she had 2 episodes of vertigo in the past 2 months and she had to grab hold for support when she was getting out of bed. Patient said she saw Eden Emms, PT and states she saw him for therapy and states she is not doing the exercises currently.  Patient describes Brant-Daroff exercises and states she does these occasionally and at times this brings on her dizziness symptoms. Patient reports changes in position and head position bring on her dizziness and states she gets vertigo if she tries to tip her head back when putting in eye drops. Patient reports rolling over in bed sometimes brings on her symptoms, but states it is not drastic. Patient reports the more active she is during the day helps to decrease her dizziness. Patient reports walking helps her to feel better.  Patient reports she walks twice a day for three quarters of a mile each.  Patient states that her head just does not feel right.  Patient reports her symptoms of dizziness can last seconds or longer before she can function and states then her "function is not up to par".    Diagnostic tests VNG demonstrating peripheral hypofunction per medical record    Patient Stated Goals To have decreased dizziness and lightheadedness            Neuromuscular Re-education:  Clinical Test of Sensory Interaction for Balance (CTSIB): CONDITION TIME STRATEGY SWAY  Eyes open, firm surface 30 seconds ankle -  Eyes closed, firm surface 30 seconds ankle +1  Eyes open, foam surface 30 seconds ankle +1  Eyes closed, foam surface 30 seconds ankle +2  Patient demonstrates improvement in decreased sway with eyes closed on foam surface with feet together on the CTSIB test this date.    FUNCTIONAL OUTCOME MEASURES:  Results Comments  DHI 28/100 low perception of handicap  ABC Scale 81% Safe  for community mobility  FOTO 82/100 Fair ability to perform functional activities   Discussed functional outcome measures and compared to prior testing. Discussed goals, plan of care, discharge plan and home exercise program. Patient is independent with home exercise program and reports she has no questions or concerns with HEP. Patient states she also plans on continuing to walk for exercise upon discharge from PT and might try Silver Sneakers program again as well. Patient in agreement with discharge from PT services at this time.      PT Education - 08/18/20 1135    Education Details discussed functional outcome testing and compared to prior testing; discussed goals and plan of care as well as discharge plans    Person(s) Educated Patient    Methods Explanation    Comprehension  Verbalized understanding            PT Short Term Goals - 08/04/20 1407      PT SHORT TERM GOAL #1   Title Pt will be independent with HEP in order to decrease dizziness and improve function at home.    Time 4    Period Weeks    Status Achieved    Target Date 07/27/20             PT Long Term Goals - 08/18/20 1132      PT LONG TERM GOAL #1   Title Patient will report 50% or greater improvement in her symptoms of dizziness and imbalance with provoking motions or positions    Baseline Reports 6-7/10 dizziness on 06/29/2020 with moving from supine to sitting; reports no improvement in her symptoms of dizziness and imbalance    Time 8    Period Weeks    Status Not Met      PT LONG TERM GOAL #2   Title Pt will report no further episodes episodes of feeling "swimmy-headed" when getting out of bed in the morning when using orthostatic hypotension strategies.    Baseline 09/27/19: daily symptoms; 06/29/2020 daily symptoms; reports she is getting some "swimmy-headed" a little bit everyday on 08/18/2020    Time 8    Period Weeks    Status Partially Met                 Plan - 08/18/20 1243     Clinical Impression Statement Repeated functional outcome testing this date. Discussed functional outcome measures and compared to prior testing. Discussed goals, plan of care, discharge plan and home exercise program. Patient achieved 1/1 short term goals, partially met 1/2 long term goal and did not meet remaining long term goal. Patient scored 82/100 on FOTO indicating fair to good ability to perform functional activities. Patient scored 81% on the ABC scale which indicates decreased falls risk and safe for community mobility and scored low perception of handicap on teh Dizziness Handicap Inventory.  Patient is independent with home exercise program and reports she has no questions or concerns with HEP. Patient states she also plans on continuing to walk for exercise upon discharge from PT and might try Silver Sneakers program again as well. Patient in agreement with discharge from PT services at this time. Encouraged patient to continue to follow-up with her physician team in regards to her continued symptoms of dizziness and light headedness symptoms. Will discharge patient from PT services at this time with plan for patient to continue to perform home exercise program to help maintain gains and balance.    Personal Factors and Comorbidities Comorbidity 3+;Time since onset of injury/illness/exacerbation    Comorbidities HTN, OA, hypothyroidism, giant cell arteritis, COPD    Examination-Activity Limitations Stand;Other   getting out of bed when first getting up in am   Stability/Clinical Decision Making Evolving/Moderate complexity    Rehab Potential Good    PT Frequency 1x / week    PT Duration 8 weeks    PT Treatment/Interventions ADLs/Self Care Home Management;Canalith Repostioning;Gait training;Stair training;Therapeutic exercise;Balance training;Neuromuscular re-education;Patient/family education;Therapeutic activities;Vestibular    PT Next Visit Plan Retesting and treatment for BPPV, Continue with  adaptation and habituation exercises    Consulted and Agree with Plan of Care Patient           Patient will benefit from skilled therapeutic intervention in order to improve the following deficits and impairments:  Decreased balance,Dizziness  Visit  Diagnosis: Dizziness and giddiness     Problem List Patient Active Problem List   Diagnosis Date Noted  . SBO (small bowel obstruction) ([place]) 04/18/2019  . Status post total hip replacement, right 03/26/2019  . Temporal arteritis ([place]) 09/03/2018  . Leukocytosis 08/12/2018  . Pneumonia 12/04/2016   Lady Deutscher PT, DPT [phone] Lady Deutscher 08/18/2020, 12:49 PM  [place] [facility] SERVICES [address] [place], [place], [address] Phone: [phone]   Fax:  [phone]  Name: Mary Barker MRN: [id number] Date of Birth: March 27, 1930

## 2020-08-20 DIAGNOSIS — R42 Dizziness and giddiness: Secondary | ICD-10-CM | POA: Diagnosis not present

## 2020-08-20 DIAGNOSIS — J301 Allergic rhinitis due to pollen: Secondary | ICD-10-CM | POA: Diagnosis not present

## 2020-08-21 ENCOUNTER — Other Ambulatory Visit: Payer: Self-pay | Admitting: Otolaryngology

## 2020-08-21 DIAGNOSIS — R42 Dizziness and giddiness: Secondary | ICD-10-CM

## 2020-08-25 ENCOUNTER — Encounter: Payer: PPO | Admitting: Physical Therapy

## 2020-08-27 DIAGNOSIS — Z20822 Contact with and (suspected) exposure to covid-19: Secondary | ICD-10-CM | POA: Diagnosis not present

## 2020-09-02 DIAGNOSIS — R5383 Other fatigue: Secondary | ICD-10-CM | POA: Diagnosis not present

## 2020-09-03 ENCOUNTER — Encounter: Payer: PPO | Admitting: Physical Therapy

## 2020-09-08 ENCOUNTER — Ambulatory Visit
Admission: RE | Admit: 2020-09-08 | Discharge: 2020-09-08 | Disposition: A | Discharge status: Home / Self Care | Payer: PPO | Source: Ambulatory Visit | Attending: Otolaryngology | Admitting: Otolaryngology

## 2020-09-08 ENCOUNTER — Other Ambulatory Visit: Payer: Self-pay

## 2020-09-08 DIAGNOSIS — R42 Dizziness and giddiness: Secondary | ICD-10-CM | POA: Insufficient documentation

## 2020-09-08 LAB — POCT I-STAT CREATININE: Creatinine, Ser: 0.8 mg/dL (ref 0.44–1.00)

## 2020-09-08 MED ORDER — IOHEXOL 350 MG/ML SOLN
75.0000 mL | Freq: Once | INTRAVENOUS | Status: AC | PRN
  Administered 2020-09-08: 75 mL via INTRAVENOUS

## 2020-09-21 ENCOUNTER — Other Ambulatory Visit: Payer: PPO

## 2020-09-23 ENCOUNTER — Ambulatory Visit
Admission: RE | Admit: 2020-09-23 | Discharge: 2020-09-23 | Disposition: A | Discharge status: Home / Self Care | Payer: PPO | Source: Ambulatory Visit | Attending: Family Medicine | Admitting: Family Medicine

## 2020-09-23 ENCOUNTER — Other Ambulatory Visit: Payer: Self-pay

## 2020-09-23 DIAGNOSIS — Z1231 Encounter for screening mammogram for malignant neoplasm of breast: Secondary | ICD-10-CM | POA: Insufficient documentation

## 2020-09-28 DIAGNOSIS — H16223 Keratoconjunctivitis sicca, not specified as Sjogren's, bilateral: Secondary | ICD-10-CM | POA: Diagnosis not present

## 2020-09-28 DIAGNOSIS — H353211 Exudative age-related macular degeneration, right eye, with active choroidal neovascularization: Secondary | ICD-10-CM | POA: Diagnosis not present

## 2020-10-02 DIAGNOSIS — E039 Hypothyroidism, unspecified: Secondary | ICD-10-CM | POA: Diagnosis not present

## 2020-10-02 DIAGNOSIS — I10 Essential (primary) hypertension: Secondary | ICD-10-CM | POA: Diagnosis not present

## 2020-10-05 DIAGNOSIS — R42 Dizziness and giddiness: Secondary | ICD-10-CM | POA: Diagnosis not present

## 2020-10-05 DIAGNOSIS — M65341 Trigger finger, right ring finger: Secondary | ICD-10-CM | POA: Diagnosis not present

## 2020-10-07 DIAGNOSIS — Z0000 Encounter for general adult medical examination without abnormal findings: Secondary | ICD-10-CM | POA: Diagnosis not present

## 2020-10-07 DIAGNOSIS — E039 Hypothyroidism, unspecified: Secondary | ICD-10-CM | POA: Diagnosis not present

## 2020-10-07 DIAGNOSIS — I10 Essential (primary) hypertension: Secondary | ICD-10-CM | POA: Diagnosis not present

## 2020-10-07 DIAGNOSIS — R42 Dizziness and giddiness: Secondary | ICD-10-CM | POA: Diagnosis not present

## 2020-10-12 DIAGNOSIS — H16223 Keratoconjunctivitis sicca, not specified as Sjogren's, bilateral: Secondary | ICD-10-CM | POA: Diagnosis not present

## 2020-10-21 DIAGNOSIS — J449 Chronic obstructive pulmonary disease, unspecified: Secondary | ICD-10-CM | POA: Diagnosis not present

## 2020-10-21 DIAGNOSIS — M810 Age-related osteoporosis without current pathological fracture: Secondary | ICD-10-CM | POA: Diagnosis not present

## 2020-10-26 DIAGNOSIS — H353211 Exudative age-related macular degeneration, right eye, with active choroidal neovascularization: Secondary | ICD-10-CM | POA: Diagnosis not present

## 2020-10-27 DIAGNOSIS — H16223 Keratoconjunctivitis sicca, not specified as Sjogren's, bilateral: Secondary | ICD-10-CM | POA: Diagnosis not present

## 2020-10-27 DIAGNOSIS — B351 Tinea unguium: Secondary | ICD-10-CM | POA: Diagnosis not present

## 2020-10-27 DIAGNOSIS — L600 Ingrowing nail: Secondary | ICD-10-CM | POA: Diagnosis not present

## 2020-10-27 DIAGNOSIS — M79675 Pain in left toe(s): Secondary | ICD-10-CM | POA: Diagnosis not present

## 2020-10-27 DIAGNOSIS — M79674 Pain in right toe(s): Secondary | ICD-10-CM | POA: Diagnosis not present

## 2020-11-06 DIAGNOSIS — R42 Dizziness and giddiness: Secondary | ICD-10-CM | POA: Diagnosis not present

## 2020-11-06 DIAGNOSIS — H903 Sensorineural hearing loss, bilateral: Secondary | ICD-10-CM | POA: Diagnosis not present

## 2020-11-30 DIAGNOSIS — H16223 Keratoconjunctivitis sicca, not specified as Sjogren's, bilateral: Secondary | ICD-10-CM | POA: Diagnosis not present

## 2021-01-06 DIAGNOSIS — H16223 Keratoconjunctivitis sicca, not specified as Sjogren's, bilateral: Secondary | ICD-10-CM | POA: Diagnosis not present

## 2021-01-11 DIAGNOSIS — Z20822 Contact with and (suspected) exposure to covid-19: Secondary | ICD-10-CM | POA: Diagnosis not present

## 2021-01-11 DIAGNOSIS — H353211 Exudative age-related macular degeneration, right eye, with active choroidal neovascularization: Secondary | ICD-10-CM | POA: Diagnosis not present

## 2021-01-11 DIAGNOSIS — H353122 Nonexudative age-related macular degeneration, left eye, intermediate dry stage: Secondary | ICD-10-CM | POA: Diagnosis not present

## 2021-01-13 DIAGNOSIS — J40 Bronchitis, not specified as acute or chronic: Secondary | ICD-10-CM | POA: Diagnosis not present

## 2021-01-15 DIAGNOSIS — J441 Chronic obstructive pulmonary disease with (acute) exacerbation: Secondary | ICD-10-CM | POA: Diagnosis not present

## 2021-01-18 DIAGNOSIS — H16223 Keratoconjunctivitis sicca, not specified as Sjogren's, bilateral: Secondary | ICD-10-CM | POA: Diagnosis not present

## 2021-01-27 DIAGNOSIS — Z87898 Personal history of other specified conditions: Secondary | ICD-10-CM | POA: Diagnosis not present

## 2021-01-27 DIAGNOSIS — R058 Other specified cough: Secondary | ICD-10-CM | POA: Diagnosis not present

## 2021-01-27 DIAGNOSIS — J449 Chronic obstructive pulmonary disease, unspecified: Secondary | ICD-10-CM | POA: Diagnosis not present

## 2021-01-27 DIAGNOSIS — R059 Cough, unspecified: Secondary | ICD-10-CM | POA: Diagnosis not present

## 2021-02-08 DIAGNOSIS — Z79899 Other long term (current) drug therapy: Secondary | ICD-10-CM | POA: Diagnosis not present

## 2021-02-08 DIAGNOSIS — R531 Weakness: Secondary | ICD-10-CM | POA: Diagnosis not present

## 2021-02-10 DIAGNOSIS — M6281 Muscle weakness (generalized): Secondary | ICD-10-CM | POA: Diagnosis not present

## 2021-02-10 DIAGNOSIS — R278 Other lack of coordination: Secondary | ICD-10-CM | POA: Diagnosis not present

## 2021-02-10 DIAGNOSIS — R42 Dizziness and giddiness: Secondary | ICD-10-CM | POA: Diagnosis not present

## 2021-02-11 DIAGNOSIS — J449 Chronic obstructive pulmonary disease, unspecified: Secondary | ICD-10-CM | POA: Diagnosis not present

## 2021-02-11 DIAGNOSIS — R053 Chronic cough: Secondary | ICD-10-CM | POA: Diagnosis not present

## 2021-02-11 DIAGNOSIS — R0600 Dyspnea, unspecified: Secondary | ICD-10-CM | POA: Diagnosis not present

## 2021-02-15 DIAGNOSIS — M6281 Muscle weakness (generalized): Secondary | ICD-10-CM | POA: Diagnosis not present

## 2021-02-15 DIAGNOSIS — H16223 Keratoconjunctivitis sicca, not specified as Sjogren's, bilateral: Secondary | ICD-10-CM | POA: Diagnosis not present

## 2021-02-15 DIAGNOSIS — R278 Other lack of coordination: Secondary | ICD-10-CM | POA: Diagnosis not present

## 2021-02-15 DIAGNOSIS — R42 Dizziness and giddiness: Secondary | ICD-10-CM | POA: Diagnosis not present

## 2021-02-20 DIAGNOSIS — J40 Bronchitis, not specified as acute or chronic: Secondary | ICD-10-CM | POA: Diagnosis not present

## 2021-02-20 DIAGNOSIS — Z9849 Cataract extraction status, unspecified eye: Secondary | ICD-10-CM | POA: Diagnosis not present

## 2021-02-20 DIAGNOSIS — E079 Disorder of thyroid, unspecified: Secondary | ICD-10-CM | POA: Diagnosis not present

## 2021-02-20 DIAGNOSIS — H547 Unspecified visual loss: Secondary | ICD-10-CM | POA: Diagnosis not present

## 2021-02-20 DIAGNOSIS — H3530 Unspecified macular degeneration: Secondary | ICD-10-CM | POA: Diagnosis not present

## 2021-02-20 DIAGNOSIS — H353290 Exudative age-related macular degeneration, unspecified eye, stage unspecified: Secondary | ICD-10-CM | POA: Diagnosis not present

## 2021-02-20 DIAGNOSIS — H538 Other visual disturbances: Secondary | ICD-10-CM | POA: Diagnosis not present

## 2021-02-20 DIAGNOSIS — Z87891 Personal history of nicotine dependence: Secondary | ICD-10-CM | POA: Diagnosis not present

## 2021-02-20 DIAGNOSIS — H353210 Exudative age-related macular degeneration, right eye, stage unspecified: Secondary | ICD-10-CM | POA: Diagnosis not present

## 2021-02-20 DIAGNOSIS — M329 Systemic lupus erythematosus, unspecified: Secondary | ICD-10-CM | POA: Diagnosis not present

## 2021-02-20 DIAGNOSIS — I10 Essential (primary) hypertension: Secondary | ICD-10-CM | POA: Diagnosis not present

## 2021-02-20 DIAGNOSIS — Z885 Allergy status to narcotic agent status: Secondary | ICD-10-CM | POA: Diagnosis not present

## 2021-02-20 DIAGNOSIS — Z7982 Long term (current) use of aspirin: Secondary | ICD-10-CM | POA: Diagnosis not present

## 2021-02-20 DIAGNOSIS — Z79899 Other long term (current) drug therapy: Secondary | ICD-10-CM | POA: Diagnosis not present

## 2021-02-22 DIAGNOSIS — H353211 Exudative age-related macular degeneration, right eye, with active choroidal neovascularization: Secondary | ICD-10-CM | POA: Diagnosis not present

## 2021-02-24 DIAGNOSIS — M316 Other giant cell arteritis: Secondary | ICD-10-CM | POA: Diagnosis not present

## 2021-03-16 ENCOUNTER — Other Ambulatory Visit: Payer: Self-pay | Admitting: Dermatology

## 2021-03-16 ENCOUNTER — Ambulatory Visit: Payer: PPO | Admitting: Dermatology

## 2021-03-16 ENCOUNTER — Other Ambulatory Visit: Payer: Self-pay

## 2021-03-16 DIAGNOSIS — Z1283 Encounter for screening for malignant neoplasm of skin: Secondary | ICD-10-CM

## 2021-03-16 DIAGNOSIS — D1800 Hemangioma unspecified site: Secondary | ICD-10-CM | POA: Diagnosis not present

## 2021-03-16 DIAGNOSIS — D489 Neoplasm of uncertain behavior, unspecified: Secondary | ICD-10-CM

## 2021-03-16 DIAGNOSIS — L821 Other seborrheic keratosis: Secondary | ICD-10-CM | POA: Diagnosis not present

## 2021-03-16 DIAGNOSIS — C4492 Squamous cell carcinoma of skin, unspecified: Secondary | ICD-10-CM

## 2021-03-16 DIAGNOSIS — C44722 Squamous cell carcinoma of skin of right lower limb, including hip: Secondary | ICD-10-CM

## 2021-03-16 DIAGNOSIS — Z85828 Personal history of other malignant neoplasm of skin: Secondary | ICD-10-CM | POA: Diagnosis not present

## 2021-03-16 DIAGNOSIS — L820 Inflamed seborrheic keratosis: Secondary | ICD-10-CM | POA: Diagnosis not present

## 2021-03-16 DIAGNOSIS — L578 Other skin changes due to chronic exposure to nonionizing radiation: Secondary | ICD-10-CM

## 2021-03-16 DIAGNOSIS — D229 Melanocytic nevi, unspecified: Secondary | ICD-10-CM

## 2021-03-16 DIAGNOSIS — L814 Other melanin hyperpigmentation: Secondary | ICD-10-CM | POA: Diagnosis not present

## 2021-03-16 HISTORY — DX: Squamous cell carcinoma of skin, unspecified: C44.92

## 2021-03-16 NOTE — Progress Notes (Signed)
Follow-Up Visit   Subjective  Mary Barker is a 87 y.o. female who presents for the following: TBSE (Patient here for full body skin exam and skin cancer screening. Patient with hx of BCC. She is not aware of any new or changing spots. ).  Except for a sore spot on her R lower leg and another irritated growth on her L ankle    The following portions of the chart were reviewed this encounter and updated as appropriate:        Review of Systems:  No other skin or systemic complaints except as noted in HPI or Assessment and Plan.  Objective  Well appearing patient in no apparent distress; mood and affect are within normal limits.  A full examination was performed including scalp, head, eyes, ears, nose, lips, neck, chest, axillae, abdomen, back, buttocks, bilateral upper extremities, bilateral lower extremities, hands, feet, fingers, toes, fingernails, and toenails. All findings within normal limits unless otherwise noted below.  Left anterior ankle x 1, right temporal hairline x 1 (2), Right shoulder x 1, right upper  back x 1, left upper back x 1, mid sternum x 1, left forearm x 1, left hand dorsum x 1, right hand dorsum x 2 Erythematous keratotic or waxy stuck-on papule or plaque.   right upper pretibia 56mm pink firm scaly papule Hypertrophic AK r/o SCC   Assessment & Plan  Inflamed seborrheic keratosis Left anterior ankle x 1, right temporal hairline x 1 (2); Right shoulder x 1, right upper  back x 1, left upper back x 1, mid sternum x 1, left forearm x 1, left hand dorsum x 1, right hand dorsum x 2  Vs hypertrophic AK's  Patient defers treating spots. Will monitor for change.   Treated 2 lesions only with LN2 today at left anterior ankle and right temporal hairline.       Destruction of lesion - Left anterior ankle x 1, right temporal hairline x 1  Destruction method: cryotherapy   Informed consent: discussed and consent obtained   Lesion destroyed using liquid  nitrogen: Yes   Region frozen until ice ball extended beyond lesion: Yes   Outcome: patient tolerated procedure well with no complications   Post-procedure details: wound care instructions given   Additional details:  Prior to procedure, discussed risks of blister formation, small wound, skin dyspigmentation, or rare scar following cryotherapy. Recommend Vaseline ointment to treated areas while healing.   Neoplasm of uncertain behavior right upper pretibia  Skin / nail biopsy Type of biopsy: tangential   Informed consent: discussed and consent obtained   Patient was prepped and draped in usual sterile fashion: Area prepped with alcohol. Anesthesia: the lesion was anesthetized in a standard fashion   Anesthetic:  1% lidocaine w/ epinephrine 1-100,000 buffered w/ 8.4% NaHCO3 Instrument used: flexible razor blade   Hemostasis achieved with: pressure, aluminum chloride and electrodesiccation   Outcome: patient tolerated procedure well    Destruction of lesion  Destruction method: electrodesiccation and curettage   Informed consent: discussed and consent obtained   Timeout:  patient name, date of birth, surgical site, and procedure verified Curettage performed in three different directions: Yes   Electrodesiccation performed over the curetted area: Yes   Final wound size (cm):  0.7 Hemostasis achieved with:  pressure, aluminum chloride and electrodesiccation Outcome: patient tolerated procedure well with no complications   Post-procedure details: wound care instructions given   Additional details:  Mupirocin ointment and Bandaid applied    Specimen  1 - Surgical pathology Differential Diagnosis: Hypertrophic AK r/o SCC  Check Margins: No 42mm pink firm scaly papule   Lentigines - Scattered tan macules - Due to sun exposure - Benign-appering, observe - Recommend daily broad spectrum sunscreen SPF 30+ to sun-exposed areas, reapply every 2 hours as needed. - Call for any  changes  Seborrheic Keratoses - Stuck-on, waxy, tan-brown papules and/or plaques  - Benign-appearing - Discussed benign etiology and prognosis. - Observe - Call for any changes  Melanocytic Nevi - Tan-brown and/or pink-flesh-colored symmetric macules and papules - Benign appearing on exam today - Observation - Call clinic for new or changing moles - Recommend daily use of broad spectrum spf 30+ sunscreen to sun-exposed areas.   Hemangiomas - Red papules - Discussed benign nature - Observe - Call for any changes  Actinic Damage - Chronic condition, secondary to cumulative UV/sun exposure - diffuse scaly erythematous macules with underlying dyspigmentation - Recommend daily broad spectrum sunscreen SPF 30+ to sun-exposed areas, reapply every 2 hours as needed.  - Staying in the shade or wearing long sleeves, sun glasses (UVA+UVB protection) and wide brim hats (4-inch brim around the entire circumference of the hat) are also recommended for sun protection.  - Call for new or changing lesions.  Skin cancer screening performed today.  History of Basal Cell Carcinoma of the Skin - No evidence of recurrence today - Recommend regular full body skin exams - Recommend daily broad spectrum sunscreen SPF 30+ to sun-exposed areas, reapply every 2 hours as needed.  - Call if any new or changing lesions are noted between office visits  Return in about 6 months (around 09/16/2021) for ISK/AK follow up, sun exposd areas.  Graciella Belton, RMA, am acting as scribe for Brendolyn Patty, MD .  Documentation: I have reviewed the above documentation for accuracy and completeness, and I agree with the above.  Brendolyn Patty MD

## 2021-03-16 NOTE — Patient Instructions (Addendum)
Cryotherapy Aftercare  Wash gently with soap and water everyday.   Apply Vaseline and Band-Aid daily until healed.    Wound Care Instructions  Cleanse wound gently with soap and water once a day then pat dry with clean gauze. Apply a thing coat of Petrolatum (petroleum jelly, "Vaseline") over the wound (unless you have an allergy to this). We recommend that you use a new, sterile tube of Vaseline. Do not pick or remove scabs. Do not remove the yellow or white "healing tissue" from the base of the wound.  Cover the wound with fresh, clean, nonstick gauze and secure with paper tape. You may use Band-Aids in place of gauze and tape if the would is small enough, but would recommend trimming much of the tape off as there is often too much. Sometimes Band-Aids can irritate the skin.  You should call the office for your biopsy report after 1 week if you have not already been contacted.  If you experience any problems, such as abnormal amounts of bleeding, swelling, significant bruising, significant pain, or evidence of infection, please call the office immediately.  FOR ADULT SURGERY PATIENTS: If you need something for pain relief you may take 1 extra strength Tylenol (acetaminophen) AND 2 Ibuprofen (200mg  each) together every 4 hours as needed for pain. (do not take these if you are allergic to them or if you have a reason you should not take them.) Typically, you may only need pain medication for 1 to 3 days.    Melanoma ABCDEs  Melanoma is the most dangerous type of skin cancer, and is the leading cause of death from skin disease.  You are more likely to develop melanoma if you: Have light-colored skin, light-colored eyes, or red or blond hair Spend a lot of time in the sun Tan regularly, either outdoors or in a tanning bed Have had blistering sunburns, especially during childhood Have a close family member who has had a melanoma Have atypical moles or large birthmarks  Early detection of  melanoma is key since treatment is typically straightforward and cure rates are extremely high if we catch it early.   The first sign of melanoma is often a change in a mole or a new dark spot.  The ABCDE system is a way of remembering the signs of melanoma.  A for asymmetry:  The two halves do not match. B for border:  The edges of the growth are irregular. C for color:  A mixture of colors are present instead of an even brown color. D for diameter:  Melanomas are usually (but not always) greater than 68mm - the size of a pencil eraser. E for evolution:  The spot keeps changing in size, shape, and color.  Please check your skin once per month between visits. You can use a small mirror in front and a large mirror behind you to keep an eye on the back side or your body.   If you see any new or changing lesions before your next follow-up, please call to schedule a visit.  Please continue daily skin protection including broad spectrum sunscreen SPF 30+ to sun-exposed areas, reapplying every 2 hours as needed when you're outdoors.    If you have any questions or concerns for your doctor, please call our main line at [phone] and press option 4 to reach your doctor's medical assistant. If no one answers, please leave a voicemail as directed and we will return your call as soon as possible. Messages left after  4 pm will be answered the following business day.   You may also send [place] a message via [place]. We typically respond to MyChart messages within 1-2 business days.  For prescription refills, please ask your pharmacy to contact our office. Our fax number is [phone].  If you have an urgent issue when the clinic is closed that cannot wait until the next business day, you can page your doctor at the number below.    Please note that while we do our best to be available for urgent issues outside of office hours, we are not available 24/7.   If you have an urgent issue and are unable to  reach [place], you may choose to seek medical care at your doctor's office, retail clinic, urgent care center, or emergency room.  If you have a medical emergency, please immediately call 911 or go to the emergency department.  Pager Numbers  - Dr. Nehemiah Massed: [phone]  - Dr. Laurence Ferrari: [phone]  - Dr. Nicole Kindred: [phone]  In the event of inclement weather, please call our main line at [phone] for an update on the status of any delays or closures.  Dermatology Medication Tips: Please keep the boxes that topical medications come in in order to help keep track of the instructions about where and how to use these. Pharmacies typically print the medication instructions only on the boxes and not directly on the medication tubes.   If your medication is too expensive, please contact our office at [phone] option 4 or send [place] a message through [place].   We are unable to tell what your co-pay for medications will be in advance as this is different depending on your insurance coverage. However, we may be able to find a substitute medication at lower cost or fill out paperwork to get insurance to cover a needed medication.   If a prior authorization is required to get your medication covered by your insurance company, please allow [place] 1-2 business days to complete this process.  Drug prices often vary depending on where the prescription is filled and some pharmacies may offer cheaper prices.  The website www.goodrx.com contains coupons for medications through different pharmacies. The prices here do not account for what the cost may be with help from insurance (it may be cheaper with your insurance), but the website can give you the price if you did not use any insurance.  - You can print the associated coupon and take it with your prescription to the pharmacy.  - You may also stop by our office during regular business hours and pick up a GoodRx coupon card.  - If you need your prescription  sent electronically to a different pharmacy, notify our office through [facility] or by phone at [phone] option 4.

## 2021-03-22 DIAGNOSIS — H353211 Exudative age-related macular degeneration, right eye, with active choroidal neovascularization: Secondary | ICD-10-CM | POA: Diagnosis not present

## 2021-03-24 ENCOUNTER — Telehealth: Payer: Self-pay

## 2021-03-24 NOTE — Telephone Encounter (Signed)
Advised pt of bx results/sh ?

## 2021-03-24 NOTE — Telephone Encounter (Signed)
-----   Message from Brendolyn Patty, MD sent at 03/24/2021 11:39 AM EDT ----- Skin , right upper pretibia WELL DIFFERENTIATED SQUAMOUS CELL CARCINOMA  SCC skin cancer- already treated with EDC at time of biopsy   - please call patient

## 2021-03-31 DIAGNOSIS — H16223 Keratoconjunctivitis sicca, not specified as Sjogren's, bilateral: Secondary | ICD-10-CM | POA: Diagnosis not present

## 2021-04-06 DIAGNOSIS — M1990 Unspecified osteoarthritis, unspecified site: Secondary | ICD-10-CM | POA: Diagnosis not present

## 2021-04-06 DIAGNOSIS — I10 Essential (primary) hypertension: Secondary | ICD-10-CM | POA: Diagnosis not present

## 2021-04-06 DIAGNOSIS — R42 Dizziness and giddiness: Secondary | ICD-10-CM | POA: Diagnosis not present

## 2021-04-06 DIAGNOSIS — R569 Unspecified convulsions: Secondary | ICD-10-CM | POA: Diagnosis not present

## 2021-04-06 DIAGNOSIS — E039 Hypothyroidism, unspecified: Secondary | ICD-10-CM | POA: Diagnosis not present

## 2021-04-06 DIAGNOSIS — R413 Other amnesia: Secondary | ICD-10-CM | POA: Diagnosis not present

## 2021-04-20 DIAGNOSIS — M6281 Muscle weakness (generalized): Secondary | ICD-10-CM | POA: Diagnosis not present

## 2021-04-20 DIAGNOSIS — R278 Other lack of coordination: Secondary | ICD-10-CM | POA: Diagnosis not present

## 2021-04-20 DIAGNOSIS — R42 Dizziness and giddiness: Secondary | ICD-10-CM | POA: Diagnosis not present

## 2021-04-26 DIAGNOSIS — J449 Chronic obstructive pulmonary disease, unspecified: Secondary | ICD-10-CM | POA: Diagnosis not present

## 2021-04-26 DIAGNOSIS — R0600 Dyspnea, unspecified: Secondary | ICD-10-CM | POA: Diagnosis not present

## 2021-04-26 DIAGNOSIS — R0602 Shortness of breath: Secondary | ICD-10-CM | POA: Diagnosis not present

## 2021-04-26 DIAGNOSIS — R0609 Other forms of dyspnea: Secondary | ICD-10-CM | POA: Diagnosis not present

## 2021-04-26 DIAGNOSIS — H353211 Exudative age-related macular degeneration, right eye, with active choroidal neovascularization: Secondary | ICD-10-CM | POA: Diagnosis not present

## 2021-05-05 DIAGNOSIS — H16223 Keratoconjunctivitis sicca, not specified as Sjogren's, bilateral: Secondary | ICD-10-CM | POA: Diagnosis not present

## 2021-05-31 DIAGNOSIS — H353211 Exudative age-related macular degeneration, right eye, with active choroidal neovascularization: Secondary | ICD-10-CM | POA: Diagnosis not present

## 2021-06-11 ENCOUNTER — Encounter: Payer: Self-pay | Admitting: General Surgery

## 2021-06-13 DIAGNOSIS — Z20822 Contact with and (suspected) exposure to covid-19: Secondary | ICD-10-CM | POA: Diagnosis not present

## 2021-06-22 DIAGNOSIS — M6281 Muscle weakness (generalized): Secondary | ICD-10-CM | POA: Diagnosis not present

## 2021-06-22 DIAGNOSIS — R278 Other lack of coordination: Secondary | ICD-10-CM | POA: Diagnosis not present

## 2021-06-22 DIAGNOSIS — R42 Dizziness and giddiness: Secondary | ICD-10-CM | POA: Diagnosis not present

## 2021-06-24 DIAGNOSIS — R42 Dizziness and giddiness: Secondary | ICD-10-CM | POA: Diagnosis not present

## 2021-06-24 DIAGNOSIS — M6281 Muscle weakness (generalized): Secondary | ICD-10-CM | POA: Diagnosis not present

## 2021-06-24 DIAGNOSIS — R278 Other lack of coordination: Secondary | ICD-10-CM | POA: Diagnosis not present

## 2021-07-05 DIAGNOSIS — H353211 Exudative age-related macular degeneration, right eye, with active choroidal neovascularization: Secondary | ICD-10-CM | POA: Diagnosis not present

## 2021-07-27 DIAGNOSIS — H16223 Keratoconjunctivitis sicca, not specified as Sjogren's, bilateral: Secondary | ICD-10-CM | POA: Diagnosis not present

## 2021-08-02 DIAGNOSIS — H353211 Exudative age-related macular degeneration, right eye, with active choroidal neovascularization: Secondary | ICD-10-CM | POA: Diagnosis not present

## 2021-09-13 DIAGNOSIS — H353211 Exudative age-related macular degeneration, right eye, with active choroidal neovascularization: Secondary | ICD-10-CM | POA: Diagnosis not present

## 2021-09-17 DIAGNOSIS — H8110 Benign paroxysmal vertigo, unspecified ear: Secondary | ICD-10-CM | POA: Diagnosis not present

## 2021-09-17 DIAGNOSIS — M316 Other giant cell arteritis: Secondary | ICD-10-CM | POA: Diagnosis not present

## 2021-09-17 DIAGNOSIS — G5603 Carpal tunnel syndrome, bilateral upper limbs: Secondary | ICD-10-CM | POA: Diagnosis not present

## 2021-09-17 DIAGNOSIS — M65341 Trigger finger, right ring finger: Secondary | ICD-10-CM | POA: Diagnosis not present

## 2021-09-28 ENCOUNTER — Ambulatory Visit: Payer: PPO | Admitting: Dermatology

## 2021-09-28 ENCOUNTER — Other Ambulatory Visit: Payer: Self-pay

## 2021-09-28 DIAGNOSIS — L820 Inflamed seborrheic keratosis: Secondary | ICD-10-CM

## 2021-09-28 DIAGNOSIS — D1800 Hemangioma unspecified site: Secondary | ICD-10-CM | POA: Diagnosis not present

## 2021-09-28 DIAGNOSIS — L578 Other skin changes due to chronic exposure to nonionizing radiation: Secondary | ICD-10-CM | POA: Diagnosis not present

## 2021-09-28 DIAGNOSIS — L814 Other melanin hyperpigmentation: Secondary | ICD-10-CM | POA: Diagnosis not present

## 2021-09-28 DIAGNOSIS — Z85828 Personal history of other malignant neoplasm of skin: Secondary | ICD-10-CM

## 2021-09-28 NOTE — Progress Notes (Signed)
Follow-Up Visit   Subjective  Mary Barker is a 88 y.o. female who presents for the following: Follow-up (Patient here today for 6 month ISK vs hypertrophic AK follow up. ISK's at left anterior ankle and right temporal hairline were treated with LN2 at last visit. Patient deferred treating others at right shoulder, right upper back, left upper back, mid sternum, left forearm, left hand dorsum and right hand dorsum. She is not aware of any new or changing spots. ).   The following portions of the chart were reviewed this encounter and updated as appropriate:       Review of Systems:  No other skin or systemic complaints except as noted in HPI or Assessment and Plan.  Objective  Well appearing patient in no apparent distress; mood and affect are within normal limits.  A focused examination was performed including back, arms, hands, face, legs. Relevant physical exam findings are noted in the Assessment and Plan.  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 She wants to minimize freezing spots as much as possible.  Will recheck on follow up. Advised to RTC if any spots are growing/changing rapidly  Destruction of lesion - left spinal upper back x 1, right spinal upper back x 1, mid sternum x 1, left medial eyebrow x 1  Destruction method: cryotherapy   Informed consent: discussed and consent obtained   Lesion destroyed using liquid nitrogen: Yes   Region frozen until ice ball extended beyond lesion: Yes   Outcome: patient tolerated procedure well with no complications   Post-procedure details: wound care instructions given   Additional details:  Prior to procedure,  discussed risks of blister formation, small wound, skin dyspigmentation, or rare scar following cryotherapy. Recommend Vaseline ointment to treated areas while healing.   History of Basal Cell Carcinoma of the Skin - No evidence of recurrence today - Recommend regular full body skin exams - Recommend daily broad spectrum sunscreen SPF 30+ to sun-exposed areas, reapply every 2 hours as needed.  - Call if any new or changing lesions are noted between office visits  History of Squamous Cell Carcinoma of the Skin - No evidence of recurrence today - Recommend regular full body skin exams - Recommend daily broad spectrum sunscreen SPF 30+ to sun-exposed areas, reapply every 2 hours as needed.  - Call if any new or changing lesions are noted between office visits  Lentigines - Scattered tan macules - Due to sun exposure - Benign-appering, observe - Recommend daily broad spectrum sunscreen SPF 30+ to sun-exposed areas, reapply every 2 hours as needed. - Call for any changes  Hemangiomas - Red papules - Discussed benign nature - Observe - Call for any changes  Actinic Damage - chronic, secondary to cumulative UV radiation exposure/sun exposure over time - diffuse scaly erythematous macules with underlying dyspigmentation - Recommend daily broad spectrum sunscreen SPF 30+ to sun-exposed areas, reapply every 2 hours as needed.  - Recommend staying in the shade or wearing long sleeves, sun glasses (UVA+UVB protection) and wide brim hats (4-inch brim around the entire circumference of the hat). - Call for new  or changing lesions.  Return in about 1 year (around 09/28/2022) for TBSE.  Graciella Belton, RMA, am acting as scribe for Brendolyn Patty, MD .  Documentation: I have reviewed the above documentation for accuracy and completeness, and I agree with the above.  Brendolyn Patty MD

## 2021-09-28 NOTE — Patient Instructions (Addendum)
Cryotherapy Aftercare  Wash gently with soap and water everyday.   Apply Vaseline and Band-Aid daily until healed.    Melanoma ABCDEs  Melanoma is the most dangerous type of skin cancer, and is the leading cause of death from skin disease.  You are more likely to develop melanoma if you: Have light-colored skin, light-colored eyes, or red or blond hair Spend a lot of time in the sun Tan regularly, either outdoors or in a tanning bed Have had blistering sunburns, especially during childhood Have a close family member who has had a melanoma Have atypical moles or large birthmarks  Early detection of melanoma is key since treatment is typically straightforward and cure rates are extremely high if we catch it early.   The first sign of melanoma is often a change in a mole or a new dark spot.  The ABCDE system is a way of remembering the signs of melanoma.  A for asymmetry:  The two halves do not match. B for border:  The edges of the growth are irregular. C for color:  A mixture of colors are present instead of an even brown color. D for diameter:  Melanomas are usually (but not always) greater than 78mm - the size of a pencil eraser. E for evolution:  The spot keeps changing in size, shape, and color.  Please check your skin once per month between visits. You can use a small mirror in front and a large mirror behind you to keep an eye on the back side or your body.   If you see any new or changing lesions before your next follow-up, please call to schedule a visit.  Please continue daily skin protection including broad spectrum sunscreen SPF 30+ to sun-exposed areas, reapplying every 2 hours as needed when you're outdoors.    If You Need Anything After Your Visit  If you have any questions or concerns for your doctor, please call our main line at [phone] and press option 4 to reach your doctor's medical assistant. If no one answers, please leave a voicemail as directed and we will  return your call as soon as possible. Messages left after 4 pm will be answered the following business day.   You may also send [place] a message via [place]. We typically respond to MyChart messages within 1-2 business days.  For prescription refills, please ask your pharmacy to contact our office. Our fax number is [phone].  If you have an urgent issue when the clinic is closed that cannot wait until the next business day, you can page your doctor at the number below.    Please note that while we do our best to be available for urgent issues outside of office hours, we are not available 24/7.   If you have an urgent issue and are unable to reach [place], you may choose to seek medical care at your doctor's office, retail clinic, urgent care center, or emergency room.  If you have a medical emergency, please immediately call 911 or go to the emergency department.  Pager Numbers  - Dr. Nehemiah Massed: [phone]  - Dr. Laurence Ferrari: [phone]  - Dr. Nicole Kindred: [phone]  In the event of inclement weather, please call our main line at [phone] for an update on the status of any delays or closures.  Dermatology Medication Tips: Please keep the boxes that topical medications come in in order to help keep track of the instructions about where and how to use these. Pharmacies typically print the medication  instructions only on the boxes and not directly on the medication tubes.   If your medication is too expensive, please contact our office at [phone] option 4 or send [place] a message through [place].   We are unable to tell what your co-pay for medications will be in advance as this is different depending on your insurance coverage. However, we may be able to find a substitute medication at lower cost or fill out paperwork to get insurance to cover a needed medication.   If a prior authorization is required to get your medication covered by your insurance company, please allow [place] 1-2 business  days to complete this process.  Drug prices often vary depending on where the prescription is filled and some pharmacies may offer cheaper prices.  The website www.goodrx.com contains coupons for medications through different pharmacies. The prices here do not account for what the cost may be with help from insurance (it may be cheaper with your insurance), but the website can give you the price if you did not use any insurance.  - You can print the associated coupon and take it with your prescription to the pharmacy.  - You may also stop by our office during regular business hours and pick up a GoodRx coupon card.  - If you need your prescription sent electronically to a different pharmacy, notify our office through [facility] or by phone at [phone] option 4.     Si Usted Necesita Algo Despus de Su Visita  Tambin puede enviarnos un mensaje a travs de Pharmacist, community. Por lo general respondemos a los mensajes de MyChart en el transcurso de 1 a 2 das hbiles.  Para renovar recetas, por favor pida a su farmacia que se ponga en contacto con nuestra oficina. Harland Dingwall de fax es [place] [phone].  Si tiene un asunto urgente cuando la clnica est cerrada y que no puede esperar hasta el siguiente da hbil, puede llamar/localizar a su doctor(a) al nmero que aparece a continuacin.   Por favor, tenga en cuenta que aunque hacemos todo lo posible para estar disponibles para asuntos urgentes fuera del horario de [place], no estamos disponibles las 24 horas del da, los 7 das de la [place].   Si tiene un problema urgente y no puede comunicarse con nosotros, puede optar por buscar atencin mdica  en el consultorio de su doctor(a), en una clnica privada, en un centro de atencin urgente o en una sala de emergencias.  Si tiene Engineering geologist, por favor llame inmediatamente al 911 o vaya a la sala de emergencias.  Nmeros de bper  - Dr. Nehemiah Massed: [phone]  - Dra. Moye:  [phone]  - Dra. Nicole Kindred: [phone]  En caso de inclemencias del [place], por favor llame a Johnsie Kindred principal al [phone] para una actualizacin sobre el [place] de cualquier retraso o cierre.  Consejos para la medicacin en dermatologa: Por favor, guarde las cajas en las que vienen los medicamentos de uso tpico para ayudarle a seguir las instrucciones sobre dnde y cmo usarlos. Las farmacias generalmente imprimen las instrucciones del medicamento slo en las cajas y no directamente en los tubos del [place].   Si su medicamento es muy caro, por favor, pngase en contacto con Zigmund Daniel llamando al [phone] y presione la opcin 4 o envenos un mensaje a travs de Pharmacist, community.   No podemos decirle cul ser su copago por los medicamentos por adelantado ya que esto es diferente dependiendo de la cobertura de su seguro. Sin embargo,  es posible que podamos encontrar un medicamento sustituto a Electrical engineer un formulario para que el seguro cubra el medicamento que se considera necesario.   Si se requiere una autorizacin previa para que su compaa de seguros [place] su medicamento, por favor permtanos de 1 a 2 das hbiles para completar este proceso.  Los precios de los medicamentos varan con frecuencia dependiendo del Environmental consultant de dnde se surte la receta y alguna farmacias pueden ofrecer precios ms baratos.  El sitio web www.goodrx.com tiene cupones para medicamentos de Airline pilot. Los precios aqu no tienen en cuenta lo que podra costar con la ayuda del seguro (puede ser ms barato con su seguro), pero el sitio web puede darle el precio si no utiliz Research scientist (physical sciences).  - Puede imprimir el cupn correspondiente y llevarlo con su receta a la farmacia.  - Tambin puede pasar por nuestra oficina durante el horario de atencin regular y Charity fundraiser una tarjeta de cupones de GoodRx.  - Si necesita que su receta se enve electrnicamente a una farmacia diferente,  informe a nuestra oficina a travs de MyChart de [place] o por telfono llamando al [phone] y presione la opcin 4.

## 2021-09-30 DIAGNOSIS — E039 Hypothyroidism, unspecified: Secondary | ICD-10-CM | POA: Diagnosis not present

## 2021-09-30 DIAGNOSIS — I10 Essential (primary) hypertension: Secondary | ICD-10-CM | POA: Diagnosis not present

## 2021-10-07 DIAGNOSIS — R42 Dizziness and giddiness: Secondary | ICD-10-CM | POA: Diagnosis not present

## 2021-10-07 DIAGNOSIS — M25561 Pain in right knee: Secondary | ICD-10-CM | POA: Diagnosis not present

## 2021-10-07 DIAGNOSIS — Z0000 Encounter for general adult medical examination without abnormal findings: Secondary | ICD-10-CM | POA: Diagnosis not present

## 2021-10-07 DIAGNOSIS — I10 Essential (primary) hypertension: Secondary | ICD-10-CM | POA: Diagnosis not present

## 2021-10-07 DIAGNOSIS — M25562 Pain in left knee: Secondary | ICD-10-CM | POA: Diagnosis not present

## 2021-10-07 DIAGNOSIS — G8929 Other chronic pain: Secondary | ICD-10-CM | POA: Diagnosis not present

## 2021-10-07 DIAGNOSIS — E039 Hypothyroidism, unspecified: Secondary | ICD-10-CM | POA: Diagnosis not present

## 2021-10-07 DIAGNOSIS — J449 Chronic obstructive pulmonary disease, unspecified: Secondary | ICD-10-CM | POA: Diagnosis not present

## 2021-10-07 DIAGNOSIS — M19049 Primary osteoarthritis, unspecified hand: Secondary | ICD-10-CM | POA: Diagnosis not present

## 2021-10-07 DIAGNOSIS — M810 Age-related osteoporosis without current pathological fracture: Secondary | ICD-10-CM | POA: Diagnosis not present

## 2021-10-18 DIAGNOSIS — H353211 Exudative age-related macular degeneration, right eye, with active choroidal neovascularization: Secondary | ICD-10-CM | POA: Diagnosis not present

## 2021-10-25 ENCOUNTER — Other Ambulatory Visit: Payer: Self-pay | Admitting: Family Medicine

## 2021-10-25 DIAGNOSIS — Z1231 Encounter for screening mammogram for malignant neoplasm of breast: Secondary | ICD-10-CM

## 2021-10-27 ENCOUNTER — Other Ambulatory Visit: Payer: Self-pay

## 2021-10-27 ENCOUNTER — Ambulatory Visit
Admission: RE | Admit: 2021-10-27 | Discharge: 2021-10-27 | Disposition: A | Discharge status: Home / Self Care | Payer: PPO | Source: Ambulatory Visit | Attending: Family Medicine | Admitting: Family Medicine

## 2021-10-27 DIAGNOSIS — Z1231 Encounter for screening mammogram for malignant neoplasm of breast: Secondary | ICD-10-CM

## 2021-11-01 DIAGNOSIS — G5603 Carpal tunnel syndrome, bilateral upper limbs: Secondary | ICD-10-CM | POA: Diagnosis not present

## 2021-11-18 DIAGNOSIS — M9901 Segmental and somatic dysfunction of cervical region: Secondary | ICD-10-CM | POA: Diagnosis not present

## 2021-11-18 DIAGNOSIS — M9903 Segmental and somatic dysfunction of lumbar region: Secondary | ICD-10-CM | POA: Diagnosis not present

## 2021-11-18 DIAGNOSIS — M4306 Spondylolysis, lumbar region: Secondary | ICD-10-CM | POA: Diagnosis not present

## 2021-11-18 DIAGNOSIS — M62830 Muscle spasm of back: Secondary | ICD-10-CM | POA: Diagnosis not present

## 2021-11-22 DIAGNOSIS — M4306 Spondylolysis, lumbar region: Secondary | ICD-10-CM | POA: Diagnosis not present

## 2021-11-22 DIAGNOSIS — M9901 Segmental and somatic dysfunction of cervical region: Secondary | ICD-10-CM | POA: Diagnosis not present

## 2021-11-22 DIAGNOSIS — M9903 Segmental and somatic dysfunction of lumbar region: Secondary | ICD-10-CM | POA: Diagnosis not present

## 2021-11-22 DIAGNOSIS — M62830 Muscle spasm of back: Secondary | ICD-10-CM | POA: Diagnosis not present

## 2021-11-23 DIAGNOSIS — H353211 Exudative age-related macular degeneration, right eye, with active choroidal neovascularization: Secondary | ICD-10-CM | POA: Diagnosis not present

## 2021-11-24 DIAGNOSIS — M9903 Segmental and somatic dysfunction of lumbar region: Secondary | ICD-10-CM | POA: Diagnosis not present

## 2021-11-24 DIAGNOSIS — M4306 Spondylolysis, lumbar region: Secondary | ICD-10-CM | POA: Diagnosis not present

## 2021-11-24 DIAGNOSIS — M9901 Segmental and somatic dysfunction of cervical region: Secondary | ICD-10-CM | POA: Diagnosis not present

## 2021-11-24 DIAGNOSIS — M62830 Muscle spasm of back: Secondary | ICD-10-CM | POA: Diagnosis not present

## 2021-12-22 DIAGNOSIS — R1032 Left lower quadrant pain: Secondary | ICD-10-CM | POA: Diagnosis not present

## 2021-12-22 DIAGNOSIS — N390 Urinary tract infection, site not specified: Secondary | ICD-10-CM | POA: Diagnosis not present

## 2021-12-22 DIAGNOSIS — R195 Other fecal abnormalities: Secondary | ICD-10-CM | POA: Diagnosis not present

## 2021-12-22 DIAGNOSIS — R1031 Right lower quadrant pain: Secondary | ICD-10-CM | POA: Diagnosis not present

## 2021-12-22 DIAGNOSIS — R102 Pelvic and perineal pain: Secondary | ICD-10-CM | POA: Diagnosis not present

## 2021-12-22 DIAGNOSIS — R109 Unspecified abdominal pain: Secondary | ICD-10-CM | POA: Diagnosis not present

## 2021-12-28 DIAGNOSIS — H353211 Exudative age-related macular degeneration, right eye, with active choroidal neovascularization: Secondary | ICD-10-CM | POA: Diagnosis not present

## 2022-01-12 DIAGNOSIS — M1711 Unilateral primary osteoarthritis, right knee: Secondary | ICD-10-CM | POA: Diagnosis not present

## 2022-01-12 DIAGNOSIS — M25511 Pain in right shoulder: Secondary | ICD-10-CM | POA: Diagnosis not present

## 2022-01-12 DIAGNOSIS — G8929 Other chronic pain: Secondary | ICD-10-CM | POA: Diagnosis not present

## 2022-01-12 DIAGNOSIS — M19011 Primary osteoarthritis, right shoulder: Secondary | ICD-10-CM | POA: Diagnosis not present

## 2022-01-12 DIAGNOSIS — M25561 Pain in right knee: Secondary | ICD-10-CM | POA: Diagnosis not present

## 2022-01-25 DIAGNOSIS — H353221 Exudative age-related macular degeneration, left eye, with active choroidal neovascularization: Secondary | ICD-10-CM | POA: Diagnosis not present

## 2022-02-01 DIAGNOSIS — H353211 Exudative age-related macular degeneration, right eye, with active choroidal neovascularization: Secondary | ICD-10-CM | POA: Diagnosis not present

## 2022-02-10 DIAGNOSIS — H353211 Exudative age-related macular degeneration, right eye, with active choroidal neovascularization: Secondary | ICD-10-CM | POA: Diagnosis not present

## 2022-02-15 DIAGNOSIS — M25552 Pain in left hip: Secondary | ICD-10-CM | POA: Diagnosis not present

## 2022-02-15 DIAGNOSIS — M1712 Unilateral primary osteoarthritis, left knee: Secondary | ICD-10-CM | POA: Diagnosis not present

## 2022-02-21 DIAGNOSIS — M170 Bilateral primary osteoarthritis of knee: Secondary | ICD-10-CM | POA: Diagnosis not present

## 2022-03-02 DIAGNOSIS — M170 Bilateral primary osteoarthritis of knee: Secondary | ICD-10-CM | POA: Diagnosis not present

## 2022-03-09 DIAGNOSIS — M170 Bilateral primary osteoarthritis of knee: Secondary | ICD-10-CM | POA: Diagnosis not present

## 2022-03-15 DIAGNOSIS — H353211 Exudative age-related macular degeneration, right eye, with active choroidal neovascularization: Secondary | ICD-10-CM | POA: Diagnosis not present

## 2022-03-22 DIAGNOSIS — L298 Other pruritus: Secondary | ICD-10-CM | POA: Diagnosis not present

## 2022-03-24 DIAGNOSIS — S61012A Laceration without foreign body of left thumb without damage to nail, initial encounter: Secondary | ICD-10-CM | POA: Diagnosis not present

## 2022-03-24 DIAGNOSIS — W101XXA Fall (on)(from) sidewalk curb, initial encounter: Secondary | ICD-10-CM | POA: Diagnosis not present

## 2022-03-31 DIAGNOSIS — E039 Hypothyroidism, unspecified: Secondary | ICD-10-CM | POA: Diagnosis not present

## 2022-04-04 DIAGNOSIS — G5603 Carpal tunnel syndrome, bilateral upper limbs: Secondary | ICD-10-CM | POA: Diagnosis not present

## 2022-04-07 DIAGNOSIS — M1990 Unspecified osteoarthritis, unspecified site: Secondary | ICD-10-CM | POA: Diagnosis not present

## 2022-04-07 DIAGNOSIS — R5381 Other malaise: Secondary | ICD-10-CM | POA: Diagnosis not present

## 2022-04-07 DIAGNOSIS — E039 Hypothyroidism, unspecified: Secondary | ICD-10-CM | POA: Diagnosis not present

## 2022-04-07 DIAGNOSIS — R7989 Other specified abnormal findings of blood chemistry: Secondary | ICD-10-CM | POA: Diagnosis not present

## 2022-04-07 DIAGNOSIS — R5383 Other fatigue: Secondary | ICD-10-CM | POA: Diagnosis not present

## 2022-04-07 DIAGNOSIS — I10 Essential (primary) hypertension: Secondary | ICD-10-CM | POA: Diagnosis not present

## 2022-04-21 DIAGNOSIS — E039 Hypothyroidism, unspecified: Secondary | ICD-10-CM | POA: Diagnosis not present

## 2022-04-21 DIAGNOSIS — R7989 Other specified abnormal findings of blood chemistry: Secondary | ICD-10-CM | POA: Diagnosis not present

## 2022-05-03 DIAGNOSIS — H353211 Exudative age-related macular degeneration, right eye, with active choroidal neovascularization: Secondary | ICD-10-CM | POA: Diagnosis not present

## 2022-05-25 DIAGNOSIS — I10 Essential (primary) hypertension: Secondary | ICD-10-CM | POA: Diagnosis not present

## 2022-05-25 DIAGNOSIS — E236 Other disorders of pituitary gland: Secondary | ICD-10-CM | POA: Diagnosis not present

## 2022-05-25 DIAGNOSIS — M810 Age-related osteoporosis without current pathological fracture: Secondary | ICD-10-CM | POA: Diagnosis not present

## 2022-05-25 DIAGNOSIS — E039 Hypothyroidism, unspecified: Secondary | ICD-10-CM | POA: Diagnosis not present

## 2022-06-06 DIAGNOSIS — E039 Hypothyroidism, unspecified: Secondary | ICD-10-CM | POA: Diagnosis not present

## 2022-06-06 DIAGNOSIS — I10 Essential (primary) hypertension: Secondary | ICD-10-CM | POA: Diagnosis not present

## 2022-06-06 DIAGNOSIS — E063 Autoimmune thyroiditis: Secondary | ICD-10-CM | POA: Diagnosis not present

## 2022-06-06 DIAGNOSIS — E061 Subacute thyroiditis: Secondary | ICD-10-CM | POA: Diagnosis not present

## 2022-06-06 DIAGNOSIS — E236 Other disorders of pituitary gland: Secondary | ICD-10-CM | POA: Diagnosis not present

## 2022-06-06 DIAGNOSIS — M810 Age-related osteoporosis without current pathological fracture: Secondary | ICD-10-CM | POA: Diagnosis not present

## 2022-06-13 DIAGNOSIS — H353211 Exudative age-related macular degeneration, right eye, with active choroidal neovascularization: Secondary | ICD-10-CM | POA: Diagnosis not present

## 2022-07-18 DIAGNOSIS — H353211 Exudative age-related macular degeneration, right eye, with active choroidal neovascularization: Secondary | ICD-10-CM | POA: Diagnosis not present

## 2022-07-21 DIAGNOSIS — E039 Hypothyroidism, unspecified: Secondary | ICD-10-CM | POA: Diagnosis not present

## 2022-07-25 DIAGNOSIS — H16223 Keratoconjunctivitis sicca, not specified as Sjogren's, bilateral: Secondary | ICD-10-CM | POA: Diagnosis not present

## 2022-07-25 DIAGNOSIS — H353211 Exudative age-related macular degeneration, right eye, with active choroidal neovascularization: Secondary | ICD-10-CM | POA: Diagnosis not present

## 2022-08-01 DIAGNOSIS — E063 Autoimmune thyroiditis: Secondary | ICD-10-CM | POA: Diagnosis not present

## 2022-08-01 DIAGNOSIS — E061 Subacute thyroiditis: Secondary | ICD-10-CM | POA: Diagnosis not present

## 2022-08-01 DIAGNOSIS — E039 Hypothyroidism, unspecified: Secondary | ICD-10-CM | POA: Diagnosis not present

## 2022-08-01 DIAGNOSIS — I10 Essential (primary) hypertension: Secondary | ICD-10-CM | POA: Diagnosis not present

## 2022-08-01 DIAGNOSIS — E236 Other disorders of pituitary gland: Secondary | ICD-10-CM | POA: Diagnosis not present

## 2022-08-08 DIAGNOSIS — E039 Hypothyroidism, unspecified: Secondary | ICD-10-CM | POA: Diagnosis not present

## 2022-08-08 DIAGNOSIS — M810 Age-related osteoporosis without current pathological fracture: Secondary | ICD-10-CM | POA: Diagnosis not present

## 2022-08-08 DIAGNOSIS — E063 Autoimmune thyroiditis: Secondary | ICD-10-CM | POA: Diagnosis not present

## 2022-08-08 DIAGNOSIS — E061 Subacute thyroiditis: Secondary | ICD-10-CM | POA: Diagnosis not present

## 2022-08-08 DIAGNOSIS — E236 Other disorders of pituitary gland: Secondary | ICD-10-CM | POA: Diagnosis not present

## 2022-08-08 DIAGNOSIS — I10 Essential (primary) hypertension: Secondary | ICD-10-CM | POA: Diagnosis not present

## 2022-08-15 DIAGNOSIS — H353211 Exudative age-related macular degeneration, right eye, with active choroidal neovascularization: Secondary | ICD-10-CM | POA: Diagnosis not present

## 2022-08-16 DIAGNOSIS — R0981 Nasal congestion: Secondary | ICD-10-CM | POA: Diagnosis not present

## 2022-08-22 DIAGNOSIS — H43391 Other vitreous opacities, right eye: Secondary | ICD-10-CM | POA: Diagnosis not present

## 2022-08-25 DIAGNOSIS — G5603 Carpal tunnel syndrome, bilateral upper limbs: Secondary | ICD-10-CM | POA: Diagnosis not present

## 2022-09-12 ENCOUNTER — Ambulatory Visit: Payer: PPO | Admitting: Dermatology

## 2022-09-12 VITALS — BP 153/85 | HR 76

## 2022-09-12 DIAGNOSIS — C4441 Basal cell carcinoma of skin of scalp and neck: Secondary | ICD-10-CM

## 2022-09-12 DIAGNOSIS — L300 Nummular dermatitis: Secondary | ICD-10-CM

## 2022-09-12 DIAGNOSIS — D492 Neoplasm of unspecified behavior of bone, soft tissue, and skin: Secondary | ICD-10-CM

## 2022-09-12 DIAGNOSIS — L570 Actinic keratosis: Secondary | ICD-10-CM | POA: Diagnosis not present

## 2022-09-12 DIAGNOSIS — L821 Other seborrheic keratosis: Secondary | ICD-10-CM

## 2022-09-12 DIAGNOSIS — L578 Other skin changes due to chronic exposure to nonionizing radiation: Secondary | ICD-10-CM | POA: Diagnosis not present

## 2022-09-12 MED ORDER — CLOBETASOL PROPIONATE 0.05 % EX SOLN: 1.0000 | CUTANEOUS | 1 refills | Status: DC

## 2022-09-12 NOTE — Patient Instructions (Addendum)
Eczema Skin Care  Buy TWO 16oz jars of CeraVe moisturizing cream  [facility], [facility], [facility] (no prescription needed)  Costs about $15 per jar   Jar #1: Use as a moisturizer as needed. Can be applied to any area of the body. Use twice daily to unaffected areas.  Jar #2: Pour one 36m bottle of clobetasol 0.05% solution into jar, mix well. Label this jar to indicate the medication has been added. Use twice daily to affected areas. Do not apply to face, groin or underarms.  Moisturizer may burn or sting initially. Try for at least 4 weeks.       Gentle Skin Care Guide  1. Bathe no more than once a day.  2. Avoid bathing in hot water  3. Use a mild soap like Dove, Vanicream, Cetaphil, CeraVe. Can use Lever 2000 or Cetaphil antibacterial soap  4. Use soap only where you need it. On most days, use it under your arms, between your legs, and on your feet. Let the water rinse other areas unless visibly dirty.  5. When you get out of the bath/shower, use a towel to gently blot your skin dry, don't rub it.  6. While your skin is still a little damp, apply a moisturizing cream such as Vanicream, CeraVe, Cetaphil, Eucerin, Sarna lotion or plain Vaseline Jelly. For hands apply Neutrogena [place]Hand Cream or Excipial Hand Cream.  7. Reapply moisturizer any time you start to itch or feel dry.  8. Sometimes using free and clear laundry detergents can be helpful. Fabric softener sheets should be avoided. Downy Free & Gentle liquid, or any liquid fabric softener that is free of dyes and perfumes, it acceptable to use  9. If your doctor has given you prescription creams you may apply moisturizers over them   Recommend OTC [place] Rapid Relief Anti-Itch cream (pramoxine + menthol), CeraVe Anti-itch cream or lotion (pramoxine), Sarna lotion (Original- menthol + camphor or Sensitive- pramoxine) or Eucerin 12 hour Itch Relief lotion (menthol) up to 3 times per day to areas on body that are itchy.     Cryotherapy Aftercare  Wash gently with soap and water everyday.   Apply Vaseline and Band-Aid daily until healed.    Wound Care Instructions  Cleanse wound gently with soap and water once a day then pat dry with clean gauze. Apply a thin coat of Petrolatum (petroleum jelly, "Vaseline") over the wound (unless you have an allergy to this). We recommend that you use a new, sterile tube of Vaseline. Do not pick or remove scabs. Do not remove the yellow or white "healing tissue" from the base of the wound.  Cover the wound with fresh, clean, nonstick gauze and secure with paper tape. You may use Band-Aids in place of gauze and tape if the wound is small enough, but would recommend trimming much of the tape off as there is often too much. Sometimes Band-Aids can irritate the skin.  You should call the office for your biopsy report after 1 week if you have not already been contacted.  If you experience any problems, such as abnormal amounts of bleeding, swelling, significant bruising, significant pain, or evidence of infection, please call the office immediately.  FOR ADULT SURGERY PATIENTS: If you need something for pain relief you may take 1 extra strength Tylenol (acetaminophen) AND 2 Ibuprofen ('200mg'$  each) together every 4 hours as needed for pain. (do not take these if you are allergic to them or if you have a reason you should  not take them.) Typically, you may only need pain medication for 1 to 3 days.      Due to recent changes in healthcare laws, you may see results of your pathology and/or laboratory studies on MyChart before the doctors have had a chance to review them. We understand that in some cases there may be results that are confusing or concerning to you. Please understand that not all results are received at the same time and often the doctors may need to interpret multiple results in order to provide you with the best plan of care or course of treatment. Therefore, we ask  that you please give [place] 2 business days to thoroughly review all your results before contacting the office for clarification. Should we see a critical lab result, you will be contacted sooner.   If You Need Anything After Your Visit  If you have any questions or concerns for your doctor, please call our main line at [phone] and press option 4 to reach your doctor's medical assistant. If no one answers, please leave a voicemail as directed and we will return your call as soon as possible. Messages left after 4 pm will be answered the following business day.   You may also send [place] a message via [place]. We typically respond to MyChart messages within 1-2 business days.  For prescription refills, please ask your pharmacy to contact our office. Our fax number is [phone].  If you have an urgent issue when the clinic is closed that cannot wait until the next business day, you can page your doctor at the number below.    Please note that while we do our best to be available for urgent issues outside of office hours, we are not available 24/7.   If you have an urgent issue and are unable to reach [place], you may choose to seek medical care at your doctor's office, retail clinic, urgent care center, or emergency room.  If you have a medical emergency, please immediately call 911 or go to the emergency department.  Pager Numbers  - Dr. Nehemiah Massed: [phone]  - Dr. Laurence Ferrari: [phone]  - Dr. Nicole Kindred: [phone]  In the event of inclement weather, please call our main line at [phone] for an update on the status of any delays or closures.  Dermatology Medication Tips: Please keep the boxes that topical medications come in in order to help keep track of the instructions about where and how to use these. Pharmacies typically print the medication instructions only on the boxes and not directly on the medication tubes.   If your medication is too expensive, please contact our office at  [phone] option 4 or send [place] a message through [place].   We are unable to tell what your co-pay for medications will be in advance as this is different depending on your insurance coverage. However, we may be able to find a substitute medication at lower cost or fill out paperwork to get insurance to cover a needed medication.   If a prior authorization is required to get your medication covered by your insurance company, please allow [place] 1-2 business days to complete this process.  Drug prices often vary depending on where the prescription is filled and some pharmacies may offer cheaper prices.  The website www.goodrx.com contains coupons for medications through different pharmacies. The prices here do not account for what the cost may be with help from insurance (it may be cheaper with your insurance), but the website can give you  the price if you did not use any insurance.  - You can print the associated coupon and take it with your prescription to the pharmacy.  - You may also stop by our office during regular business hours and pick up a GoodRx coupon card.  - If you need your prescription sent electronically to a different pharmacy, notify our office through [facility] or by phone at [phone] option 4.     Si Usted Necesita Algo Despus de Su Visita  Tambin puede enviarnos un mensaje a travs de Pharmacist, community. Por lo general respondemos a los mensajes de MyChart en el transcurso de 1 a 2 das hbiles.  Para renovar recetas, por favor pida a su farmacia que se ponga en contacto con nuestra oficina. Harland Dingwall de fax es [place] [phone].  Si tiene un asunto urgente cuando la clnica est cerrada y que no puede esperar hasta el siguiente da hbil, puede llamar/localizar a su doctor(a) al nmero que aparece a continuacin.   Por favor, tenga en cuenta que aunque hacemos todo lo posible para estar disponibles para asuntos urgentes fuera del horario de [place], no estamos  disponibles las 24 horas del da, los 7 das de la [place].   Si tiene un problema urgente y no puede comunicarse con nosotros, puede optar por buscar atencin mdica  en el consultorio de su doctor(a), en una clnica privada, en un centro de atencin urgente o en una sala de emergencias.  Si tiene Engineering geologist, por favor llame inmediatamente al 911 o vaya a la sala de emergencias.  Nmeros de bper  - Dr. Nehemiah Massed: [phone]  - Dra. Moye: [phone]  - Dra. Nicole Kindred: [phone]  En caso de inclemencias del [place], por favor llame a Johnsie Kindred principal al [phone] para una actualizacin sobre el [place] de cualquier retraso o cierre.  Consejos para la medicacin en dermatologa: Por favor, guarde las cajas en las que vienen los medicamentos de uso tpico para ayudarle a seguir las instrucciones sobre dnde y cmo usarlos. Las farmacias generalmente imprimen las instrucciones del medicamento slo en las cajas y no directamente en los tubos del [place].   Si su medicamento es muy caro, por favor, pngase en contacto con Zigmund Daniel llamando al [phone] y presione la opcin 4 o envenos un mensaje a travs de Pharmacist, community.   No podemos decirle cul ser su copago por los medicamentos por adelantado ya que esto es diferente dependiendo de la cobertura de su seguro. Sin embargo, es posible que podamos encontrar un medicamento sustituto a Electrical engineer un formulario para que el seguro cubra el medicamento que se considera necesario.   Si se requiere una autorizacin previa para que su compaa de seguros [place] su medicamento, por favor permtanos de 1 a 2 das hbiles para completar este proceso.  Los precios de los medicamentos varan con frecuencia dependiendo del Environmental consultant de dnde se surte la receta y alguna farmacias pueden ofrecer precios ms baratos.  El sitio web www.goodrx.com tiene cupones para medicamentos de Airline pilot. Los precios aqu no  tienen en cuenta lo que podra costar con la ayuda del seguro (puede ser ms barato con su seguro), pero el sitio web puede darle el precio si no utiliz Research scientist (physical sciences).  - Puede imprimir el cupn correspondiente y llevarlo con su receta a la farmacia.  - Tambin puede pasar por nuestra oficina durante el horario de atencin regular y Charity fundraiser una tarjeta de cupones de GoodRx.  - Si necesita  Si necesita que su receta se enve electrnicamente a una farmacia diferente, informe a nuestra oficina a travs de MyChart de  o por telfono llamando al [phone] y presione la opcin 4.  

## 2022-09-12 NOTE — Progress Notes (Signed)
Follow-Up Visit   Subjective  Mary Barker is a 89 y.o. female who presents for the following: Pruritis (Arms, legs, > 9m over the counter lotions) and check spots (Face, neck, few months).  The patient has spots, moles and lesions to be evaluated, some may be new or changing and the patient has concerns that these could be cancer.   The following portions of the chart were reviewed this encounter and updated as appropriate:       Review of Systems:  No other skin or systemic complaints except as noted in HPI or Assessment and Plan.  Objective  Well appearing patient in no apparent distress; mood and affect are within normal limits.  A focused examination was performed including arms, legs. Relevant physical exam findings are noted in the Assessment and Plan.  bil arms, bil legs Erythematous scaly patches with severe xerosis bil forearms and lower legs  L post neck 1.3cm pearly pink pap     R neck x 2, L lower cheek x 1 (3) Pink keratotic macules    Assessment & Plan   Seborrheic Keratoses - Stuck-on, waxy, tan-brown papules and/or plaques  - Benign-appearing - Discussed benign etiology and prognosis. - Observe - Call for any changes - legs, L cheek  Actinic Damage - chronic, secondary to cumulative UV radiation exposure/sun exposure over time - diffuse scaly erythematous macules with underlying dyspigmentation - Recommend daily broad spectrum sunscreen SPF 30+ to sun-exposed areas, reapply every 2 hours as needed.  - Recommend staying in the shade or wearing long sleeves, sun glasses (UVA+UVB protection) and wide brim hats (4-inch brim around the entire circumference of the hat). - Call for new or changing lesions.   Nummular dermatitis bil arms, bil legs  Chronic and persistent condition with duration or expected duration over one year. Condition is bothersome/symptomatic for patient. Currently flared.   Atopic dermatitis (eczema) is a chronic,  relapsing, pruritic condition that can significantly affect quality of life. It is often associated with allergic rhinitis and/or asthma and can require treatment with topical medications, phototherapy, or in severe cases biologic injectable medication (Dupixent; Adbry) or Oral JAK inhibitors.   Cont Dove for sensitive skin soap Start CeraVe anti-itch cream prn Start Clobetasol/Cerave cream bid to arms and legs until clear up to 4 weeks, then prn flares, avoid f/g/a Pt instructions given  clobetasol (TEMOVATE) 0.05 % external solution - bil arms, bil legs Apply 1 Application topically as directed. Mix in 1 tub of Cerave cream, apply to arms, legs qd to bid itchy rash until clear, then prn flares, avoid face, groin, axilla  Neoplasm of skin L post neck  Epidermal / dermal shaving  Lesion diameter (cm):  1.3 Informed consent: discussed and consent obtained   Patient was prepped and draped in usual sterile fashion: area prepped with alcohol. Anesthesia: the lesion was anesthetized in a standard fashion   Anesthetic:  1% lidocaine w/ epinephrine 1-100,000 buffered w/ 8.4% NaHCO3 Instrument used: flexible razor blade   Hemostasis achieved with: pressure, aluminum chloride and electrodesiccation   Outcome: patient tolerated procedure well    Destruction of lesion  Destruction method: electrodesiccation and curettage   Informed consent: discussed and consent obtained   Curettage performed in three different directions: Yes   Electrodesiccation performed over the curetted area: Yes   Final wound size (cm):  1.4 Hemostasis achieved with:  pressure, aluminum chloride and electrodesiccation Outcome: patient tolerated procedure well with no complications   Post-procedure details: wound care  instructions given   Additional details:  Mupirocin ointment and Bandaid applied   Specimen 1 - Surgical pathology Differential Diagnosis: D48.5 R/O BCC  Check Margins: No 1.3cm pearly pink  pap EDC  Hypertrophic actinic keratosis (3) R neck x 2, L lower cheek x 1  Recheck on f/u  (HyAk vrs ISK L cheek)  Destruction of lesion - R neck x 2, L lower cheek x 1  Destruction method: cryotherapy   Informed consent: discussed and consent obtained   Lesion destroyed using liquid nitrogen: Yes   Region frozen until ice ball extended beyond lesion: Yes   Outcome: patient tolerated procedure well with no complications   Post-procedure details: wound care instructions given   Additional details:  Prior to procedure, discussed risks of blister formation, small wound, skin dyspigmentation, or rare scar following cryotherapy. Recommend Vaseline ointment to treated areas while healing.    Return in about 4 weeks (around 10/10/2022) for f/u Nummular derm, recheck AKs, .  I, Sonya Hupman, RMA, am acting as scribe for Brendolyn Patty, MD .  Documentation: I have reviewed the above documentation for accuracy and completeness, and I agree with the above.  Brendolyn Patty MD

## 2022-09-15 DIAGNOSIS — G5603 Carpal tunnel syndrome, bilateral upper limbs: Secondary | ICD-10-CM | POA: Diagnosis not present

## 2022-09-15 DIAGNOSIS — H8110 Benign paroxysmal vertigo, unspecified ear: Secondary | ICD-10-CM | POA: Diagnosis not present

## 2022-09-19 ENCOUNTER — Telehealth: Payer: Self-pay

## 2022-09-19 NOTE — Telephone Encounter (Signed)
-----  Message from Brendolyn Patty, MD sent at 09/19/2022  1:48 PM EST ----- Skin , left post neck BASAL CELL CARCINOMA, NODULAR AND INFILTRATIVE PATTERNS  BCC skin cancer- already treated with EDC at time of biopsy    - please call patient

## 2022-09-19 NOTE — Telephone Encounter (Signed)
Advised patient biopsy of the left posterior neck was BCC and was treated with EDC at the time of biopsy.

## 2022-09-26 DIAGNOSIS — H353211 Exudative age-related macular degeneration, right eye, with active choroidal neovascularization: Secondary | ICD-10-CM | POA: Diagnosis not present

## 2022-10-10 DIAGNOSIS — I10 Essential (primary) hypertension: Secondary | ICD-10-CM | POA: Diagnosis not present

## 2022-10-10 DIAGNOSIS — E039 Hypothyroidism, unspecified: Secondary | ICD-10-CM | POA: Diagnosis not present

## 2022-10-11 ENCOUNTER — Ambulatory Visit: Payer: PPO | Admitting: Dermatology

## 2022-10-11 VITALS — BP 109/65 | HR 69

## 2022-10-11 DIAGNOSIS — Z872 Personal history of diseases of the skin and subcutaneous tissue: Secondary | ICD-10-CM

## 2022-10-11 DIAGNOSIS — L821 Other seborrheic keratosis: Secondary | ICD-10-CM

## 2022-10-11 DIAGNOSIS — L820 Inflamed seborrheic keratosis: Secondary | ICD-10-CM | POA: Diagnosis not present

## 2022-10-11 DIAGNOSIS — L578 Other skin changes due to chronic exposure to nonionizing radiation: Secondary | ICD-10-CM

## 2022-10-11 DIAGNOSIS — Z85828 Personal history of other malignant neoplasm of skin: Secondary | ICD-10-CM

## 2022-10-11 DIAGNOSIS — L300 Nummular dermatitis: Secondary | ICD-10-CM

## 2022-10-11 NOTE — Patient Instructions (Addendum)
Continue (red and white tub) CeraVe anti-itch cream can use at any itchy areas on body twice daily    Nummular Eczema Nummular eczema, also called nummular dermatitis or discoid eczema, is a common skin condition that causes itchy, red, circular, crusted (plaque) lesions. The itch is severe. It most commonly affects the lower legs and the backs of the hands. Men tend to get their first outbreak between 26 and 87 years of age, and women tend to get their first outbreak during their teen or young adult years. What are the causes? The cause of this condition is not known. It may be related to skin sensitivities to certain things, such as: Metals, such as nickel and, rarely, mercury. Formaldehyde. Antibiotic medicine that is applied to the skin. What increases the risk? You are more likely to develop this condition if: You have very dry skin. You live in a place with dry and cold weather. You have a personal or family history of eczema, asthma, or allergies. You drink alcohol. You have poor blood flow (circulation). What are the signs or symptoms? Symptoms most commonly affect the lower legs but may also affect the hands, torso, arms, or feet. Symptoms include: Groups of tiny red spots. Blister-like sores that leak fluid. These sores may grow together and form circular patches. After a long time, they may become crusty and then scaly. Well-defined patches of pink, red, or brown skin. Itchiness and burning, ranging from mild to severe. Itchiness may be worse at night and may cause trouble sleeping. Scratching lesions can cause bleeding. How is this diagnosed? This condition may be diagnosed based on a physical exam and your medical history. You may need a swab test to check for skin infection. This involves swabbing an affected area and testing the sample for bacteria (culture). You may work with a health care provider who specializes in skin conditions (dermatologist). How is this  treated? There is no cure for this condition, but treatment can help relieve symptoms. Depending on how severe your symptoms are, your health care provider may suggest: Medicine applied to the skin to reduce swelling and irritation (topical corticosteroids). Medicine taken by mouth to reduce itching (oralantihistamines). Antibiotic medicine taken orally or applied to your skin (topical antibiotic), if you have a skin infection. Light therapy (phototherapy). This involves shining ultraviolet (UV) light on the affected skin to reduce itchiness and inflammation. Soaking in a bath that contains a type of salt that dries out blisters (potassium permanganate soaks). Follow these instructions at home: Medicines Take or apply over-the-counter and prescription medicines only as told by your health care provider. If you were prescribed an antibiotic, take or apply it as told by your health care provider. Do not stop using the antibiotic even if you start to feel better. Skin care  Keep your fingernails short to avoid breaking the skin if you scratch. Wash your hands with mild soap and water for at least 20 seconds to avoid infection. Pat your skin dry after bathing or washing your hands. Avoid rubbing your skin. Keep your skin hydrated. To do this: Avoid very hot water. Take lukewarm baths or showers. Apply moisturizer within 3 minutes of bathing. This locks in moisture. Use a humidifier when you have the heating or air conditioning on. This will add moisture to the air. Identify and avoid things that trigger symptoms or irritate your skin. Triggers may include taking long, hot showers or baths, or not using creams or ointments to moisturize. Certain soaps may also  trigger this condition. General instructions Dress in clothes made of cotton or cotton blends. Avoid wearing clothes with wool fabric. Avoid activities that may cause skin injury. Wear protective clothing when doing outdoor activities, such  as gardening or hiking. Cuts, scrapes, and insect bites can make symptoms worse. Keep all follow-up visits. This is important. Contact a health care provider if: You develop a yellowish crust on an area of the affected skin. You have symptoms that do not go away with treatment or home care methods. Get help right away if: You have more redness, pain, pus, or swelling. Summary Nummular eczema is a common disease that causes itchy, red, circular, crusted (plaque) lesions. The cause of this condition is not known. It may be related to certain skin sensitivities. Treatments may include taking or applying medicines to reduce swelling and irritation, avoiding triggers, and keeping your skin hydrated. This information is not intended to replace advice given to you by your health care provider. Make sure you discuss any questions you have with your health care provider. Document Revised: 06/01/2020 Document Reviewed: 06/01/2020 Elsevier Patient Education  [place]  1. Bathe no more than once a day.  2. Avoid bathing in hot water  3. Use a mild soap like Dove, Vanicream, Cetaphil, CeraVe. Can use Lever 2000 or Cetaphil antibacterial soap  4. Use soap only where you need it. On most days, use it under your arms, between your legs, and on your feet. Let the water rinse other areas unless visibly dirty.  5. When you get out of the bath/shower, use a towel to gently blot your skin dry, don't rub it.  6. While your skin is still a little damp, apply a moisturizing cream such as Vanicream, CeraVe, Cetaphil, Eucerin, Sarna lotion or plain Vaseline Jelly. For hands apply Neutrogena [place] Hand Cream or Excipial Hand Cream.  7. Reapply moisturizer any time you start to itch or feel dry.  8. Sometimes using free and clear laundry detergents can be helpful. Fabric softener sheets should be avoided. Downy Free & Gentle liquid, or any liquid fabric softener that is  free of dyes and perfumes, it acceptable to use  9. If your doctor has given you prescription creams you may apply moisturizers over them        Cryotherapy Aftercare  Wash gently with soap and water everyday.   Apply Vaseline and Band-Aid daily until healed.   Seborrheic Keratosis  What causes seborrheic keratoses? Seborrheic keratoses are harmless, common skin growths that first appear during adult life.  As time goes by, more growths appear.  Some people may develop a large number of them.  Seborrheic keratoses appear on both covered and uncovered body parts.  They are not caused by sunlight.  The tendency to develop seborrheic keratoses can be inherited.  They vary in color from skin-colored to gray, brown, or even black.  They can be either smooth or have a rough, warty surface.   Seborrheic keratoses are superficial and look as if they were stuck on the skin.  Under the microscope this type of keratosis looks like layers upon layers of skin.  That is why at times the top layer may seem to fall off, but the rest of the growth remains and re-grows.    Treatment Seborrheic keratoses do not need to be treated, but can easily be removed in the office.  Seborrheic keratoses often cause symptoms when they rub on clothing or  jewelry.  Lesions can be in the way of shaving.  If they become inflamed, they can cause itching, soreness, or burning.  Removal of a seborrheic keratosis can be accomplished by freezing, burning, or surgery. If any spot bleeds, scabs, or grows rapidly, please return to have it checked, as these can be an indication of a skin cancer.      Due to recent changes in healthcare laws, you may see results of your pathology and/or laboratory studies on MyChart before the doctors have had a chance to review them. We understand that in some cases there may be results that are confusing or concerning to you. Please understand that not all results are received at the same time and  often the doctors may need to interpret multiple results in order to provide you with the best plan of care or course of treatment. Therefore, we ask that you please give Mary Barker 2 business days to thoroughly review all your results before contacting the office for clarification. Should we see a critical lab result, you will be contacted sooner.   If You Need Anything After Your Visit  If you have any questions or concerns for your doctor, please call our main line at [phone] and press option 4 to reach your doctor's medical assistant. If no one answers, please leave a voicemail as directed and we will return your call as soon as possible. Messages left after 4 pm will be answered the following business day.   You may also send Mary Barker a message via [place]. We typically respond to MyChart messages within 1-2 business days.  For prescription refills, please ask your pharmacy to contact our office. Our fax number is [phone].  If you have an urgent issue when the clinic is closed that cannot wait until the next business day, you can page your doctor at the number below.    Please note that while we do our best to be available for urgent issues outside of office hours, we are not available 24/7.   If you have an urgent issue and are unable to reach Mary Barker, you may choose to seek medical care at your doctor's office, retail clinic, urgent care center, or emergency room.  If you have a medical emergency, please immediately call 911 or go to the emergency department.  Pager Numbers  - Dr. Nehemiah Massed: [phone]  - Dr. Laurence Ferrari: [phone]  - Dr. Nicole Kindred: [phone]  In the event of inclement weather, please call our main line at [phone] for an update on the status of any delays or closures.  Dermatology Medication Tips: Please keep the boxes that topical medications come in in order to help keep track of the instructions about where and how to use these. Pharmacies typically print the  medication instructions only on the boxes and not directly on the medication tubes.   If your medication is too expensive, please contact our office at [phone] option 4 or send Mary Barker a message through [place].   We are unable to tell what your co-pay for medications will be in advance as this is different depending on your insurance coverage. However, we may be able to find a substitute medication at lower cost or fill out paperwork to get insurance to cover a needed medication.   If a prior authorization is required to get your medication covered by your insurance company, please allow Mary Barker 1-2 business days to complete this process.  Drug prices often vary depending on where the prescription is filled and some  pharmacies may offer cheaper prices.  The website www.goodrx.com contains coupons for medications through different pharmacies. The prices here do not account for what the cost may be with help from insurance (it may be cheaper with your insurance), but the website can give you the price if you did not use any insurance.  - You can print the associated coupon and take it with your prescription to the pharmacy.  - You may also stop by our office during regular business hours and pick up a GoodRx coupon card.  - If you need your prescription sent electronically to a different pharmacy, notify our office through [facility] or by phone at [phone] option 4.     Si Usted Necesita Algo Despus de Su Visita  Tambin puede enviarnos un mensaje a travs de Pharmacist, community. Por lo general respondemos a los mensajes de MyChart en el transcurso de 1 a 2 das hbiles.  Para renovar recetas, por favor pida a su farmacia que se ponga en contacto con nuestra oficina. Harland Dingwall de fax es [place] [phone].  Si tiene un asunto urgente cuando la clnica est cerrada y que no puede esperar hasta el siguiente da hbil, puede llamar/localizar a su doctor(a) al nmero que aparece a continuacin.    Por favor, tenga en cuenta que aunque hacemos todo lo posible para estar disponibles para asuntos urgentes fuera del horario de [place], no estamos disponibles las 24 horas del da, los 7 das de la [place].   Si tiene un problema urgente y no puede comunicarse con nosotros, puede optar por buscar atencin mdica  en el consultorio de su doctor(a), en una clnica privada, en un centro de atencin urgente o en una sala de emergencias.  Si tiene Engineering geologist, por favor llame inmediatamente al 911 o vaya a la sala de emergencias.  Nmeros de bper  - Dr. Nehemiah Massed: [phone]  - Dra. Moye: [phone]  - Dra. Nicole Kindred: [phone]  En caso de inclemencias del [place], por favor llame a Johnsie Kindred principal al [phone] para una actualizacin sobre el [place] de cualquier retraso o cierre.  Consejos para la medicacin en dermatologa: Por favor, guarde las cajas en las que vienen los medicamentos de uso tpico para ayudarle a seguir las instrucciones sobre dnde y cmo usarlos. Las farmacias generalmente imprimen las instrucciones del medicamento slo en las cajas y no directamente en los tubos del [place].   Si su medicamento es muy caro, por favor, pngase en contacto con Zigmund Daniel llamando al [phone] y presione la opcin 4 o envenos un mensaje a travs de Pharmacist, community.   No podemos decirle cul ser su copago por los medicamentos por adelantado ya que esto es diferente dependiendo de la cobertura de su seguro. Sin embargo, es posible que podamos encontrar un medicamento sustituto a Electrical engineer un formulario para que el seguro cubra el medicamento que se considera necesario.   Si se requiere una autorizacin previa para que su compaa de seguros [place] su medicamento, por favor permtanos de 1 a 2 das hbiles para completar este proceso.  Los precios de los medicamentos varan con frecuencia dependiendo del Environmental consultant de dnde se surte la receta y alguna  farmacias pueden ofrecer precios ms baratos.  El sitio web www.goodrx.com tiene cupones para medicamentos de Airline pilot. Los precios aqu no tienen en cuenta lo que podra costar con la ayuda del seguro (puede ser ms barato con su seguro), pero el sitio web puede darle el precio si  no utiliz [employer].  - Puede imprimir el cupn correspondiente y llevarlo con su receta a la farmacia.  - Tambin puede pasar por nuestra oficina durante el horario de atencin regular y Charity fundraiser una tarjeta de cupones de GoodRx.  - Si necesita que su receta se enve electrnicamente a una farmacia diferente, informe a nuestra oficina a travs de MyChart de [place] o por telfono llamando al [phone] y presione la opcin 4.

## 2022-10-11 NOTE — Progress Notes (Signed)
Follow-Up Visit   Subjective  Mary Barker is a 89 y.o. female who presents for the following: Other (Patient here for follow up nummular dermatitis at legs and arms. Using clobetasol / cerave mix. Reports itch has improved at arms and legs. /Patient is also here for follow up on 2 areas treated with ln2. ).  She has itchy bumps on L side.  The patient has spots, moles and lesions to be evaluated, some may be new or changing and the patient has concerns that these could be cancer.  The following portions of the chart were reviewed this encounter and updated as appropriate:      Review of Systems: No other skin or systemic complaints except as noted in HPI or Assessment and Plan.   Objective  Well appearing patient in no apparent distress; mood and affect are within normal limits.  A focused examination was performed including face, left flank, b/l arms, b/l legs, neck, chest, back. Relevant physical exam findings are noted in the Assessment and Plan.  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 Condition is bothersome/symptomatic for patient. Much improved with topical treatment.  Recommend mild soap and moisturizing cream 1-2 times daily.  Gentle skin care handout provided.     Cont Dove for sensitive skin soap Start CeraVe anti-itch cream daily and prn itch Continue if flared Clobetasol/Cerave cream bid to arms and legs until clear up to 4 weeks, then prn flares, avoid f/g/a  Topical steroids (such as triamcinolone, fluocinolone, fluocinonide, mometasone, clobetasol, halobetasol, betamethasone, hydrocortisone) can cause thinning and lightening of the skin if they are used for too long in the same area. Your physician has selected the right strength  medicine for your problem and area affected on the body. Please use your medication only as directed by your physician to prevent side effects.     Related Medications clobetasol (TEMOVATE) 0.05 % external solution Apply 1 Application topically as directed. Mix in 1 tub of Cerave cream, apply to arms, legs qd to bid itchy rash until clear, then prn flares, avoid face, groin, axilla  Inflamed seborrheic keratosis (7) Left Flank x 6, left anticubitum x 1  Symptomatic, irritating, patient would like treated.  Destruction of lesion - Left Flank x 6, left anticubitum x 1  Destruction method: cryotherapy   Informed consent: discussed and consent obtained   Lesion destroyed using liquid nitrogen: Yes   Region frozen until ice ball extended beyond lesion: Yes   Outcome: patient tolerated procedure well with no complications   Post-procedure details: wound care instructions given   Additional details:  Prior to procedure, discussed risks of blister formation, small wound, skin dyspigmentation, or rare scar following cryotherapy. Recommend Vaseline ointment to treated areas while healing.    Seborrheic Keratoses - Stuck-on, waxy, tan-brown papules and/or plaques  - Benign-appearing - Discussed benign etiology and prognosis. - Observe - Call for any changes  Actinic Damage - chronic, secondary to cumulative UV radiation exposure/sun exposure over time - diffuse scaly erythematous macules with underlying dyspigmentation - Recommend daily broad spectrum sunscreen SPF 30+ to sun-exposed areas, reapply every 2 hours as needed.  - Recommend staying in the shade or wearing long sleeves, sun glasses (UVA+UVB protection) and wide brim hats (4-inch brim around the  entire circumference of the hat). - Call for new or changing lesions.  History of Basal Cell Carcinoma of the Skin Left posterior neck 1/8 /24 EDC - No evidence of recurrence today, still healing. - Recommend regular full body skin  exams - Recommend daily broad spectrum sunscreen SPF 30+ to sun-exposed areas, reapply every 2 hours as needed.  - Call if any new or changing lesions are noted between office visits  History of PreCancerous Actinic Keratosis  Left lower cheek, right neck x 2 - site(s) of PreCancerous Actinic Keratosis clear today. - these may recur and new lesions may form requiring treatment to prevent transformation into skin cancer - observe for new or changing spots and contact [place] for appointment if occur - photoprotection with sun protective clothing; sunglasses and broad spectrum sunscreen with SPF of at least 30 + and frequent self skin exams recommended - yearly exams by a dermatologist recommended for persons with history of PreCancerous Actinic Keratoses  Return for keep appt as scheduled 6/24. I, Ruthell Rummage, CMA, am acting as scribe for Brendolyn Patty, MD.  Documentation: I have reviewed the above documentation for accuracy and completeness, and I agree with the above.  Brendolyn Patty MD

## 2022-10-12 DIAGNOSIS — Z0000 Encounter for general adult medical examination without abnormal findings: Secondary | ICD-10-CM | POA: Diagnosis not present

## 2022-10-12 DIAGNOSIS — R0689 Other abnormalities of breathing: Secondary | ICD-10-CM | POA: Diagnosis not present

## 2022-10-12 DIAGNOSIS — J449 Chronic obstructive pulmonary disease, unspecified: Secondary | ICD-10-CM | POA: Diagnosis not present

## 2022-10-12 DIAGNOSIS — R413 Other amnesia: Secondary | ICD-10-CM | POA: Diagnosis not present

## 2022-10-12 DIAGNOSIS — I10 Essential (primary) hypertension: Secondary | ICD-10-CM | POA: Diagnosis not present

## 2022-10-12 DIAGNOSIS — G8929 Other chronic pain: Secondary | ICD-10-CM | POA: Diagnosis not present

## 2022-10-12 DIAGNOSIS — M25511 Pain in right shoulder: Secondary | ICD-10-CM | POA: Diagnosis not present

## 2022-10-12 DIAGNOSIS — E039 Hypothyroidism, unspecified: Secondary | ICD-10-CM | POA: Diagnosis not present

## 2022-11-21 DIAGNOSIS — H353211 Exudative age-related macular degeneration, right eye, with active choroidal neovascularization: Secondary | ICD-10-CM | POA: Diagnosis not present

## 2022-11-29 DIAGNOSIS — H60332 Swimmer's ear, left ear: Secondary | ICD-10-CM | POA: Diagnosis not present

## 2022-11-29 DIAGNOSIS — H6122 Impacted cerumen, left ear: Secondary | ICD-10-CM | POA: Diagnosis not present

## 2022-12-07 ENCOUNTER — Other Ambulatory Visit: Payer: Self-pay | Admitting: Dermatology

## 2022-12-07 DIAGNOSIS — L300 Nummular dermatitis: Secondary | ICD-10-CM

## 2022-12-08 DIAGNOSIS — R300 Dysuria: Secondary | ICD-10-CM | POA: Diagnosis not present

## 2022-12-13 DIAGNOSIS — H16223 Keratoconjunctivitis sicca, not specified as Sjogren's, bilateral: Secondary | ICD-10-CM | POA: Diagnosis not present

## 2022-12-15 DIAGNOSIS — M810 Age-related osteoporosis without current pathological fracture: Secondary | ICD-10-CM | POA: Diagnosis not present

## 2022-12-19 DIAGNOSIS — H353211 Exudative age-related macular degeneration, right eye, with active choroidal neovascularization: Secondary | ICD-10-CM | POA: Diagnosis not present

## 2022-12-22 DIAGNOSIS — H60332 Swimmer's ear, left ear: Secondary | ICD-10-CM | POA: Diagnosis not present

## 2022-12-29 DIAGNOSIS — G5603 Carpal tunnel syndrome, bilateral upper limbs: Secondary | ICD-10-CM | POA: Diagnosis not present

## 2023-01-06 DIAGNOSIS — S6992XA Unspecified injury of left wrist, hand and finger(s), initial encounter: Secondary | ICD-10-CM | POA: Diagnosis not present

## 2023-01-16 DIAGNOSIS — H353211 Exudative age-related macular degeneration, right eye, with active choroidal neovascularization: Secondary | ICD-10-CM | POA: Diagnosis not present

## 2023-01-29 DIAGNOSIS — S8991XA Unspecified injury of right lower leg, initial encounter: Secondary | ICD-10-CM | POA: Diagnosis not present

## 2023-01-29 DIAGNOSIS — S8391XA Sprain of unspecified site of right knee, initial encounter: Secondary | ICD-10-CM | POA: Diagnosis not present

## 2023-01-29 DIAGNOSIS — M25561 Pain in right knee: Secondary | ICD-10-CM | POA: Diagnosis not present

## 2023-01-31 DIAGNOSIS — I10 Essential (primary) hypertension: Secondary | ICD-10-CM | POA: Diagnosis not present

## 2023-01-31 DIAGNOSIS — E061 Subacute thyroiditis: Secondary | ICD-10-CM | POA: Diagnosis not present

## 2023-01-31 DIAGNOSIS — E236 Other disorders of pituitary gland: Secondary | ICD-10-CM | POA: Diagnosis not present

## 2023-01-31 DIAGNOSIS — E039 Hypothyroidism, unspecified: Secondary | ICD-10-CM | POA: Diagnosis not present

## 2023-01-31 DIAGNOSIS — E063 Autoimmune thyroiditis: Secondary | ICD-10-CM | POA: Diagnosis not present

## 2023-02-07 ENCOUNTER — Ambulatory Visit: Payer: PPO | Admitting: Dermatology

## 2023-02-07 ENCOUNTER — Encounter: Payer: Self-pay | Admitting: Dermatology

## 2023-02-07 VITALS — BP 139/80

## 2023-02-07 DIAGNOSIS — L814 Other melanin hyperpigmentation: Secondary | ICD-10-CM

## 2023-02-07 DIAGNOSIS — E236 Other disorders of pituitary gland: Secondary | ICD-10-CM | POA: Diagnosis not present

## 2023-02-07 DIAGNOSIS — Z872 Personal history of diseases of the skin and subcutaneous tissue: Secondary | ICD-10-CM | POA: Diagnosis not present

## 2023-02-07 DIAGNOSIS — W908XXA Exposure to other nonionizing radiation, initial encounter: Secondary | ICD-10-CM

## 2023-02-07 DIAGNOSIS — M810 Age-related osteoporosis without current pathological fracture: Secondary | ICD-10-CM | POA: Diagnosis not present

## 2023-02-07 DIAGNOSIS — L570 Actinic keratosis: Secondary | ICD-10-CM | POA: Diagnosis not present

## 2023-02-07 DIAGNOSIS — L578 Other skin changes due to chronic exposure to nonionizing radiation: Secondary | ICD-10-CM | POA: Diagnosis not present

## 2023-02-07 DIAGNOSIS — E061 Subacute thyroiditis: Secondary | ICD-10-CM | POA: Diagnosis not present

## 2023-02-07 DIAGNOSIS — X32XXXA Exposure to sunlight, initial encounter: Secondary | ICD-10-CM | POA: Diagnosis not present

## 2023-02-07 DIAGNOSIS — Z85828 Personal history of other malignant neoplasm of skin: Secondary | ICD-10-CM | POA: Diagnosis not present

## 2023-02-07 DIAGNOSIS — Z1283 Encounter for screening for malignant neoplasm of skin: Secondary | ICD-10-CM | POA: Diagnosis not present

## 2023-02-07 DIAGNOSIS — D1801 Hemangioma of skin and subcutaneous tissue: Secondary | ICD-10-CM

## 2023-02-07 DIAGNOSIS — L821 Other seborrheic keratosis: Secondary | ICD-10-CM | POA: Diagnosis not present

## 2023-02-07 DIAGNOSIS — E063 Autoimmune thyroiditis: Secondary | ICD-10-CM | POA: Diagnosis not present

## 2023-02-07 DIAGNOSIS — E039 Hypothyroidism, unspecified: Secondary | ICD-10-CM | POA: Diagnosis not present

## 2023-02-07 DIAGNOSIS — D229 Melanocytic nevi, unspecified: Secondary | ICD-10-CM

## 2023-02-07 DIAGNOSIS — I10 Essential (primary) hypertension: Secondary | ICD-10-CM | POA: Diagnosis not present

## 2023-02-07 NOTE — Progress Notes (Signed)
Follow-Up Visit   Subjective  Mary Barker is a 89 y.o. female who presents for the following: Skin Cancer Screening and Full Body Skin Exam, hx of BCCs, SCC, Aks, check scaly spots scalp, no symptoms  The patient presents for Total-Body Skin Exam (TBSE) for skin cancer screening and mole check. The patient has spots, moles and lesions to be evaluated, some may be new or changing and the patient has concerns that these could be cancer.    The following portions of the chart were reviewed this encounter and updated as appropriate: medications, allergies, medical history  Review of Systems:  No other skin or systemic complaints except as noted in HPI or Assessment and Plan.  Objective  Well appearing patient in no apparent distress; mood and affect are within normal limits.  A full examination was performed including scalp, head, eyes, ears, nose, lips, neck, chest, axillae, abdomen, back, buttocks, bilateral upper extremities, bilateral lower extremities, hands, feet, fingers, toes, fingernails, and toenails. All findings within normal limits unless otherwise noted below.   Relevant physical exam findings are noted in the Assessment and Plan.  upper sternum x 2, L elbow x 1, R lower pretibia x 1, R inf jaw x 2, R occipital scalp x 1, crown scalp x 3, vertex scalp x 2 (12) Pink keratotic macules    Assessment & Plan   LENTIGINES, SEBORRHEIC KERATOSES, HEMANGIOMAS - Benign normal skin lesions - Benign-appearing - Call for any changes  MELANOCYTIC NEVI - Tan-brown and/or pink-flesh-colored symmetric macules and papules - Benign appearing on exam today - Observation - Call clinic for new or changing moles - Recommend daily use of broad spectrum spf 30+ sunscreen to sun-exposed areas.   ACTINIC DAMAGE - Chronic condition, secondary to cumulative UV/sun exposure - diffuse scaly erythematous macules with underlying dyspigmentation - Recommend daily broad spectrum sunscreen SPF  30+ to sun-exposed areas, reapply every 2 hours as needed.  - Staying in the shade or wearing long sleeves, sun glasses (UVA+UVB protection) and wide brim hats (4-inch brim around the entire circumference of the hat) are also recommended for sun protection.  - Call for new or changing lesions. - neck  SKIN CANCER SCREENING PERFORMED TODAY.  HISTORY OF BASAL CELL CARCINOMA OF THE SKIN - No evidence of recurrence today - Recommend regular full body skin exams - Recommend daily broad spectrum sunscreen SPF 30+ to sun-exposed areas, reapply every 2 hours as needed.  - Call if any new or changing lesions are noted between office visits  - R post scalp, L nasal tip, L post neck  HISTORY OF SQUAMOUS CELL CARCINOMA OF THE SKIN - No evidence of recurrence today - Recommend regular full body skin exams - Recommend daily broad spectrum sunscreen SPF 30+ to sun-exposed areas, reapply every 2 hours as needed.  - Call if any new or changing lesions are noted between office visits - R pretibia  Hypertrophic actinic keratosis (12) upper sternum x 2, L elbow x 1, R lower pretibia x 1, R inf jaw x 2, R occipital scalp x 1, crown scalp x 3, vertex scalp x 2  Vs ISK  Recheck R inf jaw and scalp on f/u   Destruction of lesion - upper sternum x 2, L elbow x 1, R lower pretibia x 1, R inf jaw x 2, R occipital scalp x 1, crown scalp x 3, vertex scalp x 2  Destruction method: cryotherapy   Informed consent: discussed and consent obtained   Lesion  destroyed using liquid nitrogen: Yes   Region frozen until ice ball extended beyond lesion: Yes   Outcome: patient tolerated procedure well with no complications   Post-procedure details: wound care instructions given   Additional details:  Prior to procedure, discussed risks of blister formation, small wound, skin dyspigmentation, or rare scar following cryotherapy. Recommend Vaseline ointment to treated areas while healing.   Skin cancer screening  Actinic  skin damage  History of SCC (squamous cell carcinoma) of skin  History of basal cell carcinoma (BCC) of skin  Nevus  Lentigo  Seborrheic keratosis   Return in about 6 months (around 08/09/2023) for recheck AKs R inf jaw and scalp, AK f/u.  I, Ardis Rowan, RMA, am acting as scribe for Willeen Niece, MD .   Documentation: I have reviewed the above documentation for accuracy and completeness, and I agree with the above.  Willeen Niece, MD

## 2023-02-07 NOTE — Patient Instructions (Addendum)
Cryotherapy Aftercare  Wash gently with soap and water everyday.   Apply Vaseline and Band-Aid daily until healed.   Recommend Head and shoulders shampoo to wash hair, massage in scalp let sit 10 minutes and rinse out  Due to recent changes in healthcare laws, you may see results of your pathology and/or laboratory studies on MyChart before the doctors have had a chance to review them. We understand that in some cases there may be results that are confusing or concerning to you. Please understand that not all results are received at the same time and often the doctors may need to interpret multiple results in order to provide you with the best plan of care or course of treatment. Therefore, we ask that you please give [place] 2 business days to thoroughly review all your results before contacting the office for clarification. Should we see a critical lab result, you will be contacted sooner.   If You Need Anything After Your Visit  If you have any questions or concerns for your doctor, please call our main line at [phone] and press option 4 to reach your doctor's medical assistant. If no one answers, please leave a voicemail as directed and we will return your call as soon as possible. Messages left after 4 pm will be answered the following business day.   You may also send [place] a message via MyChart. We typically respond to MyChart messages within 1-2 business days.  For prescription refills, please ask your pharmacy to contact our office. Our fax number is [phone].  If you have an urgent issue when the clinic is closed that cannot wait until the next business day, you can page your doctor at the number below.    Please note that while we do our best to be available for urgent issues outside of office hours, we are not available 24/7.   If you have an urgent issue and are unable to reach [place], you may choose to seek medical care at your doctor's office, retail clinic, urgent care center, or  emergency room.  If you have a medical emergency, please immediately call 911 or go to the emergency department.  Pager Numbers  - Dr. Gwen Pounds: [phone]  - Dr. Neale Burly: [phone]  - Dr. Roseanne Reno: [phone]  In the event of inclement weather, please call our main line at [phone] for an update on the status of any delays or closures.  Dermatology Medication Tips: Please keep the boxes that topical medications come in in order to help keep track of the instructions about where and how to use these. Pharmacies typically print the medication instructions only on the boxes and not directly on the medication tubes.   If your medication is too expensive, please contact our office at [phone] option 4 or send [place] a message through MyChart.   We are unable to tell what your co-pay for medications will be in advance as this is different depending on your insurance coverage. However, we may be able to find a substitute medication at lower cost or fill out paperwork to get insurance to cover a needed medication.   If a prior authorization is required to get your medication covered by your insurance company, please allow [place] 1-2 business days to complete this process.  Drug prices often vary depending on where the prescription is filled and some pharmacies may offer cheaper prices.  The website www.goodrx.com contains coupons for medications through different pharmacies. The prices here do not account for what the cost  may be with help from insurance (it may be cheaper with your insurance), but the website can give you the price if you did not use any insurance.  - You can print the associated coupon and take it with your prescription to the pharmacy.  - You may also stop by our office during regular business hours and pick up a GoodRx coupon card.  - If you need your prescription sent electronically to a different pharmacy, notify our office through [facility] or by phone at  [phone] option 4.     Si Usted Necesita Algo Despus de Su Visita  Tambin puede enviarnos un mensaje a travs de Clinical cytogeneticist. Por lo general respondemos a los mensajes de MyChart en el transcurso de 1 a 2 das hbiles.  Para renovar recetas, por favor pida a su farmacia que se ponga en contacto con nuestra oficina. Annie Sable de fax es [place] [phone].  Si tiene un asunto urgente cuando la clnica est cerrada y que no puede esperar hasta el siguiente da hbil, puede llamar/localizar a su doctor(a) al nmero que aparece a continuacin.   Por favor, tenga en cuenta que aunque hacemos todo lo posible para estar disponibles para asuntos urgentes fuera del horario de [place], no estamos disponibles las 24 horas del da, los 7 [address]  [address] de la [place].   Si tiene un problema urgente y no puede comunicarse con nosotros, puede optar por buscar atencin mdica  en el consultorio de su doctor(a), en una clnica privada, en un centro de atencin urgente o en una sala de emergencias.  Si tiene Engineer, drilling, por favor llame inmediatamente al 911 o vaya a la sala de emergencias.  Nmeros de bper  - Dr. Gwen Pounds: [phone]  - Dra. Moye: [phone]  - Dra. Roseanne Reno: [phone]  En caso de inclemencias del [place], por favor llame a Lacy Duverney principal al [phone] para una actualizacin sobre el [place] de cualquier retraso o cierre.  Consejos para la medicacin en dermatologa: Por favor, guarde las cajas en las que vienen los medicamentos de uso tpico para ayudarle a seguir las instrucciones sobre dnde y cmo usarlos. Las farmacias generalmente imprimen las instrucciones del medicamento slo en las cajas y no directamente en los tubos del [place].   Si su medicamento es muy caro, por favor, pngase en contacto con Rolm Gala llamando al [phone] y presione la opcin 4 o envenos un mensaje a travs de Clinical cytogeneticist.   No podemos decirle cul ser su copago por los  medicamentos por adelantado ya que esto es diferente dependiendo de la cobertura de su seguro. Sin embargo, es posible que podamos encontrar un medicamento sustituto a Audiological scientist un formulario para que el seguro cubra el medicamento que se considera necesario.   Si se requiere una autorizacin previa para que su compaa de seguros [place] su medicamento, por favor permtanos de 1 a 2 das hbiles para completar [address]  Los precios de los medicamentos varan con frecuencia dependiendo del Environmental consultant de dnde se surte la receta y alguna farmacias pueden ofrecer precios ms baratos.  El sitio web www.goodrx.com tiene cupones para medicamentos de Health and safety inspector. Los precios aqu no tienen en cuenta lo que podra costar con la ayuda del seguro (puede ser ms barato con su seguro), pero el sitio web puede darle el precio si no utiliz Tourist information centre manager.  - Puede imprimir el cupn correspondiente y llevarlo con su receta a la farmacia.  - Tambin puede pasar por [place]  oficina durante el horario de atencin regular y Education officer, museum una tarjeta de cupones de GoodRx.  - Si necesita que su receta se enve electrnicamente a una farmacia diferente, informe a nuestra oficina a travs de MyChart de [place] o por telfono llamando al [phone] y presione la opcin 4.

## 2023-02-08 ENCOUNTER — Encounter: Payer: Self-pay | Admitting: Dermatology

## 2023-02-11 ENCOUNTER — Encounter: Payer: Self-pay | Admitting: Dermatology

## 2023-02-13 DIAGNOSIS — H353211 Exudative age-related macular degeneration, right eye, with active choroidal neovascularization: Secondary | ICD-10-CM | POA: Diagnosis not present

## 2023-03-28 DIAGNOSIS — S81801A Unspecified open wound, right lower leg, initial encounter: Secondary | ICD-10-CM | POA: Diagnosis not present

## 2023-04-03 DIAGNOSIS — H353211 Exudative age-related macular degeneration, right eye, with active choroidal neovascularization: Secondary | ICD-10-CM | POA: Diagnosis not present

## 2023-04-03 DIAGNOSIS — H353122 Nonexudative age-related macular degeneration, left eye, intermediate dry stage: Secondary | ICD-10-CM | POA: Diagnosis not present

## 2023-04-03 DIAGNOSIS — H47012 Ischemic optic neuropathy, left eye: Secondary | ICD-10-CM | POA: Diagnosis not present

## 2023-04-04 DIAGNOSIS — S81801D Unspecified open wound, right lower leg, subsequent encounter: Secondary | ICD-10-CM | POA: Diagnosis not present

## 2023-04-12 DIAGNOSIS — M316 Other giant cell arteritis: Secondary | ICD-10-CM | POA: Diagnosis not present

## 2023-04-12 DIAGNOSIS — S81801D Unspecified open wound, right lower leg, subsequent encounter: Secondary | ICD-10-CM | POA: Diagnosis not present

## 2023-04-12 DIAGNOSIS — I10 Essential (primary) hypertension: Secondary | ICD-10-CM | POA: Diagnosis not present

## 2023-04-12 DIAGNOSIS — H3530 Unspecified macular degeneration: Secondary | ICD-10-CM | POA: Diagnosis not present

## 2023-04-12 DIAGNOSIS — E039 Hypothyroidism, unspecified: Secondary | ICD-10-CM | POA: Diagnosis not present

## 2023-04-12 DIAGNOSIS — R42 Dizziness and giddiness: Secondary | ICD-10-CM | POA: Diagnosis not present

## 2023-04-12 DIAGNOSIS — M1990 Unspecified osteoarthritis, unspecified site: Secondary | ICD-10-CM | POA: Diagnosis not present

## 2023-05-14 DIAGNOSIS — J029 Acute pharyngitis, unspecified: Secondary | ICD-10-CM | POA: Diagnosis not present

## 2023-05-14 DIAGNOSIS — J019 Acute sinusitis, unspecified: Secondary | ICD-10-CM | POA: Diagnosis not present

## 2023-05-14 DIAGNOSIS — B9689 Other specified bacterial agents as the cause of diseases classified elsewhere: Secondary | ICD-10-CM | POA: Diagnosis not present

## 2023-05-14 DIAGNOSIS — Z03818 Encounter for observation for suspected exposure to other biological agents ruled out: Secondary | ICD-10-CM | POA: Diagnosis not present

## 2023-05-29 DIAGNOSIS — H353211 Exudative age-related macular degeneration, right eye, with active choroidal neovascularization: Secondary | ICD-10-CM | POA: Diagnosis not present

## 2023-06-20 DIAGNOSIS — H43391 Other vitreous opacities, right eye: Secondary | ICD-10-CM | POA: Diagnosis not present

## 2023-06-22 DIAGNOSIS — H6123 Impacted cerumen, bilateral: Secondary | ICD-10-CM | POA: Diagnosis not present

## 2023-06-22 DIAGNOSIS — H903 Sensorineural hearing loss, bilateral: Secondary | ICD-10-CM | POA: Diagnosis not present

## 2023-06-27 DIAGNOSIS — Z20822 Contact with and (suspected) exposure to covid-19: Secondary | ICD-10-CM | POA: Diagnosis not present

## 2023-08-01 DIAGNOSIS — H353211 Exudative age-related macular degeneration, right eye, with active choroidal neovascularization: Secondary | ICD-10-CM | POA: Diagnosis not present

## 2023-08-17 DIAGNOSIS — E039 Hypothyroidism, unspecified: Secondary | ICD-10-CM | POA: Diagnosis not present

## 2023-08-21 ENCOUNTER — Ambulatory Visit: Payer: PPO | Admitting: Dermatology

## 2023-08-21 DIAGNOSIS — G5603 Carpal tunnel syndrome, bilateral upper limbs: Secondary | ICD-10-CM | POA: Diagnosis not present

## 2023-09-07 DIAGNOSIS — S81811A Laceration without foreign body, right lower leg, initial encounter: Secondary | ICD-10-CM | POA: Diagnosis not present

## 2023-09-21 DIAGNOSIS — S81811D Laceration without foreign body, right lower leg, subsequent encounter: Secondary | ICD-10-CM | POA: Diagnosis not present

## 2023-10-02 ENCOUNTER — Ambulatory Visit: Payer: PPO | Admitting: Dermatology

## 2023-10-02 DIAGNOSIS — L98499 Non-pressure chronic ulcer of skin of other sites with unspecified severity: Secondary | ICD-10-CM

## 2023-10-02 DIAGNOSIS — W908XXA Exposure to other nonionizing radiation, initial encounter: Secondary | ICD-10-CM

## 2023-10-02 DIAGNOSIS — L578 Other skin changes due to chronic exposure to nonionizing radiation: Secondary | ICD-10-CM | POA: Diagnosis not present

## 2023-10-02 DIAGNOSIS — L300 Nummular dermatitis: Secondary | ICD-10-CM | POA: Diagnosis not present

## 2023-10-02 DIAGNOSIS — L97918 Non-pressure chronic ulcer of unspecified part of right lower leg with other specified severity: Secondary | ICD-10-CM

## 2023-10-02 DIAGNOSIS — Z85828 Personal history of other malignant neoplasm of skin: Secondary | ICD-10-CM

## 2023-10-02 DIAGNOSIS — L570 Actinic keratosis: Secondary | ICD-10-CM | POA: Diagnosis not present

## 2023-10-02 MED ORDER — CLOBETASOL PROPIONATE 0.05 % EX SOLN: CUTANEOUS | 1 refills | Status: DC

## 2023-10-02 NOTE — Patient Instructions (Addendum)
Recommend OTC Gold Bond Rapid Relief Anti-Itch cream (pramoxine + menthol), CeraVe Anti-itch cream or lotion (pramoxine), Sarna lotion (Original- menthol + camphor or Sensitive- pramoxine) or Eucerin 12 hour Itch Relief lotion (menthol) up to 3 times per day to areas on body that are itchy.   Gentle Skin Care Guide  1. Bathe no more than once a day.  2. Avoid bathing in hot water  3. Use a mild soap like Dove, Vanicream, Cetaphil, CeraVe. Can use Lever 2000 or Cetaphil antibacterial soap  4. Use soap only where you need it. On most days, use it under your arms, between your legs, and on your feet. Let the water rinse other areas unless visibly dirty.  5. When you get out of the bath/shower, use a towel to gently blot your skin dry, don't rub it.  6. While your skin is still a little damp, apply a moisturizing cream such as Vanicream, CeraVe, Cetaphil, Eucerin, Sarna lotion or plain Vaseline Jelly. For hands apply Neutrogena [place] Hand Cream or Excipial Hand Cream.  7. Reapply moisturizer any time you start to itch or feel dry.  8. Sometimes using free and clear laundry detergents can be helpful. Fabric softener sheets should be avoided. Downy Free & Gentle liquid, or any liquid fabric softener that is free of dyes and perfumes, it acceptable to use  9. If your doctor has given you prescription creams you may apply moisturizers over them     Due to recent changes in healthcare laws, you may see results of your pathology and/or laboratory studies on MyChart before the doctors have had a chance to review them. We understand that in some cases there may be results that are confusing or concerning to you. Please understand that not all results are received at the same time and often the doctors may need to interpret multiple results in order to provide you with the best plan of care or course of treatment. Therefore, we ask that you please give [place] 2 business days to thoroughly review all your  results before contacting the office for clarification. Should we see a critical lab result, you will be contacted sooner.   If You Need Anything After Your Visit  If you have any questions or concerns for your doctor, please call our main line at [phone] and press option 4 to reach your doctor's medical assistant. If no one answers, please leave a voicemail as directed and we will return your call as soon as possible. Messages left after 4 pm will be answered the following business day.   You may also send [place] a message via MyChart. We typically respond to MyChart messages within 1-2 business days.  For prescription refills, please ask your pharmacy to contact our office. Our fax number is [phone].  If you have an urgent issue when the clinic is closed that cannot wait until the next business day, you can page your doctor at the number below.    Please note that while we do our best to be available for urgent issues outside of office hours, we are not available 24/7.   If you have an urgent issue and are unable to reach [place], you may choose to seek medical care at your doctor's office, retail clinic, urgent care center, or emergency room.  If you have a medical emergency, please immediately call 911 or go to the emergency department.  Pager Numbers  - Dr. Gwen Pounds: [phone]  - Dr. Roseanne Reno: [phone]  - Dr. Katrinka Blazing: [phone]  In the event of inclement weather, please call our main line at [phone] for an update on the status of any delays or closures.  Dermatology Medication Tips: Please keep the boxes that topical medications come in in order to help keep track of the instructions about where and how to use these. Pharmacies typically print the medication instructions only on the boxes and not directly on the medication tubes.   If your medication is too expensive, please contact our office at [phone] option 4 or send [place] a message through MyChart.   We are  unable to tell what your co-pay for medications will be in advance as this is different depending on your insurance coverage. However, we may be able to find a substitute medication at lower cost or fill out paperwork to get insurance to cover a needed medication.   If a prior authorization is required to get your medication covered by your insurance company, please allow [place] 1-2 business days to complete this process.  Drug prices often vary depending on where the prescription is filled and some pharmacies may offer cheaper prices.  The website www.goodrx.com contains coupons for medications through different pharmacies. The prices here do not account for what the cost may be with help from insurance (it may be cheaper with your insurance), but the website can give you the price if you did not use any insurance.  - You can print the associated coupon and take it with your prescription to the pharmacy.  - You may also stop by our office during regular business hours and pick up a GoodRx coupon card.  - If you need your prescription sent electronically to a different pharmacy, notify our office through [facility] or by phone at [phone] option 4.     Si Usted Necesita Algo Despus de Su Visita  Tambin puede enviarnos un mensaje a travs de Clinical cytogeneticist. Por lo general respondemos a los mensajes de MyChart en el transcurso de 1 a 2 das hbiles.  Para renovar recetas, por favor pida a su farmacia que se ponga en contacto con nuestra oficina. Annie Sable de fax es [place] [phone].  Si tiene un asunto urgente cuando la clnica est cerrada y que no puede esperar hasta el siguiente da hbil, puede llamar/localizar a su doctor(a) al nmero que aparece a continuacin.   Por favor, tenga en cuenta que aunque hacemos todo lo posible para estar disponibles para asuntos urgentes fuera del horario de [place], no estamos disponibles las 24 horas del da, los 7 [address]  [address] de la [facility].   Si tiene un  problema urgente y no puede comunicarse con nosotros, puede optar por buscar atencin mdica  en el consultorio de su doctor(a), en una clnica privada, en un centro de atencin urgente o en una sala de emergencias.  Si tiene Engineer, drilling, por favor llame inmediatamente al 911 o vaya a la sala de emergencias.  Nmeros de bper  - Dr. Gwen Pounds: [phone]  - Dra. Roseanne Reno: [phone]  - Dr. Katrinka Blazing: [phone]   En caso de inclemencias del tiempo, por favor llame a Lacy Duverney principal al [phone] para una actualizacin sobre el [place] de cualquier retraso o cierre.  Consejos para la medicacin en dermatologa: Por favor, guarde las cajas en las que vienen los medicamentos de uso tpico para ayudarle a seguir las instrucciones sobre dnde y cmo usarlos. Las farmacias generalmente imprimen las instrucciones del medicamento slo en las cajas y no directamente en los tubos del [place].  Si su medicamento es muy caro, por favor, pngase en contacto con Rolm Gala llamando al [phone] y presione la opcin 4 o envenos un mensaje a travs de Clinical cytogeneticist.   No podemos decirle cul ser su copago por los medicamentos por adelantado ya que esto es diferente dependiendo de la cobertura de su seguro. Sin embargo, es posible que podamos encontrar un medicamento sustituto a Audiological scientist un formulario para que el seguro cubra el medicamento que se considera necesario.   Si se requiere una autorizacin previa para que su compaa de seguros [place] su medicamento, por favor permtanos de 1 a 2 das hbiles para completar [address]  Los precios de los medicamentos varan con frecuencia dependiendo del Environmental consultant de dnde se surte la receta y alguna farmacias pueden ofrecer precios ms baratos.  El sitio web www.goodrx.com tiene cupones para medicamentos de Health and safety inspector. Los precios aqu no tienen en cuenta lo que podra costar con la ayuda del seguro (puede ser ms  barato con su seguro), pero el sitio web puede darle el precio si no utiliz Tourist information centre manager.  - Puede imprimir el cupn correspondiente y llevarlo con su receta a la farmacia.  - Tambin puede pasar por nuestra oficina durante el horario de atencin regular y Education officer, museum una tarjeta de cupones de GoodRx.  - Si necesita que su receta se enve electrnicamente a una farmacia diferente, informe a nuestra oficina a travs de MyChart de [place] o por telfono llamando al [phone] y presione la opcin 4.

## 2023-10-02 NOTE — Progress Notes (Signed)
Follow-Up Visit   Subjective  Mary Barker is a 89 y.o. female who presents for the following: Pt here today for recheck of hypertrophic Aks on the face and scalp.   The following portions of the chart were reviewed this encounter and updated as appropriate: medications, allergies, medical history  Review of Systems:  No other skin or systemic complaints except as noted in HPI or Assessment and Plan.  Objective  Well appearing patient in no apparent distress; mood and affect are within normal limits.  A focused examination was performed of the following areas:   Relevant exam findings are noted in the Assessment and Plan.  R inf jaw x 1, mid crown scalp x 1, L lower cheek x 1 (3) Keratotic papules.  Assessment & Plan   ACTINIC DAMAGE - chronic, secondary to cumulative UV radiation exposure/sun exposure over time - diffuse scaly erythematous macules with underlying dyspigmentation - Recommend daily broad spectrum sunscreen SPF 30+ to sun-exposed areas, reapply every 2 hours as needed.  - Recommend staying in the shade or wearing long sleeves, sun glasses (UVA+UVB protection) and wide brim hats (4-inch brim around the entire circumference of the hat). - Call for new or changing lesions. HYPERTROPHIC ACTINIC KERATOSIS (3) R inf jaw x 1, mid crown scalp x 1, L lower cheek x 1 (3) Actinic keratoses are precancerous spots that appear secondary to cumulative UV radiation exposure/sun exposure over time. They are chronic with expected duration over 1 year. A portion of actinic keratoses will progress to squamous cell carcinoma of the skin. It is not possible to reliably predict which spots will progress to skin cancer and so treatment is recommended to prevent development of skin cancer.  Recommend daily broad spectrum sunscreen SPF 30+ to sun-exposed areas, reapply every 2 hours as needed.  Recommend staying in the shade or wearing long sleeves, sun glasses (UVA+UVB protection) and  wide brim hats (4-inch brim around the entire circumference of the hat). Call for new or changing lesions.  Patient defers checking lesions due to age and pain from LN2, she will call back to schedule an appointment if any lesions are bothersome to her. Destruction of lesion - R inf jaw x 1, mid crown scalp x 1, L lower cheek x 1 (3)  Destruction method: cryotherapy   Informed consent: discussed and consent obtained   Lesion destroyed using liquid nitrogen: Yes   Region frozen until ice ball extended beyond lesion: Yes   Outcome: patient tolerated procedure well with no complications   Post-procedure details: wound care instructions given   Additional details:  Prior to procedure, discussed risks of blister formation, small wound, skin dyspigmentation, or rare scar following cryotherapy. Recommend Vaseline ointment to treated areas while healing.  NUMMULAR DERMATITIS   Related Medications clobetasol (TEMOVATE) 0.05 % external solution MIX IN 1 TUB OF CERAVE CREAM, APPLY TO ITCHY RASH ON ARMS AND LEGS DAILY TO TWICE DAILY UNTIL CLEAR THEN AS NEEDED AVOID FACE, GROIN AND AXILLA  HISTORY OF BASAL CELL CARCINOMA OF THE SKIN - No evidence of recurrence today - Recommend regular full body skin exams - Recommend daily broad spectrum sunscreen SPF 30+ to sun-exposed areas, reapply every 2 hours as needed.  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 Condition is improving with treatment but not currently at goal. Still some itching.  Recommend mild soap and moisturizing cream 1-2 times daily.  Gentle skin care handout provided.     Cont Dove for sensitive skin soap Start CeraVe anti-itch cream daily and prn itch Continue if flared Clobetasol/Cerave cream bid to arms and legs until clear up to 4 weeks, then prn flares, avoid f/g/a Caution skin  atrophy with long-term use.    Topical steroids (such as triamcinolone, fluocinolone, fluocinonide, mometasone, clobetasol, halobetasol, betamethasone, hydrocortisone) can cause thinning and lightening of the skin if they are used for too long in the same area. Your physician has selected the right strength medicine for your problem and area affected on the body. Please use your medication only as directed by your physician to prevent side effects.  Recommend OTC Gold Bond Rapid Relief Anti-Itch cream (pramoxine + menthol), CeraVe Anti-itch cream or lotion (pramoxine), Sarna lotion (Original- menthol + camphor or Sensitive- pramoxine) or Eucerin 12 hour Itch Relief lotion (menthol) up to 3 times per day to areas on body that are itchy.  Traumatic Skin ulceration- R lower leg Exam: full thickness ulceration R pretibia Recommend wound care daily. Continue Mupirocin 2% ointment daily and cover until healed.  Return in about 1 year (around 10/01/2024) for TBSE - HX BCC, SCC, AKS.  Maylene Roes, CMA, am acting as scribe for Willeen Niece, MD .  Documentation: I have reviewed the above documentation for accuracy and completeness, and I agree with the above.  Willeen Niece, MD

## 2023-10-10 DIAGNOSIS — H353211 Exudative age-related macular degeneration, right eye, with active choroidal neovascularization: Secondary | ICD-10-CM | POA: Diagnosis not present

## 2023-10-17 DIAGNOSIS — E039 Hypothyroidism, unspecified: Secondary | ICD-10-CM | POA: Diagnosis not present

## 2023-10-17 DIAGNOSIS — R42 Dizziness and giddiness: Secondary | ICD-10-CM | POA: Diagnosis not present

## 2023-10-17 DIAGNOSIS — I10 Essential (primary) hypertension: Secondary | ICD-10-CM | POA: Diagnosis not present

## 2023-10-24 DIAGNOSIS — E038 Other specified hypothyroidism: Secondary | ICD-10-CM | POA: Diagnosis not present

## 2023-10-24 DIAGNOSIS — R2689 Other abnormalities of gait and mobility: Secondary | ICD-10-CM | POA: Diagnosis not present

## 2023-10-24 DIAGNOSIS — E039 Hypothyroidism, unspecified: Secondary | ICD-10-CM | POA: Diagnosis not present

## 2023-10-24 DIAGNOSIS — Z1331 Encounter for screening for depression: Secondary | ICD-10-CM | POA: Diagnosis not present

## 2023-10-24 DIAGNOSIS — G5603 Carpal tunnel syndrome, bilateral upper limbs: Secondary | ICD-10-CM | POA: Diagnosis not present

## 2023-10-24 DIAGNOSIS — R42 Dizziness and giddiness: Secondary | ICD-10-CM | POA: Diagnosis not present

## 2023-10-24 DIAGNOSIS — Z0000 Encounter for general adult medical examination without abnormal findings: Secondary | ICD-10-CM | POA: Diagnosis not present

## 2023-10-24 DIAGNOSIS — J449 Chronic obstructive pulmonary disease, unspecified: Secondary | ICD-10-CM | POA: Diagnosis not present

## 2023-10-24 DIAGNOSIS — I10 Essential (primary) hypertension: Secondary | ICD-10-CM | POA: Diagnosis not present

## 2023-10-24 DIAGNOSIS — M316 Other giant cell arteritis: Secondary | ICD-10-CM | POA: Diagnosis not present

## 2023-11-02 DIAGNOSIS — R2681 Unsteadiness on feet: Secondary | ICD-10-CM | POA: Diagnosis not present

## 2023-11-09 DIAGNOSIS — R2681 Unsteadiness on feet: Secondary | ICD-10-CM | POA: Diagnosis not present

## 2023-11-20 DIAGNOSIS — R2681 Unsteadiness on feet: Secondary | ICD-10-CM | POA: Diagnosis not present

## 2023-11-24 DIAGNOSIS — R2681 Unsteadiness on feet: Secondary | ICD-10-CM | POA: Diagnosis not present

## 2023-11-28 DIAGNOSIS — R2681 Unsteadiness on feet: Secondary | ICD-10-CM | POA: Diagnosis not present

## 2023-11-28 DIAGNOSIS — S81811A Laceration without foreign body, right lower leg, initial encounter: Secondary | ICD-10-CM | POA: Diagnosis not present

## 2023-12-07 DIAGNOSIS — R2681 Unsteadiness on feet: Secondary | ICD-10-CM | POA: Diagnosis not present

## 2023-12-14 DIAGNOSIS — R2681 Unsteadiness on feet: Secondary | ICD-10-CM | POA: Diagnosis not present

## 2023-12-19 DIAGNOSIS — H353211 Exudative age-related macular degeneration, right eye, with active choroidal neovascularization: Secondary | ICD-10-CM | POA: Diagnosis not present

## 2023-12-21 DIAGNOSIS — R2681 Unsteadiness on feet: Secondary | ICD-10-CM | POA: Diagnosis not present

## 2023-12-27 DIAGNOSIS — H903 Sensorineural hearing loss, bilateral: Secondary | ICD-10-CM | POA: Diagnosis not present

## 2023-12-27 DIAGNOSIS — H6123 Impacted cerumen, bilateral: Secondary | ICD-10-CM | POA: Diagnosis not present

## 2023-12-28 DIAGNOSIS — R2681 Unsteadiness on feet: Secondary | ICD-10-CM | POA: Diagnosis not present

## 2024-01-01 DIAGNOSIS — G5603 Carpal tunnel syndrome, bilateral upper limbs: Secondary | ICD-10-CM | POA: Diagnosis not present

## 2024-01-08 DIAGNOSIS — R2681 Unsteadiness on feet: Secondary | ICD-10-CM | POA: Diagnosis not present

## 2024-02-12 DIAGNOSIS — S81811A Laceration without foreign body, right lower leg, initial encounter: Secondary | ICD-10-CM | POA: Diagnosis not present

## 2024-02-19 DIAGNOSIS — G8929 Other chronic pain: Secondary | ICD-10-CM | POA: Diagnosis not present

## 2024-02-19 DIAGNOSIS — R42 Dizziness and giddiness: Secondary | ICD-10-CM | POA: Diagnosis not present

## 2024-02-19 DIAGNOSIS — R2681 Unsteadiness on feet: Secondary | ICD-10-CM | POA: Diagnosis not present

## 2024-02-19 DIAGNOSIS — R413 Other amnesia: Secondary | ICD-10-CM | POA: Diagnosis not present

## 2024-02-19 DIAGNOSIS — M25511 Pain in right shoulder: Secondary | ICD-10-CM | POA: Diagnosis not present

## 2024-02-27 DIAGNOSIS — S51012D Laceration without foreign body of left elbow, subsequent encounter: Secondary | ICD-10-CM | POA: Diagnosis not present

## 2024-02-27 DIAGNOSIS — R2681 Unsteadiness on feet: Secondary | ICD-10-CM | POA: Diagnosis not present

## 2024-02-27 DIAGNOSIS — L089 Local infection of the skin and subcutaneous tissue, unspecified: Secondary | ICD-10-CM | POA: Diagnosis not present

## 2024-02-27 DIAGNOSIS — T148XXA Other injury of unspecified body region, initial encounter: Secondary | ICD-10-CM | POA: Diagnosis not present

## 2024-02-28 ENCOUNTER — Other Ambulatory Visit: Payer: Self-pay

## 2024-02-28 ENCOUNTER — Emergency Department
Admission: EM | Admit: 2024-02-28 | Discharge: 2024-02-28 | Disposition: A | Discharge status: Home / Self Care | Attending: Emergency Medicine | Admitting: Emergency Medicine

## 2024-02-28 DIAGNOSIS — I10 Essential (primary) hypertension: Secondary | ICD-10-CM | POA: Insufficient documentation

## 2024-02-28 DIAGNOSIS — E871 Hypo-osmolality and hyponatremia: Secondary | ICD-10-CM | POA: Insufficient documentation

## 2024-02-28 DIAGNOSIS — R42 Dizziness and giddiness: Secondary | ICD-10-CM | POA: Diagnosis not present

## 2024-02-28 DIAGNOSIS — J449 Chronic obstructive pulmonary disease, unspecified: Secondary | ICD-10-CM | POA: Diagnosis not present

## 2024-02-28 DIAGNOSIS — R030 Elevated blood-pressure reading, without diagnosis of hypertension: Secondary | ICD-10-CM | POA: Diagnosis present

## 2024-02-28 LAB — URINALYSIS, COMPLETE (UACMP) WITH MICROSCOPIC
Bilirubin Urine: NEGATIVE
Glucose, UA: NEGATIVE mg/dL
Hgb urine dipstick: NEGATIVE
Ketones, ur: NEGATIVE mg/dL
Leukocytes,Ua: NEGATIVE
Nitrite: NEGATIVE
Protein, ur: NEGATIVE mg/dL
Specific Gravity, Urine: 1.009 (ref 1.005–1.030)
pH: 6 (ref 5.0–8.0)

## 2024-02-28 LAB — BASIC METABOLIC PANEL WITH GFR
Anion gap: 10 (ref 5–15)
BUN: 13 mg/dL (ref 8–23)
CO2: 22 mmol/L (ref 22–32)
Calcium: 9 mg/dL (ref 8.9–10.3)
Chloride: 95 mmol/L — ABNORMAL LOW (ref 98–111)
Creatinine, Ser: 0.68 mg/dL (ref 0.44–1.00)
GFR, Estimated: >60 mL/min (ref >60)
Glucose, Bld: 175 mg/dL — ABNORMAL HIGH (ref 70–99)
Potassium: 3.8 mmol/L (ref 3.5–5.1)
Sodium: 127 mmol/L — ABNORMAL LOW (ref 135–145)

## 2024-02-28 LAB — CBC
HCT: 41.3 % (ref 36.0–46.0)
Hemoglobin: 14.3 g/dL (ref 12.0–15.0)
MCH: 31.6 pg (ref 26.0–34.0)
MCHC: 34.6 g/dL (ref 30.0–36.0)
MCV: 91.4 fL (ref 80.0–100.0)
Platelets: 264 10*3/uL (ref 150–400)
RBC: 4.52 MIL/uL (ref 3.87–5.11)
RDW: 14.6 % (ref 11.5–15.5)
WBC: 12.8 10*3/uL — ABNORMAL HIGH (ref 4.0–10.5)
nRBC: 0 % (ref 0.0–0.2)

## 2024-02-28 LAB — TROPONIN I (HIGH SENSITIVITY): Troponin I (High Sensitivity): 10 ng/L (ref <18)

## 2024-02-28 MED ORDER — SODIUM CHLORIDE 0.9 % IV BOLUS
1000.0000 mL | Freq: Once | INTRAVENOUS | Status: AC
  Administered 2024-02-28: 1000 mL via INTRAVENOUS

## 2024-02-28 NOTE — ED Provider Notes (Signed)
 [facility] Provider Note    Event Date/Time   First MD Initiated Contact with Patient 02/28/24 1438     (approximate)  History   Chief Complaint: Hypertension  HPI  Mary Barker is a 89 y.o. female with a past medical history of COPD, hypertension, presents to the emergency department for dizziness and high blood pressure.  According to the patient she has a history of vertigo, she went to vertigo PT per patient yesterday where she had a maneuver performed on her.  She states since that time she has continued to feel dizzy which she thought was related to the vertigo.  However she took her blood pressure this morning and it was significantly elevated which concerned the patient so she came to the emergency department.  Patient denies any chest pain.  Denies any shortness of breath denies any focal weakness or numbness.  No headache.  Patient's blood pressure is somewhat elevated currently 167/86.  Physical Exam   Triage Vital Signs: ED Triage Vitals [02/28/24 1410]  Encounter Vitals Group     BP 127/85     Girls Systolic BP Percentile      Girls Diastolic BP Percentile      Boys Systolic BP Percentile      Boys Diastolic BP Percentile      Pulse Rate 99     Resp 17     Temp 97.9 F (36.6 C)     Temp Source Oral     SpO2 99 %     Weight 108 lb (49 kg)     Height 5' (1.524 m)     Head Circumference      Peak Flow      Pain Score 0     Pain Loc      Pain Education      Exclude from Growth Chart     Most recent vital signs: Vitals:   02/28/24 1439 02/28/24 1500  BP: (!) 189/100 (!) 167/86  Pulse:  95  Resp:    Temp:    SpO2:  97%    General: Awake, no distress.  CV:  Good peripheral perfusion.  Regular rate and rhythm  Resp:  Normal effort.  Equal breath sounds bilaterally.  Abd:  No distention.  Soft, nontender.  No rebound or guarding.  ED Results / Procedures / Treatments   EKG  EKG viewed and interpreted by myself shows a  normal sinus rhythm at 98 bpm with a narrow QRS, normal axis, normal intervals, no concerning ST changes.  MEDICATIONS ORDERED IN ED: Medications  sodium chloride  0.9 % bolus 1,000 mL (has no administration in time range)     IMPRESSION / MDM / ASSESSMENT AND PLAN / ED COURSE  I reviewed the triage vital signs and the nursing notes.  Patient's presentation is most consistent with acute presentation with potential threat to life or bodily function.  Patient presents to the emergency department for elevated blood pressure as well as some dizziness.  Overall the patient appears well, no distress.  Reassuring physical exam findings.  Overall reassuring vitals besides mild hypertension patient's blood pressure currently 167/86.  We will check labs including a CBC chemistry and troponin and obtain an EKG.  Patient's heart rate is somewhat elevated around 100 bpm we will dose IV fluids this will hopefully help us  obtain a urinalysis.  Patient's workup is overall reassuring urinalysis is normal, CBC reassuring chemistry does show mild hyponatremia at 127.  Patient received 1  L of normal saline.  Patient states a history of hyponatremia in the past and she is supposed to be on a fluid restricted diet however she says over the last 2 days or so due to the dizziness she thought it could be hydration related so she has been drinking a lot more water.  Patient's troponin is reassuringly negative.  EKG shows no concerning findings.  Patient is convinced that her symptoms are due to her vertigo.  She states she has tried meclizine in the past that does not work for her she does not want to take this medication.  Patient has not yet taken her evening medications including Lopressor  per patient.  Given the patient's reassuring workup I believe she is safe for discharge home.  She would like to see an ENT for her vertigo as she has been seeing physical therapy for the vertigo but believes ENT may be helpful for her.   Will refer to ENT.  Discussed with the patient to limit her fluids somewhat over the next few days and to increase her salt intake and follow-up with her doctor on Friday to recheck her labs including her sodium level.  Patient agreeable to plan.    FINAL CLINICAL IMPRESSION(S) / ED DIAGNOSES   Hypertension Dizziness   Note:  This document was prepared using Dragon voice recognition software and may include unintentional dictation errors.   Dorothyann Drivers, MD 02/28/24 JEROLYN

## 2024-02-28 NOTE — ED Notes (Signed)
 Pt here with htn. Pt states she has been feeling weak for a few days along with SOB. Pt also states she recently started an abx for a small skin tear to her right leg.

## 2024-02-28 NOTE — ED Triage Notes (Signed)
 Pt sts that she has been having hypertension since this AM. Pt sts that she takes BP meds daily.

## 2024-02-28 NOTE — Discharge Instructions (Addendum)
 As we discussed please limit your water intake as recommended by your physician over the next few days.  Please increase your oral salt intake in your diet.  Please call the number provided for ENT to arrange a follow-up appointment as soon as possible.  Return to the emergency department for any worsening symptoms any weakness or numbness confusion slurred speech or any other symptom personally concerning to yourself.  Otherwise please follow-up with your doctor on Friday for reexamination and recheck of your sodium level.

## 2024-03-01 ENCOUNTER — Emergency Department (HOSPITAL_COMMUNITY)
Admission: EM | Admit: 2024-03-01 | Discharge: 2024-03-01 | Disposition: A | Discharge status: Home / Self Care | Attending: Emergency Medicine | Admitting: Emergency Medicine

## 2024-03-01 ENCOUNTER — Encounter (HOSPITAL_COMMUNITY): Payer: Self-pay

## 2024-03-01 ENCOUNTER — Emergency Department (HOSPITAL_COMMUNITY)

## 2024-03-01 ENCOUNTER — Other Ambulatory Visit: Payer: Self-pay

## 2024-03-01 DIAGNOSIS — I672 Cerebral atherosclerosis: Secondary | ICD-10-CM | POA: Diagnosis not present

## 2024-03-01 DIAGNOSIS — R42 Dizziness and giddiness: Secondary | ICD-10-CM

## 2024-03-01 DIAGNOSIS — I10 Essential (primary) hypertension: Secondary | ICD-10-CM | POA: Diagnosis not present

## 2024-03-01 DIAGNOSIS — R350 Frequency of micturition: Secondary | ICD-10-CM | POA: Insufficient documentation

## 2024-03-01 DIAGNOSIS — R8290 Unspecified abnormal findings in urine: Secondary | ICD-10-CM | POA: Diagnosis not present

## 2024-03-01 DIAGNOSIS — D72829 Elevated white blood cell count, unspecified: Secondary | ICD-10-CM | POA: Diagnosis not present

## 2024-03-01 DIAGNOSIS — I6782 Cerebral ischemia: Secondary | ICD-10-CM | POA: Diagnosis not present

## 2024-03-01 DIAGNOSIS — I6523 Occlusion and stenosis of bilateral carotid arteries: Secondary | ICD-10-CM | POA: Diagnosis not present

## 2024-03-01 DIAGNOSIS — E042 Nontoxic multinodular goiter: Secondary | ICD-10-CM | POA: Diagnosis not present

## 2024-03-01 DIAGNOSIS — E871 Hypo-osmolality and hyponatremia: Secondary | ICD-10-CM | POA: Diagnosis not present

## 2024-03-01 DIAGNOSIS — R2681 Unsteadiness on feet: Secondary | ICD-10-CM | POA: Diagnosis not present

## 2024-03-01 LAB — URINALYSIS, ROUTINE W REFLEX MICROSCOPIC
Bilirubin Urine: NEGATIVE
Glucose, UA: NEGATIVE mg/dL
Hgb urine dipstick: NEGATIVE
Ketones, ur: NEGATIVE mg/dL
Leukocytes,Ua: NEGATIVE
Nitrite: NEGATIVE
Protein, ur: NEGATIVE mg/dL
Specific Gravity, Urine: 1.008 (ref 1.005–1.030)
pH: 7 (ref 5.0–8.0)

## 2024-03-01 LAB — COMPREHENSIVE METABOLIC PANEL WITH GFR
ALT: 21 U/L (ref 0–44)
AST: 28 U/L (ref 15–41)
Albumin: 4 g/dL (ref 3.5–5.0)
Alkaline Phosphatase: 48 U/L (ref 38–126)
Anion gap: 10 (ref 5–15)
BUN: 13 mg/dL (ref 8–23)
CO2: 22 mmol/L (ref 22–32)
Calcium: 9.3 mg/dL (ref 8.9–10.3)
Chloride: 102 mmol/L (ref 98–111)
Creatinine, Ser: 0.66 mg/dL (ref 0.44–1.00)
GFR, Estimated: >60 mL/min (ref >60)
Glucose, Bld: 101 mg/dL — ABNORMAL HIGH (ref 70–99)
Potassium: 4.1 mmol/L (ref 3.5–5.1)
Sodium: 134 mmol/L — ABNORMAL LOW (ref 135–145)
Total Bilirubin: 0.7 mg/dL (ref 0.0–1.2)
Total Protein: 7 g/dL (ref 6.5–8.1)

## 2024-03-01 LAB — TROPONIN I (HIGH SENSITIVITY): Troponin I (High Sensitivity): 13 ng/L (ref <18)

## 2024-03-01 LAB — CBC WITH DIFFERENTIAL/PLATELET
Abs Immature Granulocytes: 0.07 10*3/uL (ref 0.00–0.07)
Basophils Absolute: 0.1 10*3/uL (ref 0.0–0.1)
Basophils Relative: 1 %
Eosinophils Absolute: 0.1 10*3/uL (ref 0.0–0.5)
Eosinophils Relative: 1 %
HCT: 42.3 % (ref 36.0–46.0)
Hemoglobin: 14.2 g/dL (ref 12.0–15.0)
Immature Granulocytes: 1 %
Lymphocytes Relative: 14 %
Lymphs Abs: 1.6 10*3/uL (ref 0.7–4.0)
MCH: 31.3 pg (ref 26.0–34.0)
MCHC: 33.6 g/dL (ref 30.0–36.0)
MCV: 93.4 fL (ref 80.0–100.0)
Monocytes Absolute: 0.8 10*3/uL (ref 0.1–1.0)
Monocytes Relative: 7 %
Neutro Abs: 9.1 10*3/uL — ABNORMAL HIGH (ref 1.7–7.7)
Neutrophils Relative %: 76 %
Platelets: 252 10*3/uL (ref 150–400)
RBC: 4.53 MIL/uL (ref 3.87–5.11)
RDW: 14.7 % (ref 11.5–15.5)
WBC: 11.8 10*3/uL — ABNORMAL HIGH (ref 4.0–10.5)
nRBC: 0 % (ref 0.0–0.2)

## 2024-03-01 MED ORDER — GADOBUTROL 1 MMOL/ML IV SOLN
5.0000 mL | Freq: Once | INTRAVENOUS | Status: AC | PRN
  Administered 2024-03-01: 5 mL via INTRAVENOUS

## 2024-03-01 MED ORDER — LOSARTAN POTASSIUM 50 MG PO TABS
25.0000 mg | ORAL_TABLET | Freq: Once | ORAL | Status: AC
  Administered 2024-03-01: 25 mg via ORAL
  Filled 2024-03-01: qty 1

## 2024-03-01 MED ORDER — PHENAZOPYRIDINE HCL 95 MG PO TABS: 95.0000 mg | ORAL_TABLET | Freq: Two times a day (BID) | ORAL | 0 refills | Status: AC | PRN

## 2024-03-01 MED ORDER — IOHEXOL 350 MG/ML SOLN
75.0000 mL | Freq: Once | INTRAVENOUS | Status: AC | PRN
  Administered 2024-03-01: 75 mL via INTRAVENOUS

## 2024-03-01 NOTE — ED Notes (Signed)
Got patient into a gown on the monitor patient has family at bedside and call bell in reach

## 2024-03-01 NOTE — ED Provider Notes (Signed)
 [facility] Provider Note   CSN: [id number] Arrival date & time: 03/01/24  1323     Patient presents with: Hypertension and Abnormal Lab (Low NA)   Mary Barker is a 89 y.o. female here for evaluation of dizziness and elevated blood pressure.  She states she has recurrent vertigo.  She was seen by her PCP for this a few days ago and was sent to the emergency department due to significantly elevated blood pressure.  While she was seen there she had some labs drawn EKG which showed a mildly low sodium.  She is posted in a fluid restrictive diet which she has not really been following.  She states she subsequently discharged follow back up with PT today due to her vertigo type symptoms and had a blood pressure greater than 200 systolic and was subsequently sent back here.  She voices concern for persistently elevated blood pressures.  Initially denies history of hypertension but then states she takes Cozaar  and atenolol .  She states her blood pressures are typically very well-controlled.  States this morning she did have a headache.  She denies any sudden onset thunderclap headache.  She states she still has some dizziness.  No blurred vision, numbness, weakness.  No chest pain, shortness breath, abdominal pain.  No nausea or vomiting.  She does note she has been urinating more frequently however denies any dysuria, hematuria, flank pain.  Has difficulty describing her dizziness.  Does state feel similar to her prior episodes of vertigo.   HPI     Prior to Admission medications   Medication Sig Start Date End Date Taking? Authorizing Provider  acetaminophen  (TYLENOL ) 500 MG tablet Take 500 mg by mouth at bedtime.   Yes [provider]  alendronate  (FOSAMAX ) 70 MG tablet Take 70 mg by mouth every Sunday.   Yes [provider]  atenolol  (TENORMIN ) 25 MG tablet Take 25 mg by mouth every other day.   Yes [provider]   fluticasone  (FLONASE ) 50 MCG/ACT nasal spray Place 2 sprays into both nostrils at bedtime.   Yes [provider]  levothyroxine  (SYNTHROID , LEVOTHROID) 125 MCG tablet Take 125 mcg by mouth See admin instructions. Take 1 tablet (125mcg) by mouth every day except Wednesday and Sunday (5 days a week). Take first thing in the morning before breakfast and other medications.   Yes [provider]  losartan  (COZAAR ) 25 MG tablet Take 25 mg by mouth daily.    Yes [provider]  montelukast  (SINGULAIR ) 10 MG tablet Take 10 mg by mouth at bedtime.   Yes [provider]  Multiple Vitamins-Minerals (PRESERVISION AREDS 2) CAPS Take 1 capsule by mouth 2 (two) times daily.   Yes [provider]  OVER THE COUNTER MEDICATION Take 1 capsule by mouth See admin instructions. [facility]: Dynamic Brain supplement. Take 1 capsule by mouth twice daily, after lunch and before bed.   Yes [provider]  phenazopyridine (PYRIDIUM) 95 MG tablet Take 1 tablet (95 mg total) by mouth 2 (two) times daily as needed for pain. 03/01/24  Yes Laurice Iglesia A, PA-C  spironolactone  (ALDACTONE ) 25 MG tablet Take 25 mg by mouth daily.   Yes [provider]  timolol (BETIMOL) 0.5 % ophthalmic solution Place 1 drop into both eyes daily after supper.   Yes [provider]  Varenicline Tartrate (TYRVAYA) 0.03 MG/ACT SOLN Place 1 spray into right nostril daily after breakfast.   Yes [provider]    Allergies: Codeine    Review of Systems  Constitutional: Negative.   HENT: Negative.    Respiratory: Negative.    Cardiovascular: Negative.   Gastrointestinal: Negative.   Genitourinary: Negative.   Musculoskeletal: Negative.   Skin: Negative.   Neurological:  Positive for dizziness and headaches.  All other systems reviewed and are negative.   Updated Vital Signs BP (!) 174/95   Pulse 86   Temp 98.5 F (36.9 C)   Resp 16   Ht 5' (1.524  m)   Wt 49 kg   SpO2 98%   BMI 21.09 kg/m   Physical Exam Vitals and nursing note reviewed.  Constitutional:      General: She is not in acute distress.    Appearance: She is well-developed. She is not ill-appearing, toxic-appearing or diaphoretic.  HENT:     Head: Normocephalic and atraumatic.     Nose: Nose normal.     Mouth/Throat:     Mouth: Mucous membranes are moist.   Eyes:     Pupils: Pupils are equal, round, and reactive to light.    Cardiovascular:     Rate and Rhythm: Normal rate.     Pulses: Normal pulses.     Heart sounds: Normal heart sounds.  Pulmonary:     Effort: Pulmonary effort is normal. No respiratory distress.     Breath sounds: Normal breath sounds.  Abdominal:     General: There is no distension.     Palpations: Abdomen is soft.     Tenderness: There is no abdominal tenderness. There is no right CVA tenderness, left CVA tenderness, guarding or rebound.   Musculoskeletal:        General: No swelling, tenderness, deformity or signs of injury. Normal range of motion.     Cervical back: Normal range of motion.     Right lower leg: No edema.     Left lower leg: No edema.   Skin:    General: Skin is warm and dry.     Capillary Refill: Capillary refill takes less than 2 seconds.   Neurological:     General: No focal deficit present.     Mental Status: She is alert.     Cranial Nerves: No cranial nerve deficit.     Sensory: No sensory deficit.     Motor: No weakness.     Gait: Gait normal.   Psychiatric:        Mood and Affect: Mood normal.     (all labs ordered are listed, but only abnormal results are displayed) Labs Reviewed  COMPREHENSIVE METABOLIC PANEL WITH GFR - Abnormal; Notable for the following components:      Result Value   Sodium 134 (*)    Glucose, Bld 101 (*)    All other components within normal limits  CBC WITH DIFFERENTIAL/PLATELET - Abnormal; Notable for the following components:   WBC 11.8 (*)    Neutro Abs 9.1 (*)     All other components within normal limits  URINALYSIS, ROUTINE W REFLEX MICROSCOPIC - Abnormal; Notable for the following components:   Color, Urine STRAW (*)    Bacteria, UA RARE (*)    All other components within normal limits  TROPONIN I (HIGH SENSITIVITY)    EKG: None  Radiology: MR BRAIN W WO CONTRAST Result Date: 03/01/2024 CLINICAL DATA:  Cerebral vasospasm suspected dizziness EXAM: MRI HEAD WITHOUT AND WITH CONTRAST TECHNIQUE: Multiplanar, multiecho pulse sequences of the brain and surrounding structures were obtained  without and with intravenous contrast. CONTRAST:  5mL GADAVIST  GADOBUTROL  1 MMOL/ML IV SOLN COMPARISON:  CTA head/neck from earlier today. MRIBrain Jan 16, 2020. FINDINGS: Brain: No acute infarction, hemorrhage, hydrocephalus, extra-axial collection or mass lesion. Mild for age patchy T2/FLAIR hyperintensities in the white matter, compatible with chronic microvascular ischemic disease. No abnormal enhancement. Vascular: Major arterial flow voids are maintained at the skull base. Skull and upper cervical spine: Normal marrow signal. Sinuses/Orbits: Mostly clear sinuses.  No acute orbital findings. Other: No mastoid effusions. IMPRESSION: No evidence of acute intracranial abnormality. Electronically Signed   By: Gilmore GORMAN Molt M.D.   On: 03/01/2024 21:42   CT ANGIO HEAD NECK W WO CM Result Date: 03/01/2024 CLINICAL DATA:  Vertigo, central.  Headache and dizziness. EXAM: CT ANGIOGRAPHY HEAD AND NECK WITH AND WITHOUT CONTRAST TECHNIQUE: Multidetector CT imaging of the head and neck was performed using the standard protocol during bolus administration of intravenous contrast. Multiplanar CT image reconstructions and MIPs were obtained to evaluate the vascular anatomy. Carotid stenosis measurements (when applicable) are obtained utilizing NASCET criteria, using the distal internal carotid diameter as the denominator. RADIATION DOSE REDUCTION: This exam was performed according to  the departmental dose-optimization program which includes automated exposure control, adjustment of the mA and/or kV according to patient size and/or use of iterative reconstruction technique. CONTRAST:  75mL OMNIPAQUE  IOHEXOL  350 MG/ML SOLN COMPARISON:  MRI brain 01/16/2020.  CTA head 09/03/2018. FINDINGS: CT HEAD FINDINGS Brain: No acute intracranial hemorrhage. No CT evidence of acute infarct. Nonspecific hypoattenuation in the periventricular and subcortical white matter favored to reflect chronic microvascular ischemic changes. No edema, mass effect, or midline shift. The basilar cisterns are patent. Ventricles: Ventricles are normal in size and configuration. Vascular: No hyperdense vessel. Intracranial atherosclerotic calcifications noted. Skull: No acute or aggressive finding. Scattered lucent foci in the calvarium are unchanged since 2019. Sinuses/orbits: Bilateral lens replacement. Mucosal thickening in the left maxillary sinus with thickening of the sinus walls suggestive of mucoperiosteal reaction. Other: Mastoid air cells are clear. CTA NECK FINDINGS Aortic arch: Standard configuration of the aortic arch. Imaged portion shows no evidence of aneurysm or dissection. Mild atherosclerosis. No significant stenosis of the major arch vessel origins. Pulmonary arteries: As permitted by contrast timing, there are no filling defects in the visualized pulmonary arteries. Subclavian arteries: Patent bilaterally. Atherosclerosis at the proximal aspect of both subclavian arteries without significant stenosis. Right carotid system: Patent from the origin to the skull base. Mild tortuosity of the common carotid artery. Mild noncalcified atherosclerosis at the carotid bifurcation. No hemodynamically significant stenosis. Mild calcified atherosclerosis of the distal cervical ICA. Left carotid system: Patent from the origin to the skull base. Partial retropharyngeal course of the distal common carotid artery. Mild  calcified atherosclerosis at the carotid bifurcation without hemodynamically significant stenosis. Vertebral arteries: Codominant. No evidence of dissection, stenosis (50% or greater), or occlusion. Tortuosity of the left V1 segment. Skeleton: No acute findings. Degenerative changes in the cervical spine. Disc space narrowing most pronounced at C6-7. Additional asymmetric degenerative changes of the left atlantoaxial articulation. Other neck: The visualized airway is patent. No cervical lymphadenopathy. Heterogeneous appearance of the thyroid  with multiple nodules which measure up to 0.8 cm in diameter. Upper chest: Emphysema in the visualized lungs. Review of the MIP images confirms the above findings CTA HEAD FINDINGS ANTERIOR CIRCULATION: The intracranial ICAs are patent bilaterally. Atherosclerosis of the bilateral carotid siphons. Mild stenosis of the bilateral cavernous ICAs. No high-grade stenosis, proximal occlusion, aneurysm, or vascular malformation.  MCAs: The middle cerebral arteries are patent bilaterally. Similar tortuosity of the M1 segments most pronounced on the right. ACAs: The anterior cerebral arteries are patent bilaterally. POSTERIOR CIRCULATION: No significant stenosis, proximal occlusion, aneurysm, or vascular malformation. PCAs: Patent bilaterally. Focal moderate stenosis of the anterior P2 segment right PCA which is increased from prior. Similar tortuosity of the left P1 segment. Pcomm: Visualized on the left. SCAs: The superior cerebellar arteries are patent bilaterally. Basilar artery: Patent AICAs: Visualized on the right. PICAs: Visualized on the left. Vertebral arteries: The intracranial vertebral arteries are patent. Venous sinuses: As permitted by contrast timing, patent. Anatomic variants: None Review of the MIP images confirms the above findings IMPRESSION: No large vessel occlusion. Focal moderate stenosis of the P2 segment right PCA which is increased from prior. No  hemodynamically significant stenosis of the arteries in the neck. Similar tortuosity of the intracranial arterial vasculature most pronounced in the left P1 segment and right M1 segment. No CT evidence of acute intracranial abnormality. Mild chronic microvascular ischemic changes. Aortic Atherosclerosis (ICD10-I70.0) and Emphysema (ICD10-J43.9). Electronically Signed   By: Donnice Mania M.D.   On: 03/01/2024 19:08     Procedures    Medications Ordered in the ED  losartan  (COZAAR ) tablet 25 mg (has no administration in time range)  iohexol  (OMNIPAQUE ) 350 MG/ML injection 75 mL (75 mLs Intravenous Contrast Given 03/01/24 1808)  gadobutrol  (GADAVIST ) 1 MMOL/ML injection 5 mL (5 mLs Intravenous Contrast Given 03/01/24 142)    89 year old here for evaluation of elevated blood pressure.  On arrival she is afebrile, nonseptic, non-ill-appearing.  Initially denies history of hypertension however seems she is more so well-controlled on Cozaar  and atenolol .  Has had a headache and some dizziness over the last few days, chronic history of vertigo, she has difficult time describing this.  Here she has a nonfocal neuroexam without deficits.  She was seen in the emergency department 2 days ago for similar symptoms however did not have any head imaging performed.  Will plan on basic labs.  No chest pain, shortness of breath.  She denies any recent head injury.  No anticoagulation, LOC.   Labs and imaging personally viewed and interpreted:  CBC leukocytosis 11.8 CMP sodium 134 Trop 13 UA negative for infection CTA head/no LVO, focal stenosis P2 right PCA increased from prior imaging  Patient reassessed.  No current dizziness.  Blood pressures elevated.  Will get MRI brain.  MRI brain without significant abnormality.  She is quite concerned about her blood pressure she is currently asymptomatic.  Will have her increase her BP meds as needed.  She states she has a nurse at her facility that can take her blood  pressure.  She also does note some increased urinary frequency, no infection, no hyperglycemia, no abdominal pain, hematuria.  Will start her on Azo.  The patient has been appropriately medically screened and/or stabilized in the ED. I have low suspicion for any other emergent medical condition which would require further screening, evaluation or treatment in the ED or require inpatient management.  Patient is hemodynamically stable and in no acute distress.  Patient able to ambulate in department prior to ED.  Evaluation does not show acute pathology that would require ongoing or additional emergent interventions while in the emergency department or further inpatient treatment.  I have discussed the diagnosis with the patient and answered all questions.  Pain is been managed while in the emergency department and patient has no further complaints prior to discharge.  Patient is comfortable with plan discussed in room and is stable for discharge at this time.  I have discussed strict return precautions for returning to the emergency department.  Patient was encouraged to follow-up with PCP/specialist refer to at discharge.                                   Medical Decision Making Amount and/or Complexity of Data Reviewed Independent Historian:     Details: Family at bedside External Data Reviewed: labs, radiology, ECG and notes. Labs: ordered. Decision-making details documented in ED Course. Radiology: ordered and independent interpretation performed. Decision-making details documented in ED Course. ECG/medicine tests: ordered and independent interpretation performed. Decision-making details documented in ED Course.  Risk OTC drugs. Prescription drug management. Parenteral controlled substances. Decision regarding hospitalization. Diagnosis or treatment significantly limited by social determinants of health.        Final diagnoses:  Dizziness  Hypertension, unspecified type  Urinary  frequency    ED Discharge Orders          Ordered    phenazopyridine (PYRIDIUM) 95 MG tablet  2 times daily PRN        03/01/24 2256               Peggy Monk A, PA-C 03/01/24 2300    Patsey Lot, MD 03/02/24 1434

## 2024-03-01 NOTE — Discharge Instructions (Addendum)
 It was a pleasure taking care of you here today  If your blood pressure systolic-top number is greater than 160 then take an extra Cozaar  for a total of 50 mg.   I have written you for a medication called Pyridium to help with your urinary frequency  Follow-up with your primary care doctor early next week  Return for new or worsening symptoms

## 2024-03-01 NOTE — ED Triage Notes (Signed)
 Pt to ED c/o hypertension and low sodium, reports had blood taken this morning.

## 2024-03-01 NOTE — ED Provider Triage Note (Signed)
 Emergency Medicine Provider Triage Evaluation Note  CHRISMA HURLOCK , a 89 y.o. female  was evaluated in triage.  Pt complains of elevated BP. Pt noticed her BP has been elevated when she checked with her cuff.  Sxs ongoing for the past few days.  Was seen at [facility] ER 2 days ago and was told her sodium is low.  Her BP still elevated and pt voice concerns.  Requesting for further eval.  No new sxs.    Review of Systems  Positive: As above Negative: As above  Physical Exam  BP (!) 203/113 (BP Location: Right Arm)   Pulse 92   Temp 98 F (36.7 C)   Resp 16   Ht 5' (1.524 m)   Wt 49 kg   SpO2 95%   BMI 21.09 kg/m  Gen:   Awake, no distress   Resp:  Normal effort  MSK:   Moves extremities without difficulty  Other:    Medical Decision Making  Medically screening exam initiated at 2:17 PM.  Appropriate orders placed.  LAILYN APPELBAUM was informed that the remainder of the evaluation will be completed by another provider, this initial triage assessment does not replace that evaluation, and the importance of remaining in the ED until their evaluation is complete.     Nivia Colon, PA-C 03/01/24 1423

## 2024-03-06 DIAGNOSIS — R42 Dizziness and giddiness: Secondary | ICD-10-CM | POA: Diagnosis not present

## 2024-03-06 DIAGNOSIS — I10 Essential (primary) hypertension: Secondary | ICD-10-CM | POA: Diagnosis not present

## 2024-03-06 DIAGNOSIS — E871 Hypo-osmolality and hyponatremia: Secondary | ICD-10-CM | POA: Diagnosis not present

## 2024-03-06 DIAGNOSIS — R413 Other amnesia: Secondary | ICD-10-CM | POA: Diagnosis not present

## 2024-03-07 ENCOUNTER — Emergency Department (HOSPITAL_COMMUNITY)

## 2024-03-07 ENCOUNTER — Emergency Department (HOSPITAL_COMMUNITY)
Admission: EM | Admit: 2024-03-07 | Discharge: 2024-03-07 | Disposition: A | Discharge status: Home / Self Care | Attending: Student | Admitting: Student

## 2024-03-07 ENCOUNTER — Encounter (HOSPITAL_COMMUNITY): Payer: Self-pay | Admitting: *Deleted

## 2024-03-07 ENCOUNTER — Other Ambulatory Visit: Payer: Self-pay

## 2024-03-07 DIAGNOSIS — E039 Hypothyroidism, unspecified: Secondary | ICD-10-CM | POA: Diagnosis not present

## 2024-03-07 DIAGNOSIS — I10 Essential (primary) hypertension: Secondary | ICD-10-CM | POA: Diagnosis not present

## 2024-03-07 DIAGNOSIS — J449 Chronic obstructive pulmonary disease, unspecified: Secondary | ICD-10-CM | POA: Diagnosis not present

## 2024-03-07 DIAGNOSIS — Z85828 Personal history of other malignant neoplasm of skin: Secondary | ICD-10-CM | POA: Diagnosis not present

## 2024-03-07 DIAGNOSIS — F17210 Nicotine dependence, cigarettes, uncomplicated: Secondary | ICD-10-CM | POA: Diagnosis not present

## 2024-03-07 DIAGNOSIS — Z79899 Other long term (current) drug therapy: Secondary | ICD-10-CM | POA: Insufficient documentation

## 2024-03-07 DIAGNOSIS — R2681 Unsteadiness on feet: Secondary | ICD-10-CM | POA: Diagnosis not present

## 2024-03-07 DIAGNOSIS — R519 Headache, unspecified: Secondary | ICD-10-CM | POA: Insufficient documentation

## 2024-03-07 LAB — COMPREHENSIVE METABOLIC PANEL WITH GFR
ALT: 19 U/L (ref 0–44)
AST: 26 U/L (ref 15–41)
Albumin: 3.9 g/dL (ref 3.5–5.0)
Alkaline Phosphatase: 48 U/L (ref 38–126)
Anion gap: 14 (ref 5–15)
BUN: 20 mg/dL (ref 8–23)
CO2: 21 mmol/L — ABNORMAL LOW (ref 22–32)
Calcium: 9.8 mg/dL (ref 8.9–10.3)
Chloride: 101 mmol/L (ref 98–111)
Creatinine, Ser: 0.79 mg/dL (ref 0.44–1.00)
GFR, Estimated: >60 mL/min (ref >60)
Glucose, Bld: 113 mg/dL — ABNORMAL HIGH (ref 70–99)
Potassium: 3.8 mmol/L (ref 3.5–5.1)
Sodium: 136 mmol/L (ref 135–145)
Total Bilirubin: 0.5 mg/dL (ref 0.0–1.2)
Total Protein: 6.9 g/dL (ref 6.5–8.1)

## 2024-03-07 LAB — CBC
HCT: 43 % (ref 36.0–46.0)
Hemoglobin: 14.6 g/dL (ref 12.0–15.0)
MCH: 31.7 pg (ref 26.0–34.0)
MCHC: 34 g/dL (ref 30.0–36.0)
MCV: 93.5 fL (ref 80.0–100.0)
Platelets: 239 10*3/uL (ref 150–400)
RBC: 4.6 MIL/uL (ref 3.87–5.11)
RDW: 14.7 % (ref 11.5–15.5)
WBC: 9.5 10*3/uL (ref 4.0–10.5)
nRBC: 0 % (ref 0.0–0.2)

## 2024-03-07 LAB — URINALYSIS, ROUTINE W REFLEX MICROSCOPIC
Bacteria, UA: NONE SEEN
Bilirubin Urine: NEGATIVE
Glucose, UA: NEGATIVE mg/dL
Hgb urine dipstick: NEGATIVE
Ketones, ur: NEGATIVE mg/dL
Nitrite: NEGATIVE
Protein, ur: NEGATIVE mg/dL
Specific Gravity, Urine: 1.004 — ABNORMAL LOW (ref 1.005–1.030)
pH: 6 (ref 5.0–8.0)

## 2024-03-07 LAB — TROPONIN I (HIGH SENSITIVITY)
Troponin I (High Sensitivity): 12 ng/L (ref <18)
Troponin I (High Sensitivity): 20 ng/L — ABNORMAL HIGH (ref <18)

## 2024-03-07 LAB — LIPASE, BLOOD: Lipase: 66 U/L — ABNORMAL HIGH (ref 11–51)

## 2024-03-07 MED ORDER — LOSARTAN POTASSIUM 25 MG PO TABS: 25.0000 mg | ORAL_TABLET | Freq: Two times a day (BID) | ORAL | 0 refills | Status: AC

## 2024-03-07 NOTE — ED Provider Notes (Signed)
 [place] EMERGENCY DEPARTMENT AT [facility] Provider Note  CSN: [id number] Arrival date & time: 03/07/24 9970  Chief Complaint(s) Hypertension  HPI Mary Barker is a 89 y.o. female with PMH HTN, COPD, giant cell arteritis who presents emerged part for evaluation of isolated hypertension.  Patient states that she checked her blood pressure at home and systolics were greater than 200.  She had no associated numbness, tingling, weakness, chest pain, shortness of breath, nausea, vomiting or other systemic or neurologic complaints at that time.  Accompanied by patient's son who states that she was acting mildly confused at that time but this has resolved here in the emergency department.  She states she primarily came to the emergency room because she has an appointment with vestibular PT today at 1600 and is worried that they will not treat her if her blood pressure is high as she has been previously told that she would have a stroke if her blood pressure was too high.   Past Medical History Past Medical History:  Diagnosis Date   Actinic keratosis    Basal cell carcinoma 10/06/2015   Left nasal tip, basosquamous    Basal cell carcinoma ? 2014   R posterior scalp, MOHs   Basal cell carcinoma 09/12/2022   L post neck, [facility]C   Carpal tunnel syndrome    COPD (chronic obstructive pulmonary disease) (HCC)    Dyspnea    Family history of adverse reaction to anesthesia    sister hard time waking up   GCA (giant cell arteritis) (HCC)    H/O multiple pulmonary nodules    Hypertension    Hyponatremia    Hypothyroidism    Osteoarthritis    Seasonal allergies    Squamous cell carcinoma of skin 03/16/2021   R upper pretibia, [facility]   Thyroid  disease    Tingling in extremities    Patient Active Problem List   Diagnosis Date Noted   SBO (small bowel obstruction) (HCC) 04/18/2019   Status post total hip replacement, 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 Authorizing Provider  acetaminophen  (TYLENOL ) 500 MG tablet Take 500 mg by mouth at bedtime.    [provider]  alendronate  (FOSAMAX ) 70 MG tablet Take 70 mg by mouth every Sunday.    [provider]  atenolol  (TENORMIN ) 25 MG tablet Take 25 mg by mouth every other day.    [provider]  fluticasone  (FLONASE ) 50 MCG/ACT nasal spray Place 2 sprays into both nostrils at bedtime.    [provider]  levothyroxine  (SYNTHROID , LEVOTHROID) 125 MCG tablet Take 125 mcg by mouth See admin instructions. Take 1 tablet (125mcg) by mouth every day except Wednesday and Sunday (5 days a week). Take first thing in the morning before breakfast and other medications.    [provider]  losartan  (COZAAR ) 25 MG tablet Take 1 tablet (25 mg total) by mouth in the morning and at bedtime. 03/07/24 04/06/24  Aamari West, MD  montelukast  (SINGULAIR ) 10 MG tablet Take 10 mg by mouth at bedtime.    [provider]  Multiple Vitamins-Minerals (PRESERVISION AREDS 2) CAPS Take 1 capsule by mouth 2 (two) times daily.    [provider]  OVER THE COUNTER MEDICATION Take 1 capsule by mouth See admin instructions. [facility]: Dynamic Brain supplement. Take 1 capsule by mouth  twice daily, after lunch and before bed.    [provider]  phenazopyridine (PYRIDIUM) 95 MG tablet Take 1 tablet (95 mg total) by mouth 2 (two) times daily as needed for pain. 03/01/24   Henderly, Britni A, PA-C  spironolactone  (ALDACTONE ) 25 MG tablet Take 25 mg by mouth daily.    [provider]  timolol (BETIMOL) 0.5 % ophthalmic solution Place 1 drop into both eyes daily after supper.    [provider]  Varenicline Tartrate (TYRVAYA) 0.03 MG/ACT SOLN Place 1 spray into right nostril daily after breakfast.     [provider]                                                                                                                                    Past Surgical History Past Surgical History:  Procedure Laterality Date   APPENDECTOMY     ARTERY BIOPSY Left 07/31/2018   Procedure: BIOPSY TEMPORAL ARTERY;  Surgeon: Tye Millet, DO;  Location: [facility] ORS;  Service: General;  Laterality: Left;   BREAST EXCISIONAL BIOPSY Left 1954   neg   BREAST SURGERY     COLONOSCOPY     DILATION AND CURETTAGE OF UTERUS     EYE SURGERY Bilateral    cataract   KNEE ARTHROSCOPY     LAPAROTOMY N/A 04/20/2019   Procedure: EXPLORATORY LAPAROTOMY Small Bowel Obstruction;  Surgeon: Jordis Laneta FALCON, MD;  Location: [facility] ORS;  Service: General;  Laterality: N/A;   NASAL POLYP SURGERY     TONSILLECTOMY     TOTAL HIP ARTHROPLASTY Right 03/26/2019   Procedure: RIGHT TOTAL HIP ARTHROPLASTY;  Surgeon: Kathlynn Sharper, MD;  Location: [facility] ORS;  Service: Orthopedics;  Laterality: Right;   Family History Family History  Problem Relation Age of Onset   CAD Father    CAD Sister    CAD Brother     Social History Social History   Tobacco Use   Smoking status: Former    Current packs/day: 0.00    Types: Cigarettes    Quit date: 03/19/1981    Years since quitting: 42.9   Smokeless tobacco: Never  Vaping Use   Vaping status: Never Used  Substance Use Topics   Alcohol  use: Yes    Alcohol /week: 3.0 standard drinks of alcohol     Types: 1 Glasses of wine, 2 Shots of liquor per week    Comment: pt states one drink a day   Drug use: No   Allergies Codeine  Review of Systems Review of Systems  All other systems reviewed and are negative.   Physical Exam Vital Signs  I have reviewed the triage vital signs BP (!) 169/104 (BP Location: Right Arm)   Pulse 94   Temp (!) 97.4 F (36.3 C) (Oral)   Resp 16   Ht 5' (1.524 m)   Wt 49 kg   SpO2 99%   BMI 21.10 kg/m  Physical Exam Vitals and nursing note  reviewed.  Constitutional:      General: She is not in acute distress.    Appearance: She is well-developed.  HENT:     Head: Normocephalic and atraumatic.  Eyes:     Conjunctiva/sclera: Conjunctivae normal.  Cardiovascular:     Rate and Rhythm: Normal rate and regular rhythm.     Heart sounds: No murmur heard. Pulmonary:     Effort: Pulmonary effort is normal. No respiratory distress.     Breath sounds: Normal breath sounds.  Abdominal:     Palpations: Abdomen is soft.     Tenderness: There is no abdominal tenderness.  Musculoskeletal:        General: No swelling.     Cervical back: Neck supple.  Skin:    General: Skin is warm and dry.     Capillary Refill: Capillary refill takes less than 2 seconds.  Neurological:     Mental Status: She is alert.     Cranial Nerves: No cranial nerve deficit.     Sensory: No sensory deficit.     Motor: No weakness.  Psychiatric:        Mood and Affect: Mood normal.     ED Results and Treatments Labs (all labs ordered are listed, but only abnormal results are displayed) Labs Reviewed  LIPASE, BLOOD - Abnormal; Notable for the following components:      Result Value   Lipase 66 (*)    All other components within normal limits  COMPREHENSIVE METABOLIC PANEL WITH GFR - Abnormal; Notable for the following components:   CO2 21 (*)    Glucose, Bld 113 (*)    All other components within normal limits  URINALYSIS, ROUTINE W REFLEX MICROSCOPIC - Abnormal; Notable for the following components:   Color, Urine STRAW (*)    Specific Gravity, Urine 1.004 (*)    Leukocytes,Ua TRACE (*)    All other components within normal limits  TROPONIN I (HIGH SENSITIVITY) - Abnormal; Notable for the following components:   Troponin I (High Sensitivity) 20 (*)    All other components within normal limits  CBC  TROPONIN I (HIGH SENSITIVITY)                                                                                                                           Radiology CT Head Wo Contrast Result Date: 03/07/2024 CLINICAL DATA:  Headache, increasing frequency or severity. EXAM: CT HEAD WITHOUT CONTRAST TECHNIQUE: Contiguous axial images were obtained from the base of the skull through the vertex without intravenous contrast. RADIATION DOSE REDUCTION: This exam was performed according to the departmental dose-optimization program which includes automated exposure control, adjustment of the mA and/or kV according to patient size and/or use of iterative reconstruction technique. COMPARISON:  MRI head and CTA head/neck on March 01, 2024. FINDINGS: Brain: No evidence of acute infarction, hemorrhage, hydrocephalus, extra-axial collection or mass lesion/mass effect. Patchy white matter hypodensities are nonspecific but  compatible with chronic microvascular ischemic disease. Vascular: No hyperdense vessel. Skull: Normal. Negative for fracture or focal lesion. Sinuses/Orbits: Clear sinuses.  No acute orbital findings. IMPRESSION: No evidence of acute intracranial abnormality. Electronically Signed   By: Gilmore GORMAN Molt M.D.   On: 03/07/2024 03:03    Pertinent labs & imaging results that were available during my care of the patient were reviewed by me and considered in my medical decision making (see MDM for details).  Medications Ordered in ED Medications - No data to display                                                                                                                                   Procedures Procedures  (including critical care time)  Medical Decision Making / ED Course   This patient presents to the ED for concern of high blood pressure, this involves an extensive number of treatment options, and is a complaint that carries with it a high risk of complications and morbidity.  The differential diagnosis includes primary hypertension, secondary hypertension, hypertensive urgency, hypertensive emergency  MDM: Patient seen emergency  room for evaluation of high blood pressure.  Physical exam is unremarkable with no focal motor or sensory deficits.  No cranial nerve deficits.  Cardiopulmonary exam unremarkable.  No lower extremity edema.  Laboratory evaluation largely unremarkable outside of a very mild delta high-sensitivity opponent elevation from 12to 20.  ECG is nonischemic.  Blood pressure resolved in the emergency room and without intervention, likely secondary to the additional dose of Cozaar  patient came prior to ER presentation.  With an extensive discussion about high blood pressure in the emergency department including reasons for ER presentation versus outpatient follow-up.  Current ER literature in accordance with the most recent AHA scientific statement advises against aggressive blood pressure control in the emergency department in the absence of symptoms as this is not shown to have clinical benefit and should be managed in the outpatient setting Luanne et al 2023, Bress et al 2024).  Patient recently had the exact same presentation with an associated headache and had a recent MRI imaging which is reassuringly negative and thus we will not repeat MRI imaging in the ER today.  CT head is unremarkable.  We will trial twice daily Cozaar  instead of daily Cozaar  but patient encouraged to talk to her primary care physician about this plan.  We also had an extensive discussion surrounding return precautions and patient understands that if she is to have any symptoms associate with high blood pressure she will return to the emergency department.  I placed instructions in her discharge summary highlighting the [facility] scientific statement listed above and encouraging her outpatient physical therapy riders not to delay symptomatic care for isolated high blood pressure in the absence of symptoms.  At this time she does not meet inpatient criteria for admission will be discharged with outpatient follow-up.    Additional history  obtained: -Additional history obtained from son -External records from outside source obtained and reviewed including: Chart review including previous notes, labs, imaging, consultation notes   Lab Tests: -I ordered, reviewed, and interpreted labs.   The pertinent results include:   Labs Reviewed  LIPASE, BLOOD - Abnormal; Notable for the following components:      Result Value   Lipase 66 (*)    All other components within normal limits  COMPREHENSIVE METABOLIC PANEL WITH GFR - Abnormal; Notable for the following components:   CO2 21 (*)    Glucose, Bld 113 (*)    All other components within normal limits  URINALYSIS, ROUTINE W REFLEX MICROSCOPIC - Abnormal; Notable for the following components:   Color, Urine STRAW (*)    Specific Gravity, Urine 1.004 (*)    Leukocytes,Ua TRACE (*)    All other components within normal limits  TROPONIN I (HIGH SENSITIVITY) - Abnormal; Notable for the following components:   Troponin I (High Sensitivity) 20 (*)    All other components within normal limits  CBC  TROPONIN I (HIGH SENSITIVITY)      EKG   EKG Interpretation Date/Time:  Thursday March 07 2024 01:05:45 EDT Ventricular Rate:  86 PR Interval:  158 QRS Duration:  70 QT Interval:  368 QTC Calculation: 440 R Axis:   39  Text Interpretation: Normal sinus rhythm When compared with ECG of 01-Mar-2024 14:51, PREVIOUS ECG IS PRESENT Confirmed by Opha Mcghee ([id number]) on 03/07/2024 6:01:11 AM         Imaging Studies ordered: I ordered imaging studies including CT head I independently visualized and interpreted imaging. I agree with the radiologist interpretation   Medicines ordered and prescription drug management: Meds ordered this encounter  Medications   losartan  (COZAAR ) 25 MG tablet    Sig: Take 1 tablet (25 mg total) by mouth in the morning and at bedtime.    Dispense:  60 tablet    Refill:  0    -I have reviewed the patients home medicines and have made adjustments as  needed  Critical interventions none    Cardiac Monitoring: The patient was maintained on a cardiac monitor.  I personally viewed and interpreted the cardiac monitored which showed an underlying rhythm of: NSR  Social Determinants of Health:  Factors impacting patients care include: none   Reevaluation: After the interventions noted above, I reevaluated the patient and found that they have :improved  Co morbidities that complicate the patient evaluation  Past Medical History:  Diagnosis Date   Actinic keratosis    Basal cell carcinoma 10/06/2015   Left nasal tip, basosquamous    Basal cell carcinoma ? 2014   R posterior scalp, MOHs   Basal cell carcinoma 09/12/2022   L post neck, [facility]   Carpal tunnel syndrome    COPD (chronic obstructive pulmonary disease) (HCC)    Dyspnea    Family history of adverse reaction to anesthesia    sister hard time waking up   GCA (giant cell arteritis) (HCC)    H/O multiple pulmonary nodules    Hypertension    Hyponatremia    Hypothyroidism    Osteoarthritis    Seasonal allergies    Squamous cell carcinoma of skin 03/16/2021   R upper pretibia, [facility]   Thyroid  disease    Tingling in extremities       Dispostion: I considered admission for this patient, but at this time she does not meet inpatient criteria for admission will be discharged with  outpatient follow-up.     Final Clinical Impression(s) / ED Diagnoses Final diagnoses:  Primary hypertension     @PCDICTATION @    Albertina Dixon, MD 03/07/24 [phone]

## 2024-03-07 NOTE — ED Triage Notes (Signed)
 The pt reports that she has  had dizziness and high bp  she was here Friday and had everything done and they did not find anything  tonight she has the same symptoms heart racing hypertension and dizziness

## 2024-03-07 NOTE — ED Notes (Signed)
Awaiting patient placement in room.

## 2024-03-07 NOTE — ED Provider Triage Note (Signed)
 Emergency Medicine Provider Triage Evaluation Note  Mary Barker , a 89 y.o. female  was evaluated in triage.  Pt complains of dizziness and headache, with everything the same as when she was seen in ED 6/27. Reports having more confusion for last week. Had MRI and CTA previously, negative. Has taken all her BP medications. Took cozaar  at 23:30 this evening.   Review of Systems  Positive: N/a Negative: N/a  Physical Exam  BP (!) 200/96 (BP Location: Left Arm)   Pulse 92   Temp 97.8 F (36.6 C)   Resp 18   Ht 5' (1.524 m)   Wt 49 kg   SpO2 93%   BMI 21.10 kg/m  Gen:   Awake, no distress   Resp:  Normal effort  MSK:   Moves extremities without difficulty  Other:    Medical Decision Making  Medically screening exam initiated at 1:35 AM.  Appropriate orders placed.  LE FERRAZ was informed that the remainder of the evaluation will be completed by another provider, this initial triage assessment does not replace that evaluation, and the importance of remaining in the ED until their evaluation is complete.     Beola Terrall RAMAN, [place] 03/07/24 [phone]

## 2024-03-07 NOTE — Discharge Instructions (Addendum)
 You were seen in the emergency room for evaluation of high blood pressure.  Your workup was reassuringly normal in emergency department and you were seen recently with negative MRI imaging.  Current cardiology and emergency department literature advises against emergency room presentation for isolated high blood pressure without symptoms as this has been shown to worsen patient's outcomes when aggressively managed in the emergency setting.  High blood pressure is a chronic problem and needs to be managed in the outpatient setting as this will need to be fixed slowly likely over the course of a month or more.  More importantly, there is no upper cutoff for blood pressure that demands coming to the emergency department if you do not have any associated symptoms such as chest pain, shortness of breath, headache, visual difficulties, vomiting, etc.  This should not delay care including vestibular PT no matter what your blood pressure is.  Your blood pressure has been extensively evaluated in the emergency department setting and resolution of your vertigo very well may improve your blood pressure as this would decrease the uncomfortable feeling that is likely leading to cortisol release and ultimate hypertension.   To patient's outpatient providers, current ER literature in accordance with the most recent AHA scientific statement advises against aggressive blood pressure control in the emergency department in the absence of symptoms as this is not shown to have clinical benefit and should be managed in the outpatient setting Luanne et al 2023, Bress et al 2024).  Thus, please do not delay patient's outpatient care for isolated high blood pressure without symptoms.   That being said, it is a very different scenario if you are experiencing symptoms as described above.  Return to the emergency department if you do have any chest pain, shortness of breath, vomiting, visual deficits, numbness, tingling, weakness or  other concerning symptoms.

## 2024-03-11 DIAGNOSIS — R2681 Unsteadiness on feet: Secondary | ICD-10-CM | POA: Diagnosis not present

## 2024-03-12 DIAGNOSIS — R42 Dizziness and giddiness: Secondary | ICD-10-CM | POA: Diagnosis not present

## 2024-03-12 DIAGNOSIS — I6629 Occlusion and stenosis of unspecified posterior cerebral artery: Secondary | ICD-10-CM | POA: Diagnosis not present

## 2024-03-12 DIAGNOSIS — E569 Vitamin deficiency, unspecified: Secondary | ICD-10-CM | POA: Diagnosis not present

## 2024-03-12 DIAGNOSIS — F419 Anxiety disorder, unspecified: Secondary | ICD-10-CM | POA: Diagnosis not present

## 2024-03-12 DIAGNOSIS — G5603 Carpal tunnel syndrome, bilateral upper limbs: Secondary | ICD-10-CM | POA: Diagnosis not present

## 2024-03-15 DIAGNOSIS — R4182 Altered mental status, unspecified: Secondary | ICD-10-CM | POA: Diagnosis not present

## 2024-03-15 DIAGNOSIS — R0602 Shortness of breath: Secondary | ICD-10-CM | POA: Diagnosis not present

## 2024-03-15 DIAGNOSIS — R42 Dizziness and giddiness: Secondary | ICD-10-CM | POA: Diagnosis not present

## 2024-03-15 DIAGNOSIS — E039 Hypothyroidism, unspecified: Secondary | ICD-10-CM | POA: Diagnosis not present

## 2024-03-15 DIAGNOSIS — I10 Essential (primary) hypertension: Secondary | ICD-10-CM | POA: Diagnosis not present

## 2024-03-19 DIAGNOSIS — H353122 Nonexudative age-related macular degeneration, left eye, intermediate dry stage: Secondary | ICD-10-CM | POA: Diagnosis not present

## 2024-03-19 DIAGNOSIS — H16223 Keratoconjunctivitis sicca, not specified as Sjogren's, bilateral: Secondary | ICD-10-CM | POA: Diagnosis not present

## 2024-03-19 DIAGNOSIS — H353221 Exudative age-related macular degeneration, left eye, with active choroidal neovascularization: Secondary | ICD-10-CM | POA: Diagnosis not present

## 2024-03-19 DIAGNOSIS — H47012 Ischemic optic neuropathy, left eye: Secondary | ICD-10-CM | POA: Diagnosis not present

## 2024-03-19 DIAGNOSIS — H353211 Exudative age-related macular degeneration, right eye, with active choroidal neovascularization: Secondary | ICD-10-CM | POA: Diagnosis not present

## 2024-03-20 DIAGNOSIS — Z1331 Encounter for screening for depression: Secondary | ICD-10-CM | POA: Diagnosis not present

## 2024-03-20 DIAGNOSIS — I10 Essential (primary) hypertension: Secondary | ICD-10-CM | POA: Diagnosis not present

## 2024-03-20 DIAGNOSIS — R0602 Shortness of breath: Secondary | ICD-10-CM | POA: Diagnosis not present

## 2024-03-21 DIAGNOSIS — M65341 Trigger finger, right ring finger: Secondary | ICD-10-CM | POA: Diagnosis not present

## 2024-03-21 DIAGNOSIS — I951 Orthostatic hypotension: Secondary | ICD-10-CM | POA: Diagnosis not present

## 2024-03-21 DIAGNOSIS — M316 Other giant cell arteritis: Secondary | ICD-10-CM | POA: Diagnosis not present

## 2024-03-21 DIAGNOSIS — F0394 Unspecified dementia, unspecified severity, with anxiety: Secondary | ICD-10-CM | POA: Diagnosis not present

## 2024-03-21 DIAGNOSIS — I10 Essential (primary) hypertension: Secondary | ICD-10-CM | POA: Diagnosis not present

## 2024-03-21 DIAGNOSIS — J439 Emphysema, unspecified: Secondary | ICD-10-CM | POA: Diagnosis not present

## 2024-03-21 DIAGNOSIS — I700 Atherosclerosis of aorta: Secondary | ICD-10-CM | POA: Diagnosis not present

## 2024-03-21 DIAGNOSIS — Z9181 History of falling: Secondary | ICD-10-CM | POA: Diagnosis not present

## 2024-03-21 DIAGNOSIS — Z87891 Personal history of nicotine dependence: Secondary | ICD-10-CM | POA: Diagnosis not present

## 2024-03-21 DIAGNOSIS — M810 Age-related osteoporosis without current pathological fracture: Secondary | ICD-10-CM | POA: Diagnosis not present

## 2024-03-21 DIAGNOSIS — Z96641 Presence of right artificial hip joint: Secondary | ICD-10-CM | POA: Diagnosis not present

## 2024-03-21 DIAGNOSIS — R918 Other nonspecific abnormal finding of lung field: Secondary | ICD-10-CM | POA: Diagnosis not present

## 2024-03-21 DIAGNOSIS — Z8701 Personal history of pneumonia (recurrent): Secondary | ICD-10-CM | POA: Diagnosis not present

## 2024-03-21 DIAGNOSIS — E039 Hypothyroidism, unspecified: Secondary | ICD-10-CM | POA: Diagnosis not present

## 2024-03-21 DIAGNOSIS — Z79899 Other long term (current) drug therapy: Secondary | ICD-10-CM | POA: Diagnosis not present

## 2024-03-21 DIAGNOSIS — H8113 Benign paroxysmal vertigo, bilateral: Secondary | ICD-10-CM | POA: Diagnosis not present

## 2024-03-22 DIAGNOSIS — R9431 Abnormal electrocardiogram [ECG] [EKG]: Secondary | ICD-10-CM | POA: Diagnosis not present

## 2024-03-22 DIAGNOSIS — I517 Cardiomegaly: Secondary | ICD-10-CM | POA: Diagnosis not present

## 2024-03-22 DIAGNOSIS — J439 Emphysema, unspecified: Secondary | ICD-10-CM | POA: Diagnosis not present

## 2024-03-22 DIAGNOSIS — I700 Atherosclerosis of aorta: Secondary | ICD-10-CM | POA: Diagnosis not present

## 2024-03-22 DIAGNOSIS — I10 Essential (primary) hypertension: Secondary | ICD-10-CM | POA: Diagnosis not present

## 2024-03-22 DIAGNOSIS — I672 Cerebral atherosclerosis: Secondary | ICD-10-CM | POA: Diagnosis not present

## 2024-03-28 DIAGNOSIS — I700 Atherosclerosis of aorta: Secondary | ICD-10-CM | POA: Diagnosis not present

## 2024-03-28 DIAGNOSIS — J439 Emphysema, unspecified: Secondary | ICD-10-CM | POA: Diagnosis not present

## 2024-03-28 DIAGNOSIS — F0394 Unspecified dementia, unspecified severity, with anxiety: Secondary | ICD-10-CM | POA: Diagnosis not present

## 2024-03-28 DIAGNOSIS — E039 Hypothyroidism, unspecified: Secondary | ICD-10-CM | POA: Diagnosis not present

## 2024-03-28 DIAGNOSIS — I951 Orthostatic hypotension: Secondary | ICD-10-CM | POA: Diagnosis not present

## 2024-03-28 DIAGNOSIS — I10 Essential (primary) hypertension: Secondary | ICD-10-CM | POA: Diagnosis not present

## 2024-03-28 DIAGNOSIS — H8113 Benign paroxysmal vertigo, bilateral: Secondary | ICD-10-CM | POA: Diagnosis not present

## 2024-04-05 DIAGNOSIS — M1990 Unspecified osteoarthritis, unspecified site: Secondary | ICD-10-CM | POA: Diagnosis not present

## 2024-04-05 DIAGNOSIS — K5900 Constipation, unspecified: Secondary | ICD-10-CM | POA: Diagnosis not present

## 2024-04-05 DIAGNOSIS — H353210 Exudative age-related macular degeneration, right eye, stage unspecified: Secondary | ICD-10-CM | POA: Diagnosis not present

## 2024-04-05 DIAGNOSIS — E039 Hypothyroidism, unspecified: Secondary | ICD-10-CM | POA: Diagnosis not present

## 2024-04-05 DIAGNOSIS — R32 Unspecified urinary incontinence: Secondary | ICD-10-CM | POA: Diagnosis not present

## 2024-04-05 DIAGNOSIS — M810 Age-related osteoporosis without current pathological fracture: Secondary | ICD-10-CM | POA: Diagnosis not present

## 2024-04-05 DIAGNOSIS — I10 Essential (primary) hypertension: Secondary | ICD-10-CM | POA: Diagnosis not present

## 2024-04-05 DIAGNOSIS — G5603 Carpal tunnel syndrome, bilateral upper limbs: Secondary | ICD-10-CM | POA: Diagnosis not present

## 2024-04-05 DIAGNOSIS — F3342 Major depressive disorder, recurrent, in full remission: Secondary | ICD-10-CM | POA: Diagnosis not present

## 2024-04-05 DIAGNOSIS — J309 Allergic rhinitis, unspecified: Secondary | ICD-10-CM | POA: Diagnosis not present

## 2024-04-05 DIAGNOSIS — R0602 Shortness of breath: Secondary | ICD-10-CM | POA: Diagnosis not present

## 2024-04-05 DIAGNOSIS — M316 Other giant cell arteritis: Secondary | ICD-10-CM | POA: Diagnosis not present

## 2024-04-11 DIAGNOSIS — R8290 Unspecified abnormal findings in urine: Secondary | ICD-10-CM | POA: Diagnosis not present

## 2024-04-11 DIAGNOSIS — I10 Essential (primary) hypertension: Secondary | ICD-10-CM | POA: Diagnosis not present

## 2024-04-23 DIAGNOSIS — E871 Hypo-osmolality and hyponatremia: Secondary | ICD-10-CM | POA: Diagnosis not present

## 2024-04-23 DIAGNOSIS — I10 Essential (primary) hypertension: Secondary | ICD-10-CM | POA: Diagnosis not present

## 2024-04-23 DIAGNOSIS — R42 Dizziness and giddiness: Secondary | ICD-10-CM | POA: Diagnosis not present

## 2024-04-23 DIAGNOSIS — J449 Chronic obstructive pulmonary disease, unspecified: Secondary | ICD-10-CM | POA: Diagnosis not present

## 2024-05-13 DIAGNOSIS — I10 Essential (primary) hypertension: Secondary | ICD-10-CM | POA: Diagnosis not present

## 2024-05-13 DIAGNOSIS — E871 Hypo-osmolality and hyponatremia: Secondary | ICD-10-CM | POA: Diagnosis not present

## 2024-05-14 DIAGNOSIS — I10 Essential (primary) hypertension: Secondary | ICD-10-CM | POA: Diagnosis not present

## 2024-05-14 DIAGNOSIS — Z23 Encounter for immunization: Secondary | ICD-10-CM | POA: Diagnosis not present

## 2024-05-14 DIAGNOSIS — G309 Alzheimer's disease, unspecified: Secondary | ICD-10-CM | POA: Diagnosis not present

## 2024-05-14 DIAGNOSIS — F419 Anxiety disorder, unspecified: Secondary | ICD-10-CM | POA: Diagnosis not present

## 2024-05-14 DIAGNOSIS — H8110 Benign paroxysmal vertigo, unspecified ear: Secondary | ICD-10-CM | POA: Diagnosis not present

## 2024-05-14 DIAGNOSIS — G5603 Carpal tunnel syndrome, bilateral upper limbs: Secondary | ICD-10-CM | POA: Diagnosis not present

## 2024-05-14 DIAGNOSIS — F0280 Dementia in other diseases classified elsewhere without behavioral disturbance: Secondary | ICD-10-CM | POA: Diagnosis not present

## 2024-06-11 DIAGNOSIS — H6123 Impacted cerumen, bilateral: Secondary | ICD-10-CM | POA: Diagnosis not present

## 2024-06-11 DIAGNOSIS — H903 Sensorineural hearing loss, bilateral: Secondary | ICD-10-CM | POA: Diagnosis not present

## 2024-06-24 DIAGNOSIS — E871 Hypo-osmolality and hyponatremia: Secondary | ICD-10-CM | POA: Diagnosis not present

## 2024-06-24 DIAGNOSIS — I10 Essential (primary) hypertension: Secondary | ICD-10-CM | POA: Diagnosis not present

## 2024-06-25 DIAGNOSIS — H353211 Exudative age-related macular degeneration, right eye, with active choroidal neovascularization: Secondary | ICD-10-CM | POA: Diagnosis not present

## 2024-06-27 ENCOUNTER — Emergency Department
Admission: EM | Admit: 2024-06-27 | Discharge: 2024-06-27 | Discharge status: Against Medical Advice | Source: Home / Self Care

## 2024-06-28 DIAGNOSIS — R490 Dysphonia: Secondary | ICD-10-CM | POA: Diagnosis not present

## 2024-07-11 DIAGNOSIS — H8110 Benign paroxysmal vertigo, unspecified ear: Secondary | ICD-10-CM | POA: Diagnosis not present

## 2024-07-11 DIAGNOSIS — R300 Dysuria: Secondary | ICD-10-CM | POA: Diagnosis not present

## 2024-07-11 DIAGNOSIS — F0280 Dementia in other diseases classified elsewhere without behavioral disturbance: Secondary | ICD-10-CM | POA: Diagnosis not present

## 2024-07-11 DIAGNOSIS — F419 Anxiety disorder, unspecified: Secondary | ICD-10-CM | POA: Diagnosis not present

## 2024-07-11 DIAGNOSIS — G309 Alzheimer's disease, unspecified: Secondary | ICD-10-CM | POA: Diagnosis not present

## 2024-07-13 DIAGNOSIS — I10 Essential (primary) hypertension: Secondary | ICD-10-CM | POA: Diagnosis not present

## 2024-07-13 DIAGNOSIS — M316 Other giant cell arteritis: Secondary | ICD-10-CM | POA: Diagnosis not present

## 2024-07-13 DIAGNOSIS — H8110 Benign paroxysmal vertigo, unspecified ear: Secondary | ICD-10-CM | POA: Diagnosis not present

## 2024-07-13 DIAGNOSIS — Z96641 Presence of right artificial hip joint: Secondary | ICD-10-CM | POA: Diagnosis not present

## 2024-07-13 DIAGNOSIS — M810 Age-related osteoporosis without current pathological fracture: Secondary | ICD-10-CM | POA: Diagnosis not present

## 2024-07-13 DIAGNOSIS — E039 Hypothyroidism, unspecified: Secondary | ICD-10-CM | POA: Diagnosis not present

## 2024-07-13 DIAGNOSIS — Z87891 Personal history of nicotine dependence: Secondary | ICD-10-CM | POA: Diagnosis not present

## 2024-07-13 DIAGNOSIS — Z7983 Long term (current) use of bisphosphonates: Secondary | ICD-10-CM | POA: Diagnosis not present

## 2024-07-13 DIAGNOSIS — J449 Chronic obstructive pulmonary disease, unspecified: Secondary | ICD-10-CM | POA: Diagnosis not present

## 2024-07-13 DIAGNOSIS — I951 Orthostatic hypotension: Secondary | ICD-10-CM | POA: Diagnosis not present

## 2024-07-13 DIAGNOSIS — K649 Unspecified hemorrhoids: Secondary | ICD-10-CM | POA: Diagnosis not present

## 2024-07-13 DIAGNOSIS — F0284 Dementia in other diseases classified elsewhere, unspecified severity, with anxiety: Secondary | ICD-10-CM | POA: Diagnosis not present

## 2024-07-13 DIAGNOSIS — G309 Alzheimer's disease, unspecified: Secondary | ICD-10-CM | POA: Diagnosis not present

## 2024-07-13 DIAGNOSIS — Z9181 History of falling: Secondary | ICD-10-CM | POA: Diagnosis not present

## 2024-07-13 DIAGNOSIS — H5442A3 Blindness left eye category 3, normal vision right eye: Secondary | ICD-10-CM | POA: Diagnosis not present

## 2024-07-13 DIAGNOSIS — Z8701 Personal history of pneumonia (recurrent): Secondary | ICD-10-CM | POA: Diagnosis not present

## 2024-07-13 DIAGNOSIS — G5603 Carpal tunnel syndrome, bilateral upper limbs: Secondary | ICD-10-CM | POA: Diagnosis not present

## 2024-07-13 DIAGNOSIS — I700 Atherosclerosis of aorta: Secondary | ICD-10-CM | POA: Diagnosis not present

## 2024-07-13 DIAGNOSIS — R918 Other nonspecific abnormal finding of lung field: Secondary | ICD-10-CM | POA: Diagnosis not present

## 2024-07-25 DIAGNOSIS — F0284 Dementia in other diseases classified elsewhere, unspecified severity, with anxiety: Secondary | ICD-10-CM | POA: Diagnosis not present

## 2024-07-25 DIAGNOSIS — J449 Chronic obstructive pulmonary disease, unspecified: Secondary | ICD-10-CM | POA: Diagnosis not present

## 2024-07-25 DIAGNOSIS — G5603 Carpal tunnel syndrome, bilateral upper limbs: Secondary | ICD-10-CM | POA: Diagnosis not present

## 2024-07-25 DIAGNOSIS — I10 Essential (primary) hypertension: Secondary | ICD-10-CM | POA: Diagnosis not present

## 2024-07-25 DIAGNOSIS — I700 Atherosclerosis of aorta: Secondary | ICD-10-CM | POA: Diagnosis not present

## 2024-07-25 DIAGNOSIS — G309 Alzheimer's disease, unspecified: Secondary | ICD-10-CM | POA: Diagnosis not present

## 2024-07-25 DIAGNOSIS — I951 Orthostatic hypotension: Secondary | ICD-10-CM | POA: Diagnosis not present
